# Patient Record
Sex: Male | Born: 1955 | Race: White | Hispanic: No | Marital: Married | Smoking: Never smoker
Health system: Southern US, Community
[De-identification: ages and names within clinical notes are randomized; demographics above are authoritative.]

## PROBLEM LIST (undated history)

## (undated) DIAGNOSIS — C801 Malignant (primary) neoplasm, unspecified: Secondary | ICD-10-CM

## (undated) DIAGNOSIS — I8390 Asymptomatic varicose veins of unspecified lower extremity: Secondary | ICD-10-CM

## (undated) DIAGNOSIS — E1139 Type 2 diabetes mellitus with other diabetic ophthalmic complication: Secondary | ICD-10-CM

## (undated) DIAGNOSIS — M6530 Trigger finger, unspecified finger: Secondary | ICD-10-CM

## (undated) DIAGNOSIS — E785 Hyperlipidemia, unspecified: Secondary | ICD-10-CM

## (undated) DIAGNOSIS — E109 Type 1 diabetes mellitus without complications: Secondary | ICD-10-CM

## (undated) DIAGNOSIS — B977 Papillomavirus as the cause of diseases classified elsewhere: Secondary | ICD-10-CM

## (undated) DIAGNOSIS — Z87891 Personal history of nicotine dependence: Secondary | ICD-10-CM

## (undated) DIAGNOSIS — K828 Other specified diseases of gallbladder: Secondary | ICD-10-CM

## (undated) HISTORY — DX: Type 1 diabetes mellitus without complications: E10.9

## (undated) HISTORY — DX: Other specified diseases of gallbladder: K82.8

## (undated) HISTORY — PX: SHOULDER SURGERY: SHX246

## (undated) HISTORY — DX: Personal history of nicotine dependence: Z87.891

## (undated) HISTORY — PX: CHOLECYSTECTOMY: SHX55

## (undated) HISTORY — PX: VITRECTOMY: SHX106

## (undated) HISTORY — DX: Hyperlipidemia, unspecified: E78.5

## (undated) HISTORY — DX: Asymptomatic varicose veins of unspecified lower extremity: I83.90

## (undated) HISTORY — DX: Papillomavirus as the cause of diseases classified elsewhere: B97.7

## (undated) HISTORY — DX: Malignant (primary) neoplasm, unspecified: C80.1

## (undated) HISTORY — PX: KNEE ARTHROSCOPY: SUR90

## (undated) HISTORY — DX: Trigger finger, unspecified finger: M65.30

## (undated) HISTORY — PX: VASECTOMY: SHX75

## (undated) HISTORY — DX: Type 2 diabetes mellitus with other diabetic ophthalmic complication: E11.39

---

## 1999-02-24 ENCOUNTER — Ambulatory Visit (HOSPITAL_COMMUNITY): Admission: RE | Admit: 1999-02-24 | Discharge: 1999-02-24 | Payer: Self-pay | Admitting: Gastroenterology

## 1999-02-24 ENCOUNTER — Encounter: Payer: Self-pay | Admitting: Gastroenterology

## 1999-07-24 ENCOUNTER — Ambulatory Visit (HOSPITAL_BASED_OUTPATIENT_CLINIC_OR_DEPARTMENT_OTHER): Admission: RE | Admit: 1999-07-24 | Discharge: 1999-07-24 | Payer: Self-pay | Admitting: Orthopaedic Surgery

## 2004-05-04 ENCOUNTER — Ambulatory Visit: Payer: Self-pay | Admitting: Endocrinology

## 2004-05-08 ENCOUNTER — Ambulatory Visit: Payer: Self-pay | Admitting: Endocrinology

## 2005-02-05 ENCOUNTER — Ambulatory Visit: Payer: Self-pay | Admitting: Endocrinology

## 2005-03-01 ENCOUNTER — Ambulatory Visit: Payer: Self-pay | Admitting: Internal Medicine

## 2005-03-14 ENCOUNTER — Emergency Department (HOSPITAL_COMMUNITY): Admission: EM | Admit: 2005-03-14 | Discharge: 2005-03-14 | Payer: Self-pay | Admitting: Family Medicine

## 2005-06-02 ENCOUNTER — Ambulatory Visit: Payer: Self-pay | Admitting: Internal Medicine

## 2005-06-11 ENCOUNTER — Ambulatory Visit: Payer: Self-pay | Admitting: Endocrinology

## 2005-07-19 ENCOUNTER — Ambulatory Visit: Payer: Self-pay | Admitting: Endocrinology

## 2006-02-25 ENCOUNTER — Ambulatory Visit: Payer: Self-pay | Admitting: Endocrinology

## 2006-06-03 ENCOUNTER — Ambulatory Visit: Payer: Self-pay | Admitting: Endocrinology

## 2006-06-03 LAB — CONVERTED CEMR LAB: Hgb A1c MFr Bld: 9.8 % — ABNORMAL HIGH (ref 4.6–6.0)

## 2006-07-12 ENCOUNTER — Ambulatory Visit: Payer: Self-pay | Admitting: Endocrinology

## 2006-08-30 ENCOUNTER — Ambulatory Visit: Payer: Self-pay | Admitting: Endocrinology

## 2006-08-30 LAB — CONVERTED CEMR LAB
ALT: 19 units/L (ref 0–53)
AST: 16 units/L (ref 0–37)
Albumin: 3.5 g/dL (ref 3.5–5.2)
Alkaline Phosphatase: 56 units/L (ref 39–117)
BUN: 12 mg/dL (ref 6–23)
Basophils Absolute: 0 10*3/uL (ref 0.0–0.1)
Basophils Relative: 0.5 % (ref 0.0–1.0)
Bilirubin Urine: NEGATIVE
Bilirubin, Direct: 0.2 mg/dL (ref 0.0–0.3)
CO2: 28 meq/L (ref 19–32)
Calcium: 8.7 mg/dL (ref 8.4–10.5)
Chloride: 108 meq/L (ref 96–112)
Cholesterol: 190 mg/dL (ref 0–200)
Creatinine, Ser: 0.8 mg/dL (ref 0.4–1.5)
Creatinine,U: 214.4 mg/dL
Eosinophils Absolute: 0.3 10*3/uL (ref 0.0–0.6)
Eosinophils Relative: 5.3 % — ABNORMAL HIGH (ref 0.0–5.0)
GFR calc Af Amer: 133 mL/min
GFR calc non Af Amer: 110 mL/min
Glucose, Bld: 198 mg/dL — ABNORMAL HIGH (ref 70–99)
HCT: 42.2 % (ref 39.0–52.0)
HDL: 55 mg/dL (ref >39.0)
Hemoglobin, Urine: NEGATIVE
Hemoglobin: 14.6 g/dL (ref 13.0–17.0)
Hgb A1c MFr Bld: 8.2 % — ABNORMAL HIGH (ref 4.6–6.0)
LDL Cholesterol: 127 mg/dL — ABNORMAL HIGH (ref 0–99)
Leukocytes, UA: NEGATIVE
Lymphocytes Relative: 32.4 % (ref 12.0–46.0)
MCHC: 34.6 g/dL (ref 30.0–36.0)
MCV: 90.9 fL (ref 78.0–100.0)
Microalb Creat Ratio: 2.8 mg/g (ref 0.0–30.0)
Microalb, Ur: 0.6 mg/dL (ref 0.0–1.9)
Monocytes Absolute: 0.4 10*3/uL (ref 0.2–0.7)
Monocytes Relative: 7 % (ref 3.0–11.0)
Neutro Abs: 3.3 10*3/uL (ref 1.4–7.7)
Neutrophils Relative %: 54.8 % (ref 43.0–77.0)
Nitrite: NEGATIVE
PSA: 1.48 ng/mL (ref 0.10–4.00)
Platelets: 270 10*3/uL (ref 150–400)
Potassium: 3.6 meq/L (ref 3.5–5.1)
RBC: 4.64 M/uL (ref 4.22–5.81)
RDW: 12.8 % (ref 11.5–14.6)
Sodium: 141 meq/L (ref 135–145)
Specific Gravity, Urine: 1.025 (ref 1.000–1.03)
TSH: 1.33 microintl units/mL (ref 0.35–5.50)
Total Bilirubin: 1.1 mg/dL (ref 0.3–1.2)
Total CHOL/HDL Ratio: 3.5
Total Protein, Urine: NEGATIVE mg/dL
Total Protein: 6.2 g/dL (ref 6.0–8.3)
Triglycerides: 40 mg/dL (ref 0–149)
Urine Glucose: >=1000 mg/dL — CR
Urobilinogen, UA: 0.2 (ref 0.0–1.0)
VLDL: 8 mg/dL (ref 0–40)
WBC: 5.9 10*3/uL (ref 4.5–10.5)
pH: 6 (ref 5.0–8.0)

## 2006-09-21 ENCOUNTER — Ambulatory Visit: Payer: Self-pay | Admitting: Endocrinology

## 2006-11-17 ENCOUNTER — Encounter: Payer: Self-pay | Admitting: Endocrinology

## 2006-11-17 DIAGNOSIS — B977 Papillomavirus as the cause of diseases classified elsewhere: Secondary | ICD-10-CM

## 2006-11-17 DIAGNOSIS — K828 Other specified diseases of gallbladder: Secondary | ICD-10-CM

## 2006-11-17 DIAGNOSIS — Z87891 Personal history of nicotine dependence: Secondary | ICD-10-CM

## 2006-11-17 DIAGNOSIS — I8390 Asymptomatic varicose veins of unspecified lower extremity: Secondary | ICD-10-CM

## 2006-11-17 DIAGNOSIS — Z9889 Other specified postprocedural states: Secondary | ICD-10-CM | POA: Insufficient documentation

## 2006-11-17 DIAGNOSIS — E1139 Type 2 diabetes mellitus with other diabetic ophthalmic complication: Secondary | ICD-10-CM

## 2006-11-17 DIAGNOSIS — E785 Hyperlipidemia, unspecified: Secondary | ICD-10-CM | POA: Insufficient documentation

## 2006-11-17 DIAGNOSIS — E109 Type 1 diabetes mellitus without complications: Secondary | ICD-10-CM

## 2006-11-17 HISTORY — DX: Asymptomatic varicose veins of unspecified lower extremity: I83.90

## 2006-11-17 HISTORY — DX: Other specified diseases of gallbladder: K82.8

## 2006-11-17 HISTORY — DX: Papillomavirus as the cause of diseases classified elsewhere: B97.7

## 2006-11-17 HISTORY — DX: Type 2 diabetes mellitus with other diabetic ophthalmic complication: E11.39

## 2006-11-17 HISTORY — DX: Hyperlipidemia, unspecified: E78.5

## 2006-11-17 HISTORY — DX: Type 1 diabetes mellitus without complications: E10.9

## 2006-11-17 HISTORY — DX: Personal history of nicotine dependence: Z87.891

## 2006-11-18 ENCOUNTER — Ambulatory Visit: Payer: Self-pay | Admitting: Endocrinology

## 2006-11-18 LAB — CONVERTED CEMR LAB
ALT: 16 units/L (ref 0–53)
AST: 17 units/L (ref 0–37)
Albumin: 3.7 g/dL (ref 3.5–5.2)
Alkaline Phosphatase: 52 units/L (ref 39–117)
Bilirubin, Direct: 0.2 mg/dL (ref 0.0–0.3)
Cholesterol: 140 mg/dL (ref 0–200)
HDL: 54.7 mg/dL (ref >39.0)
Hgb A1c MFr Bld: 7.9 % — ABNORMAL HIGH (ref 4.6–6.0)
LDL Cholesterol: 80 mg/dL (ref 0–99)
Total Bilirubin: 0.9 mg/dL (ref 0.3–1.2)
Total CHOL/HDL Ratio: 2.6
Total Protein: 6.6 g/dL (ref 6.0–8.3)
Triglycerides: 28 mg/dL (ref 0–149)
VLDL: 6 mg/dL (ref 0–40)

## 2006-11-22 LAB — CONVERTED CEMR LAB: Hep B S Ab: NEGATIVE

## 2006-11-23 ENCOUNTER — Telehealth (INDEPENDENT_AMBULATORY_CARE_PROVIDER_SITE_OTHER): Payer: Self-pay | Admitting: *Deleted

## 2006-11-23 ENCOUNTER — Encounter: Payer: Self-pay | Admitting: Endocrinology

## 2007-01-03 ENCOUNTER — Telehealth: Payer: Self-pay | Admitting: Internal Medicine

## 2007-01-19 HISTORY — PX: VITRECTOMY: SHX106

## 2007-03-03 ENCOUNTER — Encounter: Payer: Self-pay | Admitting: Endocrinology

## 2007-03-17 ENCOUNTER — Telehealth (INDEPENDENT_AMBULATORY_CARE_PROVIDER_SITE_OTHER): Payer: Self-pay | Admitting: *Deleted

## 2007-05-19 ENCOUNTER — Ambulatory Visit: Payer: Self-pay | Admitting: Endocrinology

## 2007-05-19 LAB — CONVERTED CEMR LAB: Hgb A1c MFr Bld: 8.5 % — ABNORMAL HIGH (ref 4.6–6.0)

## 2007-07-03 ENCOUNTER — Telehealth: Payer: Self-pay | Admitting: Endocrinology

## 2007-07-19 ENCOUNTER — Telehealth (INDEPENDENT_AMBULATORY_CARE_PROVIDER_SITE_OTHER): Payer: Self-pay | Admitting: *Deleted

## 2007-08-14 ENCOUNTER — Ambulatory Visit: Payer: Self-pay | Admitting: Endocrinology

## 2007-08-18 LAB — CONVERTED CEMR LAB: Hgb A1c MFr Bld: 8.1 % — ABNORMAL HIGH (ref 4.6–6.0)

## 2007-08-21 ENCOUNTER — Ambulatory Visit: Payer: Self-pay | Admitting: Endocrinology

## 2007-10-04 ENCOUNTER — Encounter: Payer: Self-pay | Admitting: Endocrinology

## 2007-10-06 ENCOUNTER — Telehealth: Payer: Self-pay | Admitting: Endocrinology

## 2007-11-10 ENCOUNTER — Ambulatory Visit: Payer: Self-pay | Admitting: Endocrinology

## 2007-11-20 ENCOUNTER — Encounter: Payer: Self-pay | Admitting: Endocrinology

## 2007-11-23 ENCOUNTER — Encounter: Payer: Self-pay | Admitting: Endocrinology

## 2007-12-20 ENCOUNTER — Encounter: Payer: Self-pay | Admitting: Endocrinology

## 2007-12-22 ENCOUNTER — Ambulatory Visit: Payer: Self-pay | Admitting: Endocrinology

## 2007-12-22 LAB — CONVERTED CEMR LAB: Hgb A1c MFr Bld: 8.4 % — ABNORMAL HIGH (ref 4.6–6.0)

## 2008-06-18 ENCOUNTER — Ambulatory Visit: Payer: Self-pay | Admitting: Endocrinology

## 2008-06-18 LAB — CONVERTED CEMR LAB
Creatinine,U: 203.9 mg/dL
Hgb A1c MFr Bld: 8.3 % — ABNORMAL HIGH (ref 4.6–6.5)
Microalb Creat Ratio: 2.5 mg/g (ref 0.0–30.0)
Microalb, Ur: 0.5 mg/dL (ref 0.0–1.9)

## 2008-06-24 ENCOUNTER — Ambulatory Visit: Payer: Self-pay | Admitting: Endocrinology

## 2008-07-29 ENCOUNTER — Telehealth: Payer: Self-pay | Admitting: Endocrinology

## 2008-11-07 ENCOUNTER — Encounter: Payer: Self-pay | Admitting: Endocrinology

## 2009-01-27 ENCOUNTER — Telehealth: Payer: Self-pay | Admitting: Endocrinology

## 2009-02-21 ENCOUNTER — Ambulatory Visit: Payer: Self-pay | Admitting: Endocrinology

## 2009-02-21 ENCOUNTER — Telehealth (INDEPENDENT_AMBULATORY_CARE_PROVIDER_SITE_OTHER): Payer: Self-pay | Admitting: *Deleted

## 2009-02-24 ENCOUNTER — Telehealth (INDEPENDENT_AMBULATORY_CARE_PROVIDER_SITE_OTHER): Payer: Self-pay | Admitting: *Deleted

## 2009-04-07 ENCOUNTER — Encounter (INDEPENDENT_AMBULATORY_CARE_PROVIDER_SITE_OTHER): Payer: Self-pay | Admitting: *Deleted

## 2009-05-27 ENCOUNTER — Encounter (INDEPENDENT_AMBULATORY_CARE_PROVIDER_SITE_OTHER): Payer: Self-pay | Admitting: *Deleted

## 2009-05-29 ENCOUNTER — Encounter (INDEPENDENT_AMBULATORY_CARE_PROVIDER_SITE_OTHER): Payer: Self-pay | Admitting: *Deleted

## 2009-05-29 ENCOUNTER — Ambulatory Visit: Payer: Self-pay | Admitting: Gastroenterology

## 2009-06-10 ENCOUNTER — Ambulatory Visit: Payer: Self-pay | Admitting: Gastroenterology

## 2009-06-10 LAB — HM COLONOSCOPY

## 2009-06-12 ENCOUNTER — Encounter: Payer: Self-pay | Admitting: Gastroenterology

## 2009-06-23 ENCOUNTER — Ambulatory Visit: Payer: Self-pay | Admitting: Endocrinology

## 2009-10-09 ENCOUNTER — Encounter: Payer: Self-pay | Admitting: Endocrinology

## 2009-10-10 ENCOUNTER — Ambulatory Visit: Payer: Self-pay | Admitting: Endocrinology

## 2009-10-10 LAB — CONVERTED CEMR LAB: Hgb A1c MFr Bld: 8.5 % — ABNORMAL HIGH (ref 4.6–6.5)

## 2009-10-15 ENCOUNTER — Telehealth (INDEPENDENT_AMBULATORY_CARE_PROVIDER_SITE_OTHER): Payer: Self-pay | Admitting: *Deleted

## 2009-10-16 ENCOUNTER — Encounter: Payer: Self-pay | Admitting: Endocrinology

## 2009-10-17 ENCOUNTER — Telehealth: Payer: Self-pay | Admitting: Endocrinology

## 2009-12-23 ENCOUNTER — Telehealth: Payer: Self-pay | Admitting: Endocrinology

## 2010-01-06 ENCOUNTER — Telehealth: Payer: Self-pay | Admitting: Endocrinology

## 2010-01-14 ENCOUNTER — Telehealth: Payer: Self-pay | Admitting: Endocrinology

## 2010-02-13 ENCOUNTER — Ambulatory Visit: Admit: 2010-02-13 | Payer: Self-pay | Admitting: Endocrinology

## 2010-02-15 LAB — CONVERTED CEMR LAB
ALT: 15 units/L (ref 0–53)
AST: 15 units/L (ref 0–37)
Albumin: 3.8 g/dL (ref 3.5–5.2)
Alkaline Phosphatase: 68 units/L (ref 39–117)
BUN: 16 mg/dL (ref 6–23)
Basophils Absolute: 0 10*3/uL (ref 0.0–0.1)
Basophils Relative: 0.9 % (ref 0.0–3.0)
Bilirubin Urine: NEGATIVE
Bilirubin, Direct: 0.2 mg/dL (ref 0.0–0.3)
CO2: 32 meq/L (ref 19–32)
Calcium: 9 mg/dL (ref 8.4–10.5)
Chloride: 107 meq/L (ref 96–112)
Cholesterol: 203 mg/dL — ABNORMAL HIGH (ref 0–200)
Creatinine, Ser: 0.8 mg/dL (ref 0.4–1.5)
Direct LDL: 126.3 mg/dL
Eosinophils Absolute: 0.2 10*3/uL (ref 0.0–0.7)
Eosinophils Relative: 4.4 % (ref 0.0–5.0)
GFR calc non Af Amer: 108.18 mL/min (ref >60)
Glucose, Bld: 150 mg/dL — ABNORMAL HIGH (ref 70–99)
HCT: 46.3 % (ref 39.0–52.0)
HDL: 67 mg/dL (ref >39.00)
Hemoglobin, Urine: NEGATIVE
Hemoglobin: 15.2 g/dL (ref 13.0–17.0)
Hgb A1c MFr Bld: 8.6 % — ABNORMAL HIGH (ref 4.6–6.5)
Ketones, ur: NEGATIVE mg/dL
Leukocytes, UA: NEGATIVE
Lymphocytes Relative: 34 % (ref 12.0–46.0)
Lymphs Abs: 1.8 10*3/uL (ref 0.7–4.0)
MCHC: 32.8 g/dL (ref 30.0–36.0)
MCV: 95.4 fL (ref 78.0–100.0)
Monocytes Absolute: 0.4 10*3/uL (ref 0.1–1.0)
Monocytes Relative: 7.2 % (ref 3.0–12.0)
Neutro Abs: 2.8 10*3/uL (ref 1.4–7.7)
Neutrophils Relative %: 53.5 % (ref 43.0–77.0)
Nitrite: NEGATIVE
PSA: 2.49 ng/mL (ref 0.10–4.00)
Platelets: 235 10*3/uL (ref 150.0–400.0)
Potassium: 4.3 meq/L (ref 3.5–5.1)
RBC: 4.86 M/uL (ref 4.22–5.81)
RDW: 12.8 % (ref 11.5–14.6)
Sodium: 144 meq/L (ref 135–145)
Specific Gravity, Urine: 1.015 (ref 1.000–1.030)
TSH: 1.34 microintl units/mL (ref 0.35–5.50)
Total Bilirubin: 1.1 mg/dL (ref 0.3–1.2)
Total CHOL/HDL Ratio: 3
Total Protein: 6.7 g/dL (ref 6.0–8.3)
Triglycerides: 49 mg/dL (ref 0.0–149.0)
Urine Glucose: 500 mg/dL
Urobilinogen, UA: 0.2 (ref 0.0–1.0)
VLDL: 9.8 mg/dL (ref 0.0–40.0)
WBC: 5.2 10*3/uL (ref 4.5–10.5)
pH: 8.5 (ref 5.0–8.0)

## 2010-02-19 NOTE — Letter (Signed)
Summary: Patient Notice- Polyp Results  [facility]  [address] [place], [place] [address]   Phone: [phone]  Fax: [phone]        Jun 12, 2009 MRN: [id number]    UNDREA SHIPES [address] [place], [place]  [address]    Dear Jonny Ruiz,  I am pleased to inform you that the colon polyp(s) removed during your recent colonoscopy was (were) found to be benign (no cancer detected) upon pathologic examination.  I recommend you have a repeat colonoscopy examination in 5 years to look for recurrent polyps, as having colon polyps increases your risk for having recurrent polyps or even colon cancer in the future.  Should you develop new or worsening symptoms of abdominal pain, bowel habit changes or bleeding from the rectum or bowels, please schedule an evaluation with either your primary care physician or with me.  Continue treatment plan as outlined the day of your exam.  Please call us if you are having persistent problems or have questions about your condition that have not been fully answered at this time.  Sincerely,  Meryl Dare MD [facility]  This letter has been electronically signed by your physician.  Appended Document: Patient Notice- Polyp Results letter mailed.

## 2010-02-19 NOTE — Progress Notes (Signed)
Summary: rx refil req  Phone Note Call from Patient Call back at Work Phone [phone]   Caller: Patient Reason for Call: Refill Medication Summary of Call: Pt is requesting rx for Humalog be sent to [facility]-3 month supply-pt is running out of samples Initial call taken by: Brenton Grills CMA Duncan Dull),  December 23, 2009 11:59 AM  Follow-up for Phone Call        pt informed  Follow-up by: Brenton Grills CMA Duncan Dull),  December 23, 2009 12:03 PM    Prescriptions: HUMALOG 100 UNIT/ML SOLN (INSULIN LISPRO (HUMAN)) 5 units three times a day (qac)  #3 month x 1   Entered by:   Brenton Grills CMA (AAMA)   Authorized by:   Minus Breeding MD   Signed by:   Brenton Grills CMA (AAMA) on 12/23/2009   Method used:   Electronically to        [facility]* (retail)       [address] [place], [place]  [address]       Ph: [phone]       Fax: [phone]   RxID:   [id number]

## 2010-02-19 NOTE — Medication Information (Signed)
Summary: Prior auth & denied for Pravastatin/Humana  Prior auth & denied for Pravastatin/Humana   Imported By: Sherian Rein 10/21/2009 08:09:54  _____________________________________________________________________  External Attachment:    Type:   Image     Comment:   External Document

## 2010-02-19 NOTE — Letter (Signed)
Summary: Diabetic Instructions  [facility]  [address] [place], [place] [address]   Phone: [phone]  Fax: [phone]    Donald Lopez [birth year] MRN: [id number]     ________________________________________________________________________  _  _   INSULIN (LONG ACTING) MEDICATION INSTRUCTIONS (Lantus, NPH, 70/30, Humulin, Novolin-N)   The day before your procedure:   Take  your regular evening dose    The day of your procedure:   Do not take your morning dose    _  _   INSULIN (SHORT ACTING) MEDICATION INSTRUCTIONS (Regular, Humulog, Novolog)   The day before your procedure:   Do not take your evening dose   The day of your procedure:   Do not take your morning dose

## 2010-02-19 NOTE — Assessment & Plan Note (Signed)
Summary: 3 MTH FU  STC   Vital Signs:  Patient profile:   55 year old male Height:      68 inches (172.72 cm) Weight:      181.8 pounds (82.64 kg) BMI:     27.74 O2 Sat:      97 % on Room air Temp:     97.5 degrees F (36.39 degrees C) oral Pulse rate:   94 / minute BP sitting:   118 / 68  (left arm) Cuff size:   regular  Vitals Entered By: Orlan Leavens (June 23, 2009 8:14 AM)  O2 Flow:  Room air CC: 3 month follow-up Is Patient Diabetic? Yes Did you bring your meter with you today? No Pain Assessment Patient in pain? no        CC:  3 month follow-up.  History of Present Illness: no cbg record, but states he has to eat hs-snack in order to prevent nocturnal hypoglycemia.  pt states he feels well in general.   he does not want to do a1c now, due to cost.  he says he will pursue continuous glucose monitor soon.  Current Medications (verified): 1)  Humalog 100 Unit/ml Soln (Insulin Lispro (Human)) .... 5 Units Three Times A Day (Qac) 2)  Accu-Chek Aviva   Strp (Glucose Blood) .... Use To Check Blood Sugars 5 Times A Day, and Lancets 250.03 3)  Aspirin 325 Mg Tabs (Aspirin) .... Take 1 By Mouth Qd 4)  Lantus 100 Unit/ml Soln (Insulin Glargine) .Marland Kitchen.. 18 Units Qhs  Allergies (verified): 1)  ! 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    Physical Exam  General:  normal appearance.   Pulses:  dorsalis pedis intact bilat.    Extremities:  no deformity.  no ulcer on the feet.  feet are of normal color and temp.  at the left ankle, there is rust-color, and varicosities.  trace left pedal edema (none on the right).   Neurologic:   sensation is intact to touch on the feet    Impression & Recommendations:  Problem # 1:  DIABETES MELLITUS, TYPE I (ICD-250.01) uncertain control.  Medications Added to Medication List This Visit: 1)  Accu-chek Aviva Strp (Glucose blood) .... 5 times a day, and lancets 250.03 2)  Lantus 100 Unit/ml Soln (Insulin glargine) .Marland KitchenMarland KitchenMarland Kitchen 17 units at bedtime  Other Orders: Est. Patient Level III ([address])  Patient Instructions: 1)  reduce the lantus to 17 units at bedtime.   2)  return 3 months. Prescriptions: ACCU-CHEK AVIVA   STRP (GLUCOSE BLOOD) 5 times a day, and lancets 250.03  #450 x 3   Entered and Authorized by:   Minus Breeding MD   Signed by:   Minus Breeding MD on 06/23/2009   Method used:   Print then Give to Patient   RxID:   [id number]

## 2010-02-19 NOTE — Assessment & Plan Note (Signed)
Summary: 3 MO FU-OYU  #   Vital Signs:  Patient profile:   55 year old male Height:      68 inches (172.72 cm) Weight:      191 pounds (86.82 kg) BMI:     29.15 O2 Sat:      97 % on Room air Temp:     97.6 degrees F (36.44 degrees C) oral Pulse rate:   70 / minute BP sitting:   110 / 70  (left arm) Cuff size:   large  Vitals Entered By: Brenton Grills MA (October 10, 2009 8:58 AM)  O2 Flow:  Room air CC: 3 month F/U/aj Is Patient Diabetic? Yes   CC:  3 month F/U/aj.  History of Present Illness: no cbg record, but states he has to eat hs-snack in order to prevent nocturnal hypoglycemia.  pt states he feels well in general.   he does not want to do a1c now, due to cost.  he says he will pursue continuous glucose monitor soon.    Current Medications (verified): 1)  Humalog 100 Unit/ml Soln (Insulin Lispro (Human)) .... 5 Units Three Times A Day (Qac) 2)  Accu-Chek Aviva   Strp (Glucose Blood) .... 5 Times A Day, and Lancets 250.03 3)  Aspirin 325 Mg Tabs (Aspirin) .... Take 1 By Mouth Qd 4)  Lantus 100 Unit/ml Soln (Insulin Glargine) .Marland KitchenMarland Kitchen. 17 Units At Bedtime  Allergies (verified): 1)  ! 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    Physical Exam  Pulses:  dorsalis pedis intact bilat.    Extremities:  no deformity.  no ulcer on the feet.  feet are of normal color and temp.  at the left ankle, there is rust-color, and varicosities.  trace left pedal edema (none on the right).   Neurologic:  sensation is intact to touch on the feet  Additional Exam:  Hemoglobin A1C       [H]  8.5 %        Impression &  Recommendations:  Problem # 1:  DIABETES MELLITUS, TYPE I (ICD-250.01) needs increased rx  Problem # 2:  HYPERLIPIDEMIA (ICD-272.4) needs increased rx  LDL: 80 (11/18/2006)     Medications Added to Medication List This Visit: 1)  Pravastatin Sodium 40 Mg Tabs (Pravastatin sodium) .... 2 tabs once daily 2)  Bd Insulin Syringe Ultrafine 30g X 1/2" 0.3 Ml Misc (Insulin syringe-needle u-100) .... 4x a day  Other Orders: Admin 1st Vaccine ([address]) Flu Vaccine 2yrs + ([address]) Diabetic Clinic Referral (Diabetic) TLB-A1C / Hgb A1C (Glycohemoglobin) (83036-A1C) Est. Patient Level III ([address])  Patient Instructions: 1)  take pravastatin 2x40 mg once daily. 2)  refer diabetes educator, to consider continuous monitor.  you will be called with a day and time for an appointment. 3)  Please schedule a follow-up appointment in 4 months, or when you have the continuous monitor. 4)  blood tests are being ordered for you today.  please call [phone] to hear your test results. 5)  (update: i left message on phone-tree:  rx as we discussed) Prescriptions: PRAVASTATIN SODIUM 40 MG TABS (PRAVASTATIN SODIUM) 2 tabs once daily  #180 x 3   Entered and Authorized by:   Minus Breeding MD  Signed by:   Minus Breeding MD on 10/10/2009   Method used:   Print then Give to Patient   RxID:   [id number] BD INSULIN SYRINGE ULTRAFINE 30G X 1/2" 0.3 ML MISC (INSULIN SYRINGE-NEEDLE U-100) 4x a day  #120 x 11   Entered and Authorized by:   Minus Breeding MD   Signed by:   Minus Breeding MD on 10/10/2009   Method used:   Electronically to        [facility]* (retail)       [address]       [place], [place]  [address]       Ph: [phone]       Fax: [phone]   RxID:   [id number] PRAVASTATIN SODIUM 40 MG TABS (PRAVASTATIN SODIUM) 2 tabs once daily  #180 x 3   Entered and Authorized by:   Minus Breeding MD   Signed by:   Minus Breeding MD on 10/10/2009   Method used:    Electronically to        [facility]* (retail)       [address] [place], [place]  [address]       Ph: [phone]       Fax: [phone]   RxID:   [id number]   Flu Vaccine Consent Questions     Do you have a history of severe allergic reactions to this vaccine? no    Any prior history of allergic reactions to egg and/or gelatin? no    Do you have a sensitivity to the preservative Thimersol? no    Do you have a past history of Guillan-Barre Syndrome? no    Do you currently have an acute febrile illness? no    Have you ever had a severe reaction to latex? no    Vaccine information given and explained to patient? yes    Are you currently pregnant? no    Lot Number:[id number]   Exp Date:07/18/2010   Site Given  Right Deltoid IMlu

## 2010-02-19 NOTE — Progress Notes (Signed)
Summary: Rx clarification  Phone Note Call from Patient Call back at Home Phone [phone]   Caller: Patient Summary of Call: Pt called stating that mail order pharmacy is holding his Rx until MD clarifies: Pravastain 40mg  two times a day, once daily or 80mg  once daily. Please advise Initial call taken by: Margaret Pyle, CMA,  October 17, 2009 1:12 PM  Follow-up for Phone Call        whichever is cheaper Follow-up by: Minus Breeding MD,  October 17, 2009 1:23 PM  Additional Follow-up for Phone Call Additional follow up Details #1::        Per pt all generics are the same price, please advise 40mg  once daily, two times a day or 80mg  once daily  Additional Follow-up by: Margaret Pyle, CMA,  October 17, 2009 1:33 PM    Additional Follow-up for Phone Call Additional follow up Details #2::    the 40 mg is a $4 at [employer], with or without insurance.  is the generic copay less than $8? Follow-up by: Minus Breeding MD,  October 17, 2009 1:40 PM  Additional Follow-up for Phone Call Additional follow up Details #3:: Details for Additional Follow-up Action Taken: Pt gets his Rx's from [facility] and per pt he was advised that all 3 options they listed were the same price. Pharmacy is requesting clarification on dosage and frequency. Margaret Pyle, CMA  October 17, 2009 1:56 PM   New/Updated Medications: PRAVASTATIN SODIUM 80 MG TABS (PRAVASTATIN SODIUM) 1 tab at bedtime Prescriptions: PRAVASTATIN SODIUM 80 MG TABS (PRAVASTATIN SODIUM) 1 tab at bedtime  #90 x 3   Entered and Authorized by:   Minus Breeding MD   Signed by:   Minus Breeding MD on 10/17/2009   Method used:   Electronically to        [facility]* (retail)       [address] [place], [place]  [address]       Ph: [phone]       Fax: [phone]   RxID:   [id number]  i sent rx sean ellison, md  Pt informed. Margaret Pyle, CMA  October 17, 2009 2:21 PM

## 2010-02-19 NOTE — Miscellaneous (Signed)
Summary: [facility]  Clinical Lists Changes  Medications: Added new medication of MOVIPREP 100 GM  SOLR (PEG-KCL-NACL-NASULF-NA ASC-C) As per prep instructions. - Signed Rx of MOVIPREP 100 GM  SOLR (PEG-KCL-NACL-NASULF-NA ASC-C) As per prep instructions.;  #1 x 0;  Signed;  Entered by: Ezra Sites RN;  Authorized by: Meryl Dare MD Clementeen Graham;  Method used: Electronically to [facility]*, [address], [place], [place]  [address], Ph: [phone], Fax: [phone] Allergies: Added new allergy or adverse reaction of PCN Observations: Added new observation of NKA: F (05/29/2009 7:56)    Prescriptions: MOVIPREP 100 GM  SOLR (PEG-KCL-NACL-NASULF-NA ASC-C) As per prep instructions.  #1 x 0   Entered by:   Ezra Sites RN   Authorized by:   Meryl Dare MD [facility]   Signed by:   Ezra Sites RN on 05/29/2009   Method used:   Electronically to        [facility]* (retail)       [address]       [place], [place]  [address]       Ph: [phone]       Fax: [phone]   RxID:   [id number]

## 2010-02-19 NOTE — Consult Note (Signed)
Summary: [facility]   Imported By: Sherian Rein 10/15/2009 14:28:17  _____________________________________________________________________  External Attachment:    Type:   Image     Comment:   External Document

## 2010-02-19 NOTE — Procedures (Signed)
Summary: Colonoscopy  Patient: Donald Lopez Note: All result statuses are Final unless otherwise noted.  Tests: (1) Colonoscopy (COL)   COL Colonoscopy           DONE     [facility]     [address]     [place], [place]  [address]           COLONOSCOPY PROCEDURE REPORT           PATIENT:  [name], [name]  MR#:  [id number]     BIRTHDATE:  1955/05/12, 53 yrs. old  GENDER:  male     ENDOSCOPIST:  Judie Petit T. Russella Dar, MD, [facility]           PROCEDURE DATE:  06/10/2009     PROCEDURE:  Colonoscopy with biopsy and snare polypectomy     ASA CLASS:  Class II     INDICATIONS:  1) Routine Risk Screening     MEDICATIONS:   Fentanyl 75 mcg IV, Versed 8 mg IV     DESCRIPTION OF PROCEDURE:   After the risks benefits and     alternatives of the procedure were thoroughly explained, informed     consent was obtained.  Digital rectal exam was performed and     revealed no abnormalities.   The LB [id number] endoscope     was introduced through the anus and advanced to the cecum, which     was identified by both the appendix and ileocecal valve, without     limitations.  The quality of the prep was good, using MoviPrep.     The instrument was then slowly withdrawn as the colon was fully     examined.     <<PROCEDUREIMAGES>>     FINDINGS:  A sessile polyp was found in the cecum. It was 4 mm in     size. The polyp was removed using cold biopsy forceps.  A sessile     polyp was found in the sigmoid colon. It was 4 mm in size. The     polyp was removed using cold biopsy forceps.  A sessile polyp was     found in the sigmoid colon. It was 5 mm in size. Polyp was snared     without cautery. Retrieval was successful. A normal appearing     ileocecal valve, and appendiceal orifice were identified. The     ascending, hepatic flexure, transverse, splenic flexure,     descending, and rectum appeared unremarkable. Retroflexed views in     the rectum revealed no abnormalities. The time to  cecum =  3.25     minutes. The scope was then withdrawn (time =  13.75  min) from the     patient and the procedure completed.     COMPLICATIONS:  None           ENDOSCOPIC IMPRESSION:     1) 4 mm sessile polyp in the cecum     2) 4 mm sessile polyp in the sigmoid colon     3) 5 mm sessile polyp in the sigmoid colon     RECOMMENDATIONS:     1) Await pathology results     2) If the polyps removed today are adenomatous (pre-cancerous),     you will need a repeat colonoscopy in 5 years. Otherwise you     should continue to follow colorectal cancer screening guidelines     for "routine risk" patients with colonoscopy in 10 years.  Venita Lick. Russella Dar, MD, Clementeen Graham           CC: Minus Breeding, MD           n.     Rosalie DoctorVenita Lick. Stark at 06/10/2009 09:30 AM           Michelene Heady, [id number]  Note: An exclamation mark (!) indicates a result that was not dispersed into the flowsheet. Document Creation Date: 06/10/2009 9:31 AM _______________________________________________________________________  (1) Order result status: Final Collection or observation date-time: 06/10/2009 09:25 Requested date-time:  Receipt date-time:  Reported date-time:  Referring Physician:   Ordering Physician: Claudette Head [id number]) Specimen Source:  Source: Launa Grill Order Number: [id number] Lab site:   Appended Document: Colonoscopy     Procedures Next Due Date:    Colonoscopy: 05/2014

## 2010-02-19 NOTE — Progress Notes (Signed)
  Phone Note Refill Request Message from:  Patient on January 06, 2010 4:55 PM  Refills Requested: Medication #1:  HUMALOG 100 UNIT/ML SOLN 5 units three times a day (qac)   Dosage confirmed as above?Dosage Confirmed   Supply Requested: 3 months  Method Requested: Fax to [facility] Initial call taken by: Margaret Pyle, CMA,  January 06, 2010 4:55 PM    Prescriptions: HUMALOG 100 UNIT/ML SOLN (INSULIN LISPRO (HUMAN)) 5 units three times a day (qac)  #50month x 1   Entered by:   Margaret Pyle, CMA   Authorized by:   Minus Breeding MD   Signed by:   Margaret Pyle, CMA on 01/06/2010   Method used:   Faxed to ...       [facility]* (retail)       [address] [place], [place]  [address]       Ph: [phone]       Fax: [phone]   RxID:   [id number]

## 2010-02-19 NOTE — Progress Notes (Signed)
Summary: Referral  ---- Converted from flag ---- ---- 02/22/2009 1:58 PM, Minus Breeding MD wrote: please call patient: i reviewed paper chart, and you are due for colonoscopy. ------------------------------  Phone Note Outgoing Call   Summary of Call: Informed pt. He asked that MD do a referral for him to get his colonoscopy. Please advise? Initial call taken by: Josph Macho CMA,  February 24, 2009 8:41 AM  Follow-up for Phone Call        done Follow-up by: Minus Breeding MD,  February 24, 2009 8:44 AM  Additional Follow-up for Phone Call Additional follow up Details #1::        Informed pt that someone from [facility] will be contacting him. Additional Follow-up by: Josph Macho CMA,  February 24, 2009 8:49 AM

## 2010-02-19 NOTE — Letter (Signed)
Summary: Previsit letter  [facility]  [address] [place], [place] [address]   Phone: [phone]  Fax: [phone]       04/07/2009 MRN: [id number]  CARLIN ATTRIDGE [address] [place], [place]  [address]  Dear Mr. Cleckley,  Welcome to the Gastroenterology Division at [facility].    You are scheduled to see a nurse for your pre-procedure visit on 05/07/2009 at 8:00AM on the 3rd floor at [facility], [address]  We ask that you try to arrive at our office 15 minutes prior to your appointment time to allow for check-in.  Your nurse visit will consist of discussing your medical and surgical history, your immediate family medical history, and your medications.    Please bring a complete list of all your medications or, if you prefer, bring the medication bottles and we will list them.  We will need to be aware of both prescribed and over the counter drugs.  We will need to know exact dosage information as well.  If you are on blood thinners (Coumadin, Plavix, Aggrenox, Ticlid, etc.) please call our office today/prior to your appointment, as we need to consult with your physician about holding your medication.   Please be prepared to read and sign documents such as consent forms, a financial agreement, and acknowledgement forms.  If necessary, and with your consent, a friend or relative is welcome to sit-in on the nurse visit with you.  Please bring your insurance card so that we may make a copy of it.  If your insurance requires a referral to see a specialist, please bring your referral form from your primary care physician.  No co-pay is required for this nurse visit.     If you cannot keep your appointment, please call [phone] to cancel or reschedule prior to your appointment date.  This allows [place] the opportunity to schedule an appointment for another patient in need of care.    Thank you for choosing [facility] for your  medical needs.  We appreciate the opportunity to care for you.  Please visit [place] at our website  to learn more about our practice.                     Sincerely.                                                                                                                   The Gastroenterology Division

## 2010-02-19 NOTE — Progress Notes (Signed)
Summary: Rx refill req  Phone Note Refill Request Message from:  Patient on January 14, 2010 9:03 AM  Refills Requested: Medication #1:  LANTUS 100 UNIT/ML SOLN 17 units at bedtime   Dosage confirmed as above?Dosage Confirmed   Supply Requested: 6 months  Method Requested: Fax to [facility] Initial call taken by: Margaret Pyle, CMA,  January 14, 2010 9:04 AM    Prescriptions: LANTUS 100 UNIT/ML SOLN (INSULIN GLARGINE) 17 units at bedtime  #29mth x 2   Entered by:   Margaret Pyle, CMA   Authorized by:   Minus Breeding MD   Signed by:   Margaret Pyle, CMA on 01/14/2010   Method used:   Electronically to        [facility]* (retail)       [address] [place], [place]  [address]       Ph: [phone]       Fax: [phone]   RxID:   [id number] LANTUS 100 UNIT/ML SOLN (INSULIN GLARGINE) 17 units at bedtime  #35mth x 2   Entered by:   Margaret Pyle, CMA   Authorized by:   Minus Breeding MD   Signed by:   Margaret Pyle, CMA on 01/14/2010   Method used:   Faxed to ...       [facility]* (retail)       [address] [place], [place]  [address]       Ph: [phone]       Fax: [phone]   RxID:   [id number]

## 2010-02-19 NOTE — Assessment & Plan Note (Signed)
Summary: PHYSICAL--STC   Vital Signs:  Patient profile:   55 year old male Height:      68 inches (172.72 cm) Weight:      182.13 pounds (82.79 kg) O2 Sat:      97 % on Room air Temp:     97.4 degrees F (36.33 degrees C) oral Pulse rate:   80 / minute BP sitting:   110 / 64  (left arm) Cuff size:   large  Vitals Entered By: Josph Macho CMA (February 21, 2009 8:28 AM)  O2 Flow:  Room air CC: Physical/ CF Is Patient Diabetic? Yes   CC:  Physical/ CF.  History of Present Illness: here for regular wellness examination.  He's feeling pretty well in general.  no cbg record, but states cbg's are "the same."   Current Medications (verified): 1)  Humalog 100 Unit/ml Soln (Insulin Lispro (Human)) .... 5 Units Three Times A Day (Qac) 2)  Accu-Chek Aviva   Strp (Glucose Blood) .... Use To Check Blood Sugars 5 Times A Day, and Lancets 250.03 3)  Aspirin 325 Mg Tabs (Aspirin) .... Take 1 By Mouth Qd 4)  Novolin N 100 Unit/ml Susp (Insulin Isophane Human) .Marland Kitchen.. 14 Units Qhs 5)  Lantus 100 Unit/ml Soln (Insulin Glargine) .... 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 father: (deceased) lymphoma  Social History: Reviewed history and no changes required. married sells [employer] no tobacco alcohol is 5-6/week  Review of Systems  The patient denies fever, decreased hearing, chest pain, syncope, dyspnea on exertion, prolonged cough, headaches, abdominal pain, melena, hematochezia, severe indigestion/heartburn, hematuria, suspicious skin lesions, and  depression.         pt reports few lbs weight gain.  no recent visual changes.  denies decreased urinary stream  Physical Exam  General:  normal appearance.   Head:  head: no deformity eyes: no periorbital swelling, no proptosis external nose and ears are normal mouth: no lesion seen Neck:  Supple without thyroid enlargement or tenderness. No cervical lymphadenopathy, neck masses or tracheal deviation.  Lungs:  Clear to auscultation bilaterally. Normal respiratory effort.  Heart:  Regular rate and rhythm without murmurs or gallops noted. Normal S1,S2.   Abdomen:  abdomen is soft, nontender.  no hepatosplenomegaly.   not distended.  no hernia  Rectal:  normal external and internal exam.  heme neg  Prostate:  Normal size prostate without masses or tenderness.  Msk:  muscle bulk and strength are grossly normal.  no obvious joint swelling.  gait is normal and steady  Pulses:  dorsalis pedis intact bilat.  no carotid bruit  Extremities:  no deformity.  no ulcer on the feet.  feet are of normal color and temp.  at the left ankle, there is rust-color, and varicosities.   trace left pedal edema (none on the right).   Neurologic:  cn 2-12 grossly intact.   readily moves all 4's.   sensation is intact to touch on the feet  Skin:  normal texture and temp.  no rash.  not diaphoretic  Cervical Nodes:  No significant adenopathy.  Psych:  Alert and cooperative; normal mood and affect; normal attention span and  concentration.     Impression & Recommendations:  Problem # 1:  ROUTINE GENERAL MEDICAL EXAM@HEALTH  CARE FACL (ICD-V70.0)  Medications Added to Medication List This Visit: 1)  Lantus 100 Unit/ml Soln (Insulin glargine) .Marland Kitchen.. 18 units qhs  Other Orders: EKG w/ Interpretation (93000) Diabetic Clinic Referral (Diabetic) Est. Patient 40-64 years ([address])   Patient Instructions: 1)  pending the test results, please continue the same medications for now 2)  return 3 months, with a1c  prior 250.01

## 2010-02-19 NOTE — Progress Notes (Signed)
Summary: Paper chart  ---- Converted from flag ---- ---- 02/21/2009 10:57 AM, Minus Breeding MD wrote: paper chart please ?colonoscopy ------------------------------  Phone Note Other Incoming   Summary of Call: Ordered paper chart. Chart put on MD's desk Initial call taken by: Josph Macho CMA,  February 21, 2009 11:21 AM

## 2010-02-19 NOTE — Letter (Signed)
Summary: [facility] Instructions  [facility]  [address] [place], [place] [address]   Phone: [phone]  Fax: [phone]       Donald Lopez    07/12/1955    MRN: [id number]        Procedure Day Dorna Bloom:  Jake Shark  06/10/09     Arrival Time:  8:00am     Procedure Time:  9:00am     Location of Procedure:                    Juliann Pares  [facility] (4th Floor)                        PREPARATION FOR COLONOSCOPY WITH MOVIPREP   Starting 5 days prior to your procedure  THURSDAY 05/19 do not eat nuts, seeds, popcorn, corn, beans, peas,  salads, or any raw vegetables.  Do not take any fiber supplements (e.g. Metamucil, Citrucel, and Benefiber).  THE DAY BEFORE YOUR PROCEDURE         DATE: MONDAY  05/23   1.  Drink clear liquids the entire day-NO SOLID FOOD  2.  Do not drink anything colored red or purple.  Avoid juices with pulp.  No orange juice.  3.  Drink at least 64 oz. (8 glasses) of fluid/clear liquids during the day to prevent dehydration and help the prep work efficiently.  CLEAR LIQUIDS INCLUDE: Water Jello Ice Popsicles Tea (sugar ok, no milk/cream) Powdered fruit flavored drinks Coffee (sugar ok, no milk/cream) Gatorade Juice: apple, white grape, white cranberry  Lemonade Clear bullion, consomm, broth Carbonated beverages (any kind) Strained chicken noodle soup Hard Candy                             4.  In the morning, mix first dose of MoviPrep solution:    Empty 1 Pouch A and 1 Pouch B into the disposable container    Add lukewarm drinking water to the top line of the container. Mix to dissolve    Refrigerate (mixed solution should be used within 24 hrs)  5.  Begin drinking the prep at 5:00 p.m. The MoviPrep container is divided by 4 marks.   Every 15 minutes drink the solution down to the next mark (approximately 8 oz) until the full liter is complete.   6.  Follow completed prep with 16 oz of clear liquid of your choice  (Nothing red or purple).  Continue to drink clear liquids until bedtime.  7.  Before going to bed, mix second dose of MoviPrep solution:    Empty 1 Pouch A and 1 Pouch B into the disposable container    Add lukewarm drinking water to the top line of the container. Mix to dissolve    Refrigerate  THE DAY OF YOUR PROCEDURE      DATE: TUESDAY  05/24  Beginning at 4:00 a.m. (5 hours before procedure):         1. Every 15 minutes, drink the solution down to the next mark (approx 8 oz) until the full liter is complete.  2. Follow completed prep with 16 oz. of clear liquid of your choice.    3. You may drink clear liquids until 7:00am (2 HOURS BEFORE PROCEDURE).   MEDICATION INSTRUCTIONS  Unless otherwise instructed, you should take regular prescription medications with a small sip of water   as early as possible  the morning of your procedure.  Diabetic patients - see separate instructions.         OTHER INSTRUCTIONS  You will need a responsible adult at least 53 years of age to accompany you and drive you home.   This person must remain in the waiting room during your procedure.  Wear loose fitting clothing that is easily removed.  Leave jewelry and other valuables at home.  However, you may wish to bring a book to read or  an iPod/MP3 player to listen to music as you wait for your procedure to start.  Remove all body piercing jewelry and leave at home.  Total time from sign-in until discharge is approximately 2-3 hours.  You should go home directly after your procedure and rest.  You can resume normal activities the  day after your procedure.  The day of your procedure you should not:   Drive   Make legal decisions   Operate machinery   Drink alcohol   Return to work  You will receive specific instructions about eating, activities and medications before you leave.    The above instructions have been reviewed and explained to me by   Ezra Sites RN  May 29, 2009 8:18 AM    I fully understand and can verbalize these instructions _____________________________ Date _________

## 2010-02-19 NOTE — Progress Notes (Signed)
  Phone Note Refill Request Message from:  Fax from Pharmacy on January 27, 2009 4:12 PM  Refills Requested: Medication #1:  LANTUS 100 UNIT/ML SOLN take 18 units at bedtime.   Dosage confirmed as above?Dosage Confirmed Initial call taken by: Josph Macho CMA,  January 27, 2009 4:12 PM    Prescriptions: LANTUS 100 UNIT/ML SOLN (INSULIN GLARGINE) take 18 units at bedtime  #33ml x 3   Entered by:   Josph Macho CMA   Authorized by:   Minus Breeding MD   Signed by:   Josph Macho CMA on 01/27/2009   Method used:   Electronically to        [facility]* (retail)       [address]       [place], [place]  [id number]       Ph: [phone]       Fax: [phone]   RxID:   [id number]

## 2010-02-19 NOTE — Progress Notes (Signed)
Summary: PA Pravastatin  Phone Note Call from Patient   Caller: Patient Summary of Call: PA required for Pravastatin [phone] [facility] ID [id number]. Pt informed. Initial call taken by: Margaret Pyle, CMA,  October 15, 2009 3:34 PM  Follow-up for Phone Call        PA Faxed to [facility] @ [phone], awaiting approval. Dagoberto Reef  October 15, 2009 3:38 PM  Insurance denied Pravastatin does not meet [facility] medical necessity guidelines for coverage. Follow-up by: Dagoberto Reef,  October 16, 2009 3:48 PM  Additional Follow-up for Phone Call Additional follow up Details #1::        please call patient: this medication is very cheap.  you should buy it--same as a generic copay Additional Follow-up by: Minus Breeding MD,  October 16, 2009 5:40 PM    Additional Follow-up for Phone Call Additional follow up Details #2::    Pt informed. Follow-up by: Dagoberto Reef,  October 17, 2009 11:05 AM

## 2010-03-13 ENCOUNTER — Ambulatory Visit: Payer: Self-pay | Admitting: Endocrinology

## 2010-04-06 LAB — GLUCOSE, CAPILLARY

## 2010-04-19 NOTE — Progress Notes (Signed)
  Subjective:    Patient ID: Donald Lopez, male    DOB: May 08, 1955, 55 y.o.   MRN: [id number]  HPI Pt returns for f/u of type 1 dm, complicated by retinopathy.  He takes humalog tid (just before each meal), 5-5-7 units, and lantus 17 units qhs.  he is having hypoglycemia in the middle of the night, then it is 200's in am.  He says his exercise is improved recently also.   He takes pravachol as rx'ed.   Past Medical History  Diagnosis Date  . ARTHROSCOPY, LEFT KNEE, HX OF 11/17/2006  . DIABETIC  RETINOPATHY 11/17/2006  . 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 POLYP, GALLBLADDER 11/17/2006  . TOBACCO USE, QUIT 11/17/2006  . VARICOSE VEINS, LOWER EXTREMITIES 11/17/2006  . HYPERLIPIDEMIA 11/17/2006  . DIABETES MELLITUS, TYPE I 11/17/2006  . Type I (juvenile type) diabetes mellitus without mention of complication, uncontrolled    Past Surgical History  Procedure Date  . Vasectomy   . Vitrectomy 2009    Right    reports that he has never smoked. He does not have any smokeless tobacco history on file. He reports that he drinks alcohol. His drug history not on file. family history is not on file. Allergies  Allergen Reactions  . Penicillins     REACTION: unspecified reaction    Review of Systems Denies loc and chest pain.  No weight change     Objective:   Physical Exam GENERAL: no distress Pulses: dorsalis pedis intact bilat.   Feet: no deformity.  no ulcer on the feet.  feet are of normal color and temp.  no edema.  There are bilat varicosities, and slight rust-color. Neuro: sensation is intact to touch on the feet  Assessment & Plan:  Dm, he needs some adjustment in his therapy Dyslipidemia, well-controlled

## 2010-04-20 ENCOUNTER — Other Ambulatory Visit (INDEPENDENT_AMBULATORY_CARE_PROVIDER_SITE_OTHER): Payer: 59

## 2010-04-20 ENCOUNTER — Encounter: Payer: Self-pay | Admitting: Endocrinology

## 2010-04-20 ENCOUNTER — Ambulatory Visit (INDEPENDENT_AMBULATORY_CARE_PROVIDER_SITE_OTHER): Payer: 59 | Admitting: Endocrinology

## 2010-04-20 DIAGNOSIS — Z0000 Encounter for general adult medical examination without abnormal findings: Secondary | ICD-10-CM

## 2010-04-20 DIAGNOSIS — E109 Type 1 diabetes mellitus without complications: Secondary | ICD-10-CM

## 2010-04-20 DIAGNOSIS — Z1211 Encounter for screening for malignant neoplasm of colon: Secondary | ICD-10-CM | POA: Insufficient documentation

## 2010-04-20 DIAGNOSIS — E785 Hyperlipidemia, unspecified: Secondary | ICD-10-CM

## 2010-04-20 DIAGNOSIS — Z0001 Encounter for general adult medical examination with abnormal findings: Secondary | ICD-10-CM | POA: Insufficient documentation

## 2010-04-20 DIAGNOSIS — Z125 Encounter for screening for malignant neoplasm of prostate: Secondary | ICD-10-CM

## 2010-04-20 LAB — URINALYSIS, ROUTINE W REFLEX MICROSCOPIC
Ketones, ur: NEGATIVE
Leukocytes, UA: NEGATIVE
Nitrite: NEGATIVE
Specific Gravity, Urine: 1.01 (ref 1.000–1.030)
Urobilinogen, UA: 0.2 (ref 0.0–1.0)
pH: 7 (ref 5.0–8.0)

## 2010-04-20 LAB — BASIC METABOLIC PANEL
CO2: 28 mEq/L (ref 19–32)
Chloride: 101 mEq/L (ref 96–112)
Creatinine, Ser: 0.6 mg/dL (ref 0.4–1.5)
Potassium: 4.5 mEq/L (ref 3.5–5.1)

## 2010-04-20 LAB — HEPATIC FUNCTION PANEL
ALT: 16 U/L (ref 0–53)
AST: 16 U/L (ref 0–37)
Bilirubin, Direct: 0.2 mg/dL (ref 0.0–0.3)
Total Bilirubin: 0.9 mg/dL (ref 0.3–1.2)
Total Protein: 6 g/dL (ref 6.0–8.3)

## 2010-04-20 LAB — CBC WITH DIFFERENTIAL/PLATELET
Basophils Absolute: 0 10*3/uL (ref 0.0–0.1)
Eosinophils Relative: 2.1 % (ref 0.0–5.0)
Lymphocytes Relative: 25.6 % (ref 12.0–46.0)
Lymphs Abs: 1.6 10*3/uL (ref 0.7–4.0)
Monocytes Relative: 8 % (ref 3.0–12.0)
Neutrophils Relative %: 63.8 % (ref 43.0–77.0)
Platelets: 232 10*3/uL (ref 150.0–400.0)
RDW: 14.1 % (ref 11.5–14.6)
WBC: 6.2 10*3/uL (ref 4.5–10.5)

## 2010-04-20 LAB — LIPID PANEL
HDL: 66.4 mg/dL (ref >39.00)
LDL Cholesterol: 84 mg/dL (ref 0–99)
Total CHOL/HDL Ratio: 2
Triglycerides: 55 mg/dL (ref 0.0–149.0)
VLDL: 11 mg/dL (ref 0.0–40.0)

## 2010-04-20 LAB — PSA: PSA: 2.52 ng/mL (ref 0.10–4.00)

## 2010-04-20 LAB — TSH: TSH: 0.8 u[IU]/mL (ref 0.35–5.50)

## 2010-04-20 LAB — HEMOGLOBIN A1C: Hgb A1c MFr Bld: 8.9 % — ABNORMAL HIGH (ref 4.6–6.5)

## 2010-04-20 NOTE — Patient Instructions (Addendum)
blood tests are being ordered for you today.  please call [phone] to hear your test results. pending the test results, please continue the same medications for now, except reduce lantus to 16 units at bedtime.  Please regular physical appointment in 3 months (we are doing the blood tests for it today, though)

## 2010-06-05 NOTE — Op Note (Signed)
[place]  Patient:    Donald Lopez, Donald Lopez                    MRN: [id number] Proc. Date: 07/23/99 Adm. Date:  [id number] Attending:  Alinda Deem                           Operative Report  PREOPERATIVE DIAGNOSIS:  Left knee lateral meniscal tear.  POSTOPERATIVE DIAGNOSIS:  Left knee lateral meniscal tear.  OPERATION PERFORMED:  Left knee partial arthroscopic lateral meniscectomy.  SURGEON:  Alinda Deem, M.D.  ASSISTANT:  None.  ANESTHESIA:  Local with IV sedation.  ESTIMATED BLOOD LOSS:  Minimal.  TOURNIQUET TIME:  None.  INDICATIONS FOR PROCEDURE:   The patient is a 55 year old male with catching and near locking episodes of his left knee.  Positive McMurrays test on the lateral side, tender along the lateral jointline for many months consistent with lateral meniscal tear.  He desires arthroscopic treatment of same.  He has failed conservative treatment with anti-inflammatory medicines, activity modification and rest.  DESCRIPTION OF PROCEDURE:  The patient was identified by arm band and taken to the operating room at [facility] where the appropriate anesthetic monitors were attached and local anesthesia with intravenous sedation was induced to the left lower extremity.  We gave him a gram of Ancef perioperatively because of his history of insulin-dependent diabetes.  A lateral post was applied to the table.  The left lower extremity was prepped and draped in the usual sterile fashion from the ankle to the midthigh and using a #11 blade a standard inferomedial and inferolateral peripatellar portals were made in a longitudinal fashion about 3/16 inch in length.  The outflow was introduced through the inferomedial portal.  The arthroscope through the inferolateral portal and diagnostic arthroscopy undertaken.  The suprapatellar pouch, patella, trochlea and plica were all in good condition and required no  debridement.  The medial compartment, articular and meniscal cartilage was likewise in good shape as were the ACL and the PCL.  Moving to the lateral compartment, a radial tear in the posterolateral corner of the lateral meniscus just anterior to the popliteal recess was identified.  This was smoothed with basket biters and a 4.2 mm Barracuda sucker shaver creating a crescent shaped defect which was stable.  The patient had also some small grade 2 cracks in the lateral tibial condyle articular cartilage and these were all likewise smooth.  At this point the knee was washed out with normal saline solution.  The arthroscopic instruments were removed.  A dressing of Xeroform, 4 x 8 dressing sponges, Webril and an Ace wrap applied.  The patient was awakened and taken to the recovery room without difficulty. DD:  07/24/99 TD:  07/24/99 Job: [id number]

## 2010-07-20 ENCOUNTER — Encounter: Payer: 59 | Admitting: Endocrinology

## 2010-08-08 ENCOUNTER — Other Ambulatory Visit: Payer: Self-pay | Admitting: Endocrinology

## 2010-08-10 ENCOUNTER — Encounter: Payer: 59 | Admitting: Endocrinology

## 2010-08-14 ENCOUNTER — Ambulatory Visit (INDEPENDENT_AMBULATORY_CARE_PROVIDER_SITE_OTHER): Payer: 59 | Admitting: Endocrinology

## 2010-08-14 ENCOUNTER — Encounter: Payer: Self-pay | Admitting: Endocrinology

## 2010-08-14 VITALS — BP 122/80 | HR 88 | Temp 97.9°F | Ht 67.0 in | Wt 186.0 lb

## 2010-08-14 DIAGNOSIS — E119 Type 2 diabetes mellitus without complications: Secondary | ICD-10-CM

## 2010-08-14 DIAGNOSIS — E109 Type 1 diabetes mellitus without complications: Secondary | ICD-10-CM

## 2010-08-14 DIAGNOSIS — L570 Actinic keratosis: Secondary | ICD-10-CM | POA: Insufficient documentation

## 2010-08-14 NOTE — Patient Instructions (Addendum)
please consider these measures for your health:  minimize alcohol.  do not use tobacco products.  have a colonoscopy at least every 10 years from age 53.  Women should have an annual mammogram from age 19.  keep firearms safely stored.  always use seat belts.  have working smoke alarms in your home.  see an eye doctor and dentist regularly.  never drive under the influence of alcohol or drugs (including prescription drugs).  those with fair skin should take precautions against the sun.   please let me know what your wishes would be, if artificial life support measures should become necessary.  it is critically important to prevent falling down (keep floor areas well-lit, dry, and free of loose objects).   Please continue to pursue the continuous monitor.  Refer to dermatology, and to podiatry.  you will be called with a day and time for an appointment.   good diet and exercise habits significanly improve the control of your diabetes.  please let me know if you wish to be referred to a dietician.  high blood sugar is very risky to your health.  you should see an eye doctor every year. controlling your blood pressure and cholesterol drastically reduces the damage diabetes does to your body.  this also applies to quitting smoking.  please discuss these with your doctor.  you should take an aspirin every day, unless you have been advised by a doctor not to. Please make a follow-up appointment in 3 months.

## 2010-08-14 NOTE — Progress Notes (Signed)
Subjective:    Patient ID: Donald Lopez, male    DOB: 08/23/1955, 55 y.o.   MRN: [id number]  HPI here for regular wellness examination.  He's feeling pretty well in general, and says chronic med probs are stable, except as noted below.  He requests referrals to podiatry and dermatology, for chronic probs.  no cbg record, but states cbg's are variable. Past Medical History  Diagnosis Date  . ARTHROSCOPY, LEFT KNEE, HX OF 11/17/2006  . DIABETIC  RETINOPATHY 11/17/2006  . 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 POLYP, GALLBLADDER 11/17/2006  . TOBACCO USE, QUIT 11/17/2006  . VARICOSE VEINS, LOWER EXTREMITIES 11/17/2006  . HYPERLIPIDEMIA 11/17/2006  . DIABETES MELLITUS, TYPE I 11/17/2006  . Type I (juvenile type) diabetes mellitus without mention of complication, uncontrolled     Past Surgical History  Procedure Date  . Vasectomy   . Vitrectomy 2009    Right    History   Social History  . Marital Status: Married    Spouse Name: N/A    Number of Children: N/A  . Years of Education: N/A   Occupational History  . Not on file.   Social History Main Topics  . Smoking status: Never Smoker   . Smokeless tobacco: Not on file  . Alcohol Use: Yes     5-6/week  . Drug Use: No  . Sexually Active:    Other Topics Concern  . Not on file   Social History Narrative  . No narrative on file    Current Outpatient Prescriptions on File Prior to Visit  Medication Sig Dispense Refill  . aspirin 325 MG tablet Take 325 mg by mouth daily.        Marland Kitchen glucose blood (ACCU-CHEK AVIVA) test strip Use as instructed 5 times a day dx 250.03       . insulin glargine (LANTUS) 100 UNIT/ML injection Inject 16 Units into the skin at bedtime.  10 mL  3  . insulin lispro (HUMALOG) 100 UNIT/ML injection Inject 5 Units into the skin 3 (three) times daily before meals.        . Insulin Syringe-Needle U-100 (B-D INS SYR ULTRAFINE .3CC/30G) 30G X 1/2" 0.3 ML MISC Four times a day dx 250.03       . pravastatin (PRAVACHOL) 80  MG tablet Take 1 tablet at bedtime         Allergies  Allergen Reactions  . Penicillins     REACTION: unspecified reaction    No family history on file.  BP 122/80  Pulse 88  Temp(Src) 97.9 F (36.6 C) (Oral)  Ht 5\' 7"  (1.702 m)  Wt 186 lb (84.369 kg)  BMI 29.13 kg/m2  SpO2 97%     Review of Systems  Constitutional: Negative for fever and unexpected weight change.  HENT: Negative for hearing loss.   Eyes: Negative for visual disturbance.  Respiratory: Negative for shortness of breath.   Cardiovascular: Negative for chest pain.  Gastrointestinal: Negative for anal bleeding.  Genitourinary: Negative for hematuria.  Musculoskeletal: Negative for arthralgias.  Skin: Negative for rash.  Neurological: Negative for syncope.  Hematological: Bruises/bleeds easily.  Psychiatric/Behavioral: Negative for dysphoric mood. The patient is not nervous/anxious.       Objective:   Physical Exam VS: see vs page GEN: no distress HEAD: head: no deformity eyes: no periorbital swelling, no proptosis external nose and ears are normal mouth: no lesion seen NECK: supple, thyroid is not enlarged CHEST WALL: no deformity CV: reg rate and rhythm, no murmur  ABD: abdomen is soft, nontender.  no hepatosplenomegaly.  not distended.  no hernia MUSCULOSKELETAL: muscle bulk and strength are grossly normal.  no obvious joint swelling.  gait is normal and steady EXTEMITIES: no deformity.  no ulcer on the feet.  feet are of normal color and temp.  Trace bilat leg edema.  There are varicosities at the left ankle.  PULSES: dorsalis pedis intact bilat.  no carotid bruit. NEURO:  cn 2-12 grossly intact.   readily moves all 4's.  sensation is intact to touch on the feet SKIN:  Normal texture and temperature.  No rash or suspicious lesion is visible.   NODES:  None palpable at the neck. PSYCH: alert, oriented x3.  Does not appear anxious nor depressed.    Assessment & Plan:  Wellness visit today, with  problems stable, except as noted.

## 2010-08-20 ENCOUNTER — Encounter: Payer: Self-pay | Admitting: Endocrinology

## 2010-09-08 ENCOUNTER — Other Ambulatory Visit: Payer: Self-pay

## 2010-10-05 ENCOUNTER — Other Ambulatory Visit: Payer: Self-pay | Admitting: Endocrinology

## 2010-10-06 ENCOUNTER — Other Ambulatory Visit: Payer: Self-pay

## 2010-10-06 MED ORDER — GLUCOSE BLOOD VI STRP: ORAL_STRIP | Status: DC

## 2010-11-13 ENCOUNTER — Ambulatory Visit: Payer: 59 | Admitting: Endocrinology

## 2010-12-09 ENCOUNTER — Other Ambulatory Visit: Payer: Self-pay | Admitting: Endocrinology

## 2010-12-14 ENCOUNTER — Other Ambulatory Visit: Payer: Self-pay | Admitting: *Deleted

## 2010-12-14 MED ORDER — "INSULIN SYRINGE-NEEDLE U-100 30G X 1/2"" 0.3 ML MISC": Status: DC

## 2010-12-14 NOTE — Telephone Encounter (Signed)
R'cd fax from [facility] for refill of Insulin Syringes  Last OV-07/2010  Last filled-09/22/2010

## 2011-01-21 DIAGNOSIS — H2513 Age-related nuclear cataract, bilateral: Secondary | ICD-10-CM | POA: Insufficient documentation

## 2011-01-21 DIAGNOSIS — H47011 Ischemic optic neuropathy, right eye: Secondary | ICD-10-CM

## 2011-01-21 HISTORY — DX: Ischemic optic neuropathy, right eye: H47.011

## 2011-05-18 ENCOUNTER — Telehealth: Payer: Self-pay | Admitting: *Deleted

## 2011-05-18 ENCOUNTER — Ambulatory Visit (INDEPENDENT_AMBULATORY_CARE_PROVIDER_SITE_OTHER): Payer: 59 | Admitting: Endocrinology

## 2011-05-18 ENCOUNTER — Ambulatory Visit (INDEPENDENT_AMBULATORY_CARE_PROVIDER_SITE_OTHER)
Admission: RE | Admit: 2011-05-18 | Discharge: 2011-05-18 | Disposition: A | Discharge status: Home / Self Care | Payer: 59 | Source: Ambulatory Visit | Attending: Endocrinology | Admitting: Endocrinology

## 2011-05-18 ENCOUNTER — Encounter: Payer: Self-pay | Admitting: Endocrinology

## 2011-05-18 VITALS — BP 128/70 | HR 96 | Temp 98.0°F | Ht 68.0 in | Wt 179.0 lb

## 2011-05-18 DIAGNOSIS — R059 Cough, unspecified: Secondary | ICD-10-CM

## 2011-05-18 DIAGNOSIS — E109 Type 1 diabetes mellitus without complications: Secondary | ICD-10-CM

## 2011-05-18 DIAGNOSIS — I2510 Atherosclerotic heart disease of native coronary artery without angina pectoris: Secondary | ICD-10-CM | POA: Insufficient documentation

## 2011-05-18 DIAGNOSIS — R05 Cough: Secondary | ICD-10-CM

## 2011-05-18 DIAGNOSIS — Z125 Encounter for screening for malignant neoplasm of prostate: Secondary | ICD-10-CM

## 2011-05-18 DIAGNOSIS — R911 Solitary pulmonary nodule: Secondary | ICD-10-CM

## 2011-05-18 DIAGNOSIS — Z79899 Other long term (current) drug therapy: Secondary | ICD-10-CM

## 2011-05-18 MED ORDER — DOXYCYCLINE HYCLATE 100 MG PO TABS: 100.0000 mg | ORAL_TABLET | Freq: Two times a day (BID) | ORAL | Status: AC

## 2011-05-18 MED ORDER — PROMETHAZINE-CODEINE 6.25-10 MG/5ML PO SYRP: 5.0000 mL | ORAL_SOLUTION | ORAL | Status: AC | PRN

## 2011-05-18 NOTE — Patient Instructions (Addendum)
Here are 2 prescriptions:  Cough syrup and antibiotic.  I hope you feel better soon.  If you don't feel better by next week, please call back. Here is a sample of "ventolin."  Take 1-2 puffs every 3 hrs as needed for wheezing. Please schedule a regular physical.  i have requested blood and urine tests for you, to do in advance. A chest-x-ray is requested for you today.  You will receive a letter with results.   (update:  i called pt.  Check ct) (update: i called pt with ct result.  ref cardiol.  Recheck ct in 3-6 mos)

## 2011-05-18 NOTE — Telephone Encounter (Signed)
Non-contrast is ok

## 2011-05-18 NOTE — Telephone Encounter (Signed)
Rose at CT informed.

## 2011-05-18 NOTE — Telephone Encounter (Signed)
CT called regarding CT order for Mr. Viens. Order was placed for CT without contrast. They wanted to make sure you wanted the order without contrast because they usually do CT with contrast.

## 2011-05-18 NOTE — Progress Notes (Signed)
  Subjective:    Patient ID: Donald Lopez, male    DOB: 04/27/1955, 56 y.o.   MRN: [id number]  HPI Pt states few days of moderate prod-quality cough in the chest, and assoc clammy skin. Past Medical History  Diagnosis Date  . ARTHROSCOPY, LEFT KNEE, HX OF 11/17/2006  . DIABETIC  RETINOPATHY 11/17/2006  . 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 POLYP, GALLBLADDER 11/17/2006  . TOBACCO USE, QUIT 11/17/2006  . VARICOSE VEINS, LOWER EXTREMITIES 11/17/2006  . HYPERLIPIDEMIA 11/17/2006  . DIABETES MELLITUS, TYPE I 11/17/2006  . Type I (juvenile type) diabetes mellitus without mention of complication, uncontrolled     Past Surgical History  Procedure Date  . Vasectomy   . Vitrectomy 2009    Right    History   Social History  . Marital Status: Married    Spouse Name: N/A    Number of Children: N/A  . Years of Education: N/A   Occupational History  . Not on file.   Social History Main Topics  . Smoking status: Never Smoker   . Smokeless tobacco: Not on file  . Alcohol Use: Yes     5-6/week  . Drug Use: No  . Sexually Active:    Other Topics Concern  . Not on file   Social History Narrative  . No narrative on file    Current Outpatient Prescriptions on File Prior to Visit  Medication Sig Dispense Refill  . aspirin 325 MG tablet Take 325 mg by mouth daily.        Marland Kitchen glucose blood (ACCU-CHEK AVIVA PLUS) test strip Use as instructed  450 each  3  . HUMALOG 100 UNIT/ML injection INJECT 5 UNITS THREE TIMES DAILY BEFORE MEALS  30 mL  2  . insulin glargine (LANTUS) 100 UNIT/ML injection Inject 16 Units into the skin at bedtime.  10 mL  3  . Insulin Syringe-Needle U-100 (B-D INS SYR ULTRAFINE .3CC/30G) 30G X 1/2" 0.3 ML MISC Four times a day dx 250.03  120 each  5  . pravastatin (PRAVACHOL) 80 MG tablet Take 1 tablet at bedtime         Allergies  Allergen Reactions  . Penicillins     REACTION: unspecified reaction    No family history on file.  BP 128/70  Pulse 96  Temp(Src) 98 F  (36.7 C) (Oral)  Ht 5\' 8"  (1.727 m)  Wt 179 lb (81.194 kg)  BMI 27.22 kg/m2  SpO2 96%  Review of Systems Unaware of any fever.  He has slight wheezing.      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

## 2011-05-19 ENCOUNTER — Telehealth: Payer: Self-pay | Admitting: *Deleted

## 2011-05-19 NOTE — Telephone Encounter (Signed)
Pt calls to speak with Marcelino Duster in scheduling. He would like an appt with Dr. Bishop Limbo and to cancel appt with Dr. Rennis Chris RN

## 2011-05-24 ENCOUNTER — Telehealth: Payer: Self-pay

## 2011-05-24 MED ORDER — FLUTICASONE-SALMETEROL 100-50 MCG/DOSE IN AEPB: 1.0000 | INHALATION_SPRAY | Freq: Two times a day (BID) | RESPIRATORY_TRACT | Status: DC

## 2011-05-24 MED ORDER — LEVOFLOXACIN 500 MG PO TABS: 500.0000 mg | ORAL_TABLET | Freq: Every day | ORAL | Status: AC

## 2011-05-24 NOTE — Telephone Encounter (Signed)
Left message informing pt on VM that prescriptions have been sent to pharmacy and to callback office with any questions/concerns.

## 2011-05-24 NOTE — Telephone Encounter (Signed)
Pt called stating that he is on the last day of his ABX but he is still having productive cough, chills, congestion and wheezing. Pt is requesting a refill of ABX, please advise.

## 2011-05-24 NOTE — Telephone Encounter (Signed)
i have sent 2 prescription2 to your pharmacy: antibiotic and inhaler.  Call for sob or fever

## 2011-06-08 ENCOUNTER — Ambulatory Visit (INDEPENDENT_AMBULATORY_CARE_PROVIDER_SITE_OTHER): Payer: 59 | Admitting: Cardiology

## 2011-06-08 ENCOUNTER — Encounter: Payer: Self-pay | Admitting: Cardiology

## 2011-06-08 VITALS — BP 119/74 | HR 65 | Resp 18 | Ht 68.0 in | Wt 183.1 lb

## 2011-06-08 DIAGNOSIS — I2510 Atherosclerotic heart disease of native coronary artery without angina pectoris: Secondary | ICD-10-CM

## 2011-06-08 DIAGNOSIS — E785 Hyperlipidemia, unspecified: Secondary | ICD-10-CM

## 2011-06-08 DIAGNOSIS — E109 Type 1 diabetes mellitus without complications: Secondary | ICD-10-CM

## 2011-06-08 NOTE — Progress Notes (Signed)
HPI The patient presents for evaluation of coronary calcium. He had an upper respiratory infection and a chest x-ray. This demonstrated a nodule which led to a CT. The nodule was felt to be a calcified granuloma. However, he was found to have calcification in his coronary arteries. He has not had prior cardiac history. He has had maybe a distant past stress test. He denies any symptoms. He exercises routinely walking his dog and going to the [facility]. The patient denies any new symptoms such as chest discomfort, neck or arm discomfort. There has been no new shortness of breath, PND or orthopnea. There have been no reported palpitations, presyncope or syncope.   Allergies  Allergen Reactions  . Penicillins     REACTION: unspecified reaction    Current Outpatient Prescriptions  Medication Sig Dispense Refill  . aspirin 325 MG tablet Take 325 mg by mouth daily.        . Fluticasone-Salmeterol (ADVAIR DISKUS) 100-50 MCG/DOSE AEPB Inhale 1 puff into the lungs 2 (two) times daily.  1 each  1  . glucose blood (ACCU-CHEK AVIVA PLUS) test strip Use as instructed  450 each  3  . HUMALOG 100 UNIT/ML injection INJECT 5 UNITS THREE TIMES DAILY BEFORE MEALS  30 mL  2  . insulin glargine (LANTUS) 100 UNIT/ML injection Inject 16 Units into the skin at bedtime.  10 mL  3  . Insulin Syringe-Needle U-100 (B-D INS SYR ULTRAFINE .3CC/30G) 30G X 1/2" 0.3 ML MISC Four times a day dx 250.03  120 each  5  . pravastatin (PRAVACHOL) 80 MG tablet Take 1 tablet at bedtime       . Dextromethorphan-Guaifenesin (TUSSIN DM PO) Take by mouth. Twice daily      . guaiFENesin (MUCINEX) 600 MG 12 hr tablet Take 1,200 mg by mouth 2 (two) times daily.        Past Medical History  Diagnosis Date  . DIABETIC  RETINOPATHY 11/17/2006  . 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 POLYP, GALLBLADDER 11/17/2006  . TOBACCO USE, QUIT 11/17/2006  . VARICOSE VEINS, LOWER EXTREMITIES 11/17/2006  . HYPERLIPIDEMIA 11/17/2006  . DIABETES MELLITUS, TYPE I  11/17/2006    Past Surgical History  Procedure Date  . Vasectomy   . Vitrectomy 2009    Right  . Knee arthroscopy     Left  . Shoulder surgery     Left    Family History  Problem Relation Age of Onset  . Cancer Father   . Hypertension Mother     History   Social History  . Marital Status: Married    Spouse Name: Leonides Sake Prendergrast    Number of Children: 3  . Years of Education: N/A   Occupational History  . Research officer, political party    Social History Main Topics  . Smoking status: Passive Smoker    Types: Cigars  . Smokeless tobacco: Not on file   Comment: only occas. smoker.  . Alcohol Use: Yes     5-6/week  . Drug Use: No  . Sexually Active: Not on file   Other Topics Concern  . Not on file   Social History Narrative  . No narrative on file    ROS:  As stated in the HPI and negative for all other systems.  PHYSICAL EXAM BP 119/74  Pulse 65  Resp 18  Ht 5\' 8"  (1.727 m)  Wt 183 lb 1.9 oz (83.063 kg)  BMI 27.84 kg/m2 GENERAL:  Well appearing HEENT:  Pupils equal round and reactive,  fundi not visualized, oral mucosa unremarkable NECK:  No jugular venous distention, waveform within normal limits, carotid upstroke brisk and symmetric, no bruits, no thyromegaly LYMPHATICS:  No cervical, inguinal adenopathy LUNGS:  Clear to auscultation bilaterally BACK:  No CVA tenderness CHEST:  Unremarkable HEART:  PMI not displaced or sustained,S1 and S2 within normal limits, no S3, no S4, no clicks, no rubs, no murmurs ABD:  Flat, positive bowel sounds normal in frequency in pitch, no bruits, no rebound, no guarding, no midline pulsatile mass, no hepatomegaly, no splenomegaly EXT:  2 plus pulses throughout, no edema, no cyanosis no clubbing SKIN:  No rashes no nodules NEURO:  Cranial nerves II through XII grossly intact, motor grossly intact throughout PSYCH:  Cognitively intact, oriented to person place and time  EKG:  Sinus rhythm, rate 65, axis within normal limits,  intervals within normal limits, no acute ST-T wave changes.  ASSESSMENT AND PLAN

## 2011-06-08 NOTE — Patient Instructions (Signed)
The current medical regimen is effective;  continue present plan and medications.  Your physician has requested that you have an exercise tolerance test. For further information please visit [email or web address]. Please also follow instruction sheet, as given.   

## 2011-06-08 NOTE — Assessment & Plan Note (Signed)
We discussed the importance of tight diabetes control.

## 2011-06-08 NOTE — Assessment & Plan Note (Signed)
His last LDL was 84 with HDL of 66. This is a reasonable ratio. He's not been taking pravastatin daily. I told him I would like him to do this for about 3 months and then I will likely get a Lipomed

## 2011-06-08 NOTE — Assessment & Plan Note (Signed)
We discussed this at length.  I will bring the patient back for a POET (Plain Old Exercise Test). This will allow me to screen for obstructive coronary disease, risk stratify and very importantly provide a prescription for exercise.  He needs aggressive risk reduction.

## 2011-07-05 ENCOUNTER — Other Ambulatory Visit: Payer: Self-pay | Admitting: Endocrinology

## 2011-07-05 MED ORDER — GLUCOSE BLOOD VI STRP: ORAL_STRIP | Status: DC

## 2011-07-05 MED ORDER — PRAVASTATIN SODIUM 80 MG PO TABS: ORAL_TABLET | ORAL | Status: DC

## 2011-07-05 NOTE — Telephone Encounter (Signed)
Pt needs refill of Lantus and Pravastatin sent to [facility] and refill of Accu Chek Aviva test strips sent to [facility]. Rx's sent, pt informed.

## 2011-07-15 ENCOUNTER — Encounter: Payer: Self-pay | Admitting: Nurse Practitioner

## 2011-07-15 ENCOUNTER — Ambulatory Visit (INDEPENDENT_AMBULATORY_CARE_PROVIDER_SITE_OTHER): Payer: 59 | Admitting: Nurse Practitioner

## 2011-07-15 DIAGNOSIS — I2510 Atherosclerotic heart disease of native coronary artery without angina pectoris: Secondary | ICD-10-CM

## 2011-07-15 NOTE — Procedures (Signed)
Exercise Treadmill Test  Pre-Exercise Testing Evaluation Rhythm: normal sinus  Rate: 75   PR:  .16 QRS:  .08  QT:  .38 QTc: .42     Test  Exercise Tolerance Test Ordering MD: Angelina Sheriff, MD  Interpreting MD: Ward Givens , NP  Unique Test No: 1  Treadmill:  1  Indication for ETT: known ASHD  Contraindication to ETT: No   Stress Modality: exercise - treadmill  Cardiac Imaging Performed: non   Protocol: standard Bruce - maximal  Max BP:    Max MPHR (bpm):  167 85% MPR (bpm):  141  MPHR obtained (bpm):  % MPHR obtained:  92%  Reached 85% MPHR (min:sec):  6:44 Total Exercise Time (min-sec):  9:00  Workload in METS:  10.1 Borg Scale: 15  Reason ETT Terminated:  leg cramps    ST Segment Analysis At Rest: normal ST segments - no evidence of significant ST depression With Exercise: non-specific ST changes  Other Information Arrhythmia:  1 ventricular couplet Angina during ETT:  absent (0) Quality of ETT:  diagnostic  ETT Interpretation:  normal - no evidence of ischemia by ST analysis  Comments: Good exercise tolerance.  Max HR 153 - dropped to 139 after 1 min of recovery.  No acute st/t changes.  Recommendations: Follow-up PRN.

## 2011-08-16 ENCOUNTER — Encounter: Payer: 59 | Admitting: Endocrinology

## 2011-08-26 DIAGNOSIS — E11359 Type 2 diabetes mellitus with proliferative diabetic retinopathy without macular edema: Secondary | ICD-10-CM

## 2011-08-26 DIAGNOSIS — E113599 Type 2 diabetes mellitus with proliferative diabetic retinopathy without macular edema, unspecified eye: Secondary | ICD-10-CM | POA: Insufficient documentation

## 2011-08-26 HISTORY — DX: Type 2 diabetes mellitus with proliferative diabetic retinopathy without macular edema: E11.359

## 2011-09-06 ENCOUNTER — Other Ambulatory Visit (INDEPENDENT_AMBULATORY_CARE_PROVIDER_SITE_OTHER): Payer: 59

## 2011-09-06 DIAGNOSIS — Z125 Encounter for screening for malignant neoplasm of prostate: Secondary | ICD-10-CM

## 2011-09-06 DIAGNOSIS — Z79899 Other long term (current) drug therapy: Secondary | ICD-10-CM

## 2011-09-06 DIAGNOSIS — E109 Type 1 diabetes mellitus without complications: Secondary | ICD-10-CM

## 2011-09-06 LAB — URINALYSIS, ROUTINE W REFLEX MICROSCOPIC
Bilirubin Urine: NEGATIVE
Hgb urine dipstick: NEGATIVE
Nitrite: NEGATIVE
Urobilinogen, UA: 0.2 (ref 0.0–1.0)

## 2011-09-06 LAB — CBC WITH DIFFERENTIAL/PLATELET
Eosinophils Absolute: 0.4 10*3/uL (ref 0.0–0.7)
MCHC: 33.6 g/dL (ref 30.0–36.0)
MCV: 93.4 fl (ref 78.0–100.0)
Monocytes Absolute: 0.5 10*3/uL (ref 0.1–1.0)
Neutrophils Relative %: 49.8 % (ref 43.0–77.0)
Platelets: 255 10*3/uL (ref 150.0–400.0)
RDW: 14.1 % (ref 11.5–14.6)

## 2011-09-06 LAB — BASIC METABOLIC PANEL
Calcium: 9.1 mg/dL (ref 8.4–10.5)
Creatinine, Ser: 0.7 mg/dL (ref 0.4–1.5)
GFR: 124.97 mL/min (ref >60.00)
Sodium: 142 mEq/L (ref 135–145)

## 2011-09-06 LAB — TSH: TSH: 1.27 u[IU]/mL (ref 0.35–5.50)

## 2011-09-06 LAB — HEPATIC FUNCTION PANEL
ALT: 17 U/L (ref 0–53)
AST: 17 U/L (ref 0–37)
Alkaline Phosphatase: 76 U/L (ref 39–117)
Bilirubin, Direct: 0.1 mg/dL (ref 0.0–0.3)
Total Bilirubin: 0.7 mg/dL (ref 0.3–1.2)

## 2011-09-06 LAB — MICROALBUMIN / CREATININE URINE RATIO
Creatinine,U: 158 mg/dL
Microalb Creat Ratio: 0.4 mg/g (ref 0.0–30.0)
Microalb, Ur: 0.6 mg/dL (ref 0.0–1.9)

## 2011-09-06 LAB — LIPID PANEL
LDL Cholesterol: 108 mg/dL — ABNORMAL HIGH (ref 0–99)
Total CHOL/HDL Ratio: 3
Triglycerides: 69 mg/dL (ref 0.0–149.0)

## 2011-09-06 LAB — HEMOGLOBIN A1C: Hgb A1c MFr Bld: 9 % — ABNORMAL HIGH (ref 4.6–6.5)

## 2011-09-06 LAB — PSA: PSA: 3.3 ng/mL (ref 0.10–4.00)

## 2011-09-10 ENCOUNTER — Encounter: Payer: Self-pay | Admitting: Endocrinology

## 2011-09-10 ENCOUNTER — Ambulatory Visit (INDEPENDENT_AMBULATORY_CARE_PROVIDER_SITE_OTHER): Payer: 59 | Admitting: Endocrinology

## 2011-09-10 VITALS — BP 124/70 | HR 76 | Temp 97.2°F | Ht 68.0 in | Wt 190.0 lb

## 2011-09-10 DIAGNOSIS — E109 Type 1 diabetes mellitus without complications: Secondary | ICD-10-CM

## 2011-09-10 DIAGNOSIS — Z23 Encounter for immunization: Secondary | ICD-10-CM

## 2011-09-10 NOTE — Progress Notes (Signed)
Subjective:    Patient ID: Donald Lopez, male    DOB: 02-Aug-1955, 56 y.o.   MRN: [id number]  HPI here for regular wellness examination.  He's feeling pretty well in general, and says chronic med probs are stable, except as noted below. Past Medical History  Diagnosis Date  . DIABETIC  RETINOPATHY 11/17/2006  . 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 POLYP, GALLBLADDER 11/17/2006  . TOBACCO USE, QUIT 11/17/2006  . VARICOSE VEINS, LOWER EXTREMITIES 11/17/2006  . HYPERLIPIDEMIA 11/17/2006  . DIABETES MELLITUS, TYPE I 11/17/2006    Past Surgical History  Procedure Date  . Vasectomy   . Vitrectomy 2009    Right  . Knee arthroscopy     Left  . Shoulder surgery     Left    History   Social History  . Marital Status: Married    Spouse Name: Leonides Sake Prendergrast    Number of Children: 3  . Years of Education: N/A   Occupational History  . Research officer, political party    Social History Main Topics  . Smoking status: Passive Smoker    Types: Cigars  . Smokeless tobacco: Not on file   Comment: only occas. smoker.  . Alcohol Use: Yes     5-6/week  . Drug Use: No  . Sexually Active: Not on file   Other Topics Concern  . Not on file   Social History Narrative  . No narrative on file    Current Outpatient Prescriptions on File Prior to Visit  Medication Sig Dispense Refill  . aspirin 325 MG tablet Take 325 mg by mouth daily.        Marland Kitchen Dextromethorphan-Guaifenesin (TUSSIN DM PO) Take by mouth. Twice daily      . Fluticasone-Salmeterol (ADVAIR DISKUS) 100-50 MCG/DOSE AEPB Inhale 1 puff into the lungs 2 (two) times daily.  1 each  1  . glucose blood (ACCU-CHEK AVIVA PLUS) test strip Use as instructed five times daily  450 each  1  . guaiFENesin (MUCINEX) 600 MG 12 hr tablet Take 1,200 mg by mouth 2 (two) times daily.      Marland Kitchen HUMALOG 100 UNIT/ML injection INJECT 5 UNITS THREE TIMES DAILY BEFORE MEALS  30 mL  2  . insulin glargine (LANTUS) 100 UNIT/ML injection Inject 16 Units into the skin at bedtime.   30 mL  1  . Insulin Syringe-Needle U-100 (B-D INS SYR ULTRAFINE .3CC/30G) 30G X 1/2" 0.3 ML MISC Four times a day dx 250.03  120 each  5  . pravastatin (PRAVACHOL) 80 MG tablet Take 1 tablet at bedtime  90 tablet  1    Allergies  Allergen Reactions  . Penicillins     REACTION: unspecified reaction    Family History  Problem Relation Age of Onset  . Cancer Father   . Hypertension Mother     BP 124/70  Pulse 76  Temp 97.2 F (36.2 C) (Oral)  Ht 5\' 8"  (1.727 m)  Wt 190 lb (86.183 kg)  BMI 28.89 kg/m2  SpO2 97%    Review of Systems  Constitutional: Negative for fever and unexpected weight change.  HENT: Negative for hearing loss.   Eyes: Negative for visual disturbance.  Respiratory: Negative for shortness of breath.   Cardiovascular: Negative for chest pain.  Gastrointestinal: Negative for anal bleeding.  Genitourinary: Negative for hematuria and difficulty urinating.  Musculoskeletal: Negative for back pain.  Skin: Negative for rash.  Neurological: Negative for syncope, numbness and headaches.  Hematological: Bruises/bleeds easily.  Psychiatric/Behavioral: Negative for dysphoric  mood.       Objective:   Physical Exam VS: see vs page GEN: no distress HEAD: head: no deformity eyes: no periorbital swelling, no proptosis external nose and ears are normal mouth: no lesion seen NECK: supple, thyroid is not enlarged CHEST WALL: no deformity LUNGS: clear to auscultation. CV: reg rate and rhythm, no murmur ABD: abdomen is soft, nontender.  no hepatosplenomegaly.  not distended.  no hernia. RECTAL: normal external and internal exam.  heme neg. PROSTATE:  Normal size.  No nodule.   MUSCULOSKELETAL: muscle bulk and strength are grossly normal.  no obvious joint swelling.  gait is normal and steady PULSES: dorsalis pedis intact bilat.  no carotid bruit NEURO:  cn 2-12 grossly intact.   readily moves all 4's.  sensation is intact to touch on the feet SKIN:  Normal  texture and temperature.  No rash or suspicious lesion is visible.   NODES:  None palpable at the neck. PSYCH: alert, oriented x3.  Does not appear anxious nor depressed.         Assessment & Plan:  Wellness visit today, with problems stable, except as noted.     SEPARATE EVALUATION FOLLOWS--EACH PROBLEM HERE IS NEW, NOT RESPONDING TO TREATMENT, OR POSES SIGNIFICANT RISK TO THE PATIENT'S HEALTH: HISTORY OF THE PRESENT ILLNESS: Pt returns for f/u of type 1 DM (dx'ed 1985; complicated by CAD and retinopathy; he has declined pump and continuous glucose monitor).  He says rx is limited by frequent business travel and unpredictability of cbg's. Pt says he sometimes misses his pravachol PAST MEDICAL HISTORY reviewed and up to date today REVIEW OF SYSTEMS: He has occasional hypoglycemia, usually before lunch.   PHYSICAL EXAMINATION: VITAL SIGNS:  See vs page GENERAL: no distress EXTEMITIES: no deformity.  no ulcer on the feet.  feet are of normal color and temp.  Trace left leg edema.  There are bilat varicosities (L>R). LAB/XRAY RESULTS: Lab Results  Component Value Date   CHOL 194 09/06/2011   HDL 72.10 09/06/2011   LDLCALC 108* 09/06/2011   LDLDIRECT 126.3 02/21/2009   TRIG 69.0 09/06/2011   CHOLHDL 3 09/06/2011   Lab Results  Component Value Date   HGBA1C 9.0* 09/06/2011  IMPRESSION: Type 1 DM.  needs increased rx PLAN: See instruction page

## 2011-09-10 NOTE — Patient Instructions (Addendum)
You should consider a continuous glucose monitor.   please consider these measures for your health:  minimize alcohol.  do not use tobacco products.  have a colonoscopy at least every 10 years from age 54.  keep firearms safely stored.  always use seat belts.  have working smoke alarms in your home.  see an eye doctor and dentist regularly.  never drive under the influence of alcohol or drugs (including prescription drugs).  those with fair skin should take precautions against the sun. Please come back for a follow-up appointment in 3 months.

## 2011-10-12 ENCOUNTER — Other Ambulatory Visit: Payer: Self-pay | Admitting: *Deleted

## 2011-10-12 MED ORDER — ONETOUCH ULTRA MINI W/DEVICE KIT: 1.0000 | PACK | Freq: Once | Status: DC

## 2011-10-12 MED ORDER — GLUCOSE BLOOD VI STRP: ORAL_STRIP | Status: DC

## 2011-10-12 MED ORDER — "INSULIN SYRINGE-NEEDLE U-100 30G X 1/2"" 0.3 ML MISC": Status: DC

## 2011-10-12 NOTE — Telephone Encounter (Signed)
R'cd fax from [facility] for refill of Onetouch Meter and test strips and insulin syringes.

## 2011-12-06 ENCOUNTER — Ambulatory Visit: Payer: 59 | Admitting: Endocrinology

## 2012-01-06 ENCOUNTER — Other Ambulatory Visit: Payer: Self-pay

## 2012-01-06 MED ORDER — PRAVASTATIN SODIUM 80 MG PO TABS: ORAL_TABLET | ORAL | Status: DC

## 2012-01-24 ENCOUNTER — Ambulatory Visit: Payer: 59 | Admitting: Endocrinology

## 2012-03-10 ENCOUNTER — Ambulatory Visit: Payer: 59 | Admitting: Endocrinology

## 2012-03-31 ENCOUNTER — Ambulatory Visit: Payer: 59 | Admitting: Endocrinology

## 2012-04-19 ENCOUNTER — Telehealth: Payer: Self-pay | Admitting: Endocrinology

## 2012-04-19 NOTE — Telephone Encounter (Signed)
No-show fee removed, patient informed
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
## 2012-04-21 ENCOUNTER — Ambulatory Visit: Payer: 59 | Admitting: Endocrinology

## 2012-06-07 ENCOUNTER — Telehealth: Payer: Self-pay | Admitting: Endocrinology

## 2012-06-07 NOTE — Telephone Encounter (Signed)
Call regarding: Appt change; cancelled appt for 06/09/12; says he had a scheduling conflict; advised to call Dr.Ellison's new office at [phone] to reschedule

## 2012-06-09 ENCOUNTER — Ambulatory Visit: Payer: 59 | Admitting: Endocrinology

## 2012-06-23 ENCOUNTER — Other Ambulatory Visit (INDEPENDENT_AMBULATORY_CARE_PROVIDER_SITE_OTHER): Payer: 59

## 2012-06-23 ENCOUNTER — Encounter: Payer: Self-pay | Admitting: Endocrinology

## 2012-06-23 ENCOUNTER — Ambulatory Visit (INDEPENDENT_AMBULATORY_CARE_PROVIDER_SITE_OTHER): Payer: 59 | Admitting: Endocrinology

## 2012-06-23 VITALS — BP 130/76 | HR 78 | Ht 67.0 in | Wt 190.0 lb

## 2012-06-23 DIAGNOSIS — E109 Type 1 diabetes mellitus without complications: Secondary | ICD-10-CM

## 2012-06-23 NOTE — Patient Instructions (Addendum)
Please come back for a regular physical appointment in 3 months.   check your blood sugar 4 times a day--before the 3 meals, and at bedtime.  also check if you have symptoms of your blood sugar being too high or too low.  please keep a record of the readings and bring it to your next appointment here.  please call us sooner if your blood sugar goes below 70, or if you have a lot of readings over 200.

## 2012-06-23 NOTE — Progress Notes (Signed)
Subjective:    Patient ID: Donald Lopez, male    DOB: 22-Dec-1955, 57 y.o.   MRN: [id number]  HPI Pt returns for f/u of type 1 DM (dx'ed 1985; he has mild if any neuropathy of the lower extremities; he has associated CAD and proliferative retinopathy; he has declined pump and continuous glucose monitor; he has never had severe hypoglycemia or DKA).  He says rx is limited by frequent business travel and unpredictability of cbg's.  no cbg record, but states cbg's are mildly low a few times a week.  This happens with activity, or with a delayed meal.  It can also happen with overcompensation for elev cbg's.   Past Medical History  Diagnosis Date  . DIABETIC  RETINOPATHY 11/17/2006  . 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 POLYP, GALLBLADDER 11/17/2006  . TOBACCO USE, QUIT 11/17/2006  . VARICOSE VEINS, LOWER EXTREMITIES 11/17/2006  . HYPERLIPIDEMIA 11/17/2006  . DIABETES MELLITUS, TYPE I 11/17/2006    Past Surgical History  Procedure Laterality Date  . Vasectomy    . Vitrectomy  2009    Right  . Knee arthroscopy      Left  . Shoulder surgery      Left    History   Social History  . Marital Status: Married    Spouse Name: Donald Lopez    Number of Children: 3  . Years of Education: N/A   Occupational History  . Research officer, political party    Social History Main Topics  . Smoking status: Passive Smoke Exposure - Never Smoker    Types: Cigars  . Smokeless tobacco: Not on file     Comment: only occas. smoker.  . Alcohol Use: Yes     Comment: 5-6/week  . Drug Use: No  . Sexually Active: Not on file   Other Topics Concern  . Not on file   Social History Narrative  . No narrative on file    Current Outpatient Prescriptions on File Prior to Visit  Medication Sig Dispense Refill  . aspirin 325 MG tablet Take 325 mg by mouth daily.        . Blood Glucose Monitoring Suppl (ONE TOUCH ULTRA MINI) W/DEVICE KIT 1 Device by Does not apply route once.  1 each  0  . Dextromethorphan-Guaifenesin  (TUSSIN DM PO) Take by mouth. Twice daily      . glucose blood (ONE TOUCH ULTRA TEST) test strip Use as instructed to check blood sugar five times daily dx 250.03  500 each  3  . guaiFENesin (MUCINEX) 600 MG 12 hr tablet Take 1,200 mg by mouth 2 (two) times daily.      Marland Kitchen HUMALOG 100 UNIT/ML injection INJECT 5 UNITS THREE TIMES DAILY BEFORE MEALS  30 mL  2  . insulin glargine (LANTUS) 100 UNIT/ML injection Inject 16 Units into the skin at bedtime.  30 mL  1  . Insulin Syringe-Needle U-100 (B-D INS SYR ULTRAFINE .3CC/30G) 30G X 1/2" 0.3 ML MISC Use as directed Four times a day dx 250.03  400 each  3  . pravastatin (PRAVACHOL) 80 MG tablet Take 1 tablet at bedtime  90 tablet  1   No current facility-administered medications on file prior to visit.    Allergies  Allergen Reactions  . Penicillins     REACTION: unspecified reaction   Family History  Problem Relation Age of Onset  . Cancer Father   . Hypertension Mother    BP 130/76  Pulse 78  Ht 5\' 7"  (1.702 m)  Wt 190 lb (86.183 kg)  BMI 29.75 kg/m2  SpO2 97%  Review of Systems Denies LOC and weight change    Objective:   Physical Exam VITAL SIGNS:  See vs page GENERAL: no distress Lab Results  Component Value Date   HGBA1C 9.3* 06/23/2012      Assessment & Plan:  DM: This insulin regimen was chosen from multiple options, as it best matches his insulin to his changing requirements throughout the day.  The benefits of glycemic control must be weighed against the risks of hypoglycemia.  therapy limited by noncompliance with cbg recording and f/u appts.  i'll do the best i can.

## 2012-08-04 ENCOUNTER — Other Ambulatory Visit: Payer: Self-pay | Admitting: Endocrinology

## 2012-08-04 MED ORDER — ONETOUCH ULTRA MINI W/DEVICE KIT: 1.0000 | PACK | Freq: Once | Status: DC

## 2012-08-21 ENCOUNTER — Other Ambulatory Visit: Payer: Self-pay | Admitting: *Deleted

## 2012-08-21 MED ORDER — INSULIN GLARGINE 100 UNIT/ML ~~LOC~~ SOLN: 16.0000 [IU] | Freq: Every day | SUBCUTANEOUS | Status: DC

## 2012-08-28 ENCOUNTER — Other Ambulatory Visit: Payer: Self-pay | Admitting: *Deleted

## 2012-08-28 MED ORDER — "INSULIN SYRINGE-NEEDLE U-100 30G X 1/2"" 0.3 ML MISC": Status: DC

## 2012-10-06 ENCOUNTER — Ambulatory Visit: Payer: 59 | Admitting: Endocrinology

## 2012-10-09 ENCOUNTER — Encounter: Payer: Self-pay | Admitting: Endocrinology

## 2012-10-09 ENCOUNTER — Ambulatory Visit (INDEPENDENT_AMBULATORY_CARE_PROVIDER_SITE_OTHER): Payer: 59 | Admitting: Endocrinology

## 2012-10-09 VITALS — BP 134/76 | HR 82 | Ht 68.0 in | Wt 194.0 lb

## 2012-10-09 DIAGNOSIS — R972 Elevated prostate specific antigen [PSA]: Secondary | ICD-10-CM

## 2012-10-09 DIAGNOSIS — Z0000 Encounter for general adult medical examination without abnormal findings: Secondary | ICD-10-CM

## 2012-10-09 DIAGNOSIS — Z79899 Other long term (current) drug therapy: Secondary | ICD-10-CM

## 2012-10-09 DIAGNOSIS — E109 Type 1 diabetes mellitus without complications: Secondary | ICD-10-CM

## 2012-10-09 DIAGNOSIS — Z125 Encounter for screening for malignant neoplasm of prostate: Secondary | ICD-10-CM

## 2012-10-09 DIAGNOSIS — E785 Hyperlipidemia, unspecified: Secondary | ICD-10-CM

## 2012-10-09 DIAGNOSIS — Z23 Encounter for immunization: Secondary | ICD-10-CM

## 2012-10-09 LAB — BASIC METABOLIC PANEL
BUN: 16 mg/dL (ref 6–23)
CO2: 31 mEq/L (ref 19–32)
Creatinine, Ser: 0.9 mg/dL (ref 0.4–1.5)
GFR: 99.48 mL/min (ref >60.00)
Glucose, Bld: 118 mg/dL — ABNORMAL HIGH (ref 70–99)
Potassium: 4.9 mEq/L (ref 3.5–5.1)
Sodium: 138 mEq/L (ref 135–145)

## 2012-10-09 LAB — URINALYSIS, ROUTINE W REFLEX MICROSCOPIC
Bilirubin Urine: NEGATIVE
Hgb urine dipstick: NEGATIVE
Ketones, ur: NEGATIVE
Leukocytes, UA: NEGATIVE
Total Protein, Urine: NEGATIVE
Urobilinogen, UA: 0.2 (ref 0.0–1.0)

## 2012-10-09 LAB — LIPID PANEL
HDL: 79.4 mg/dL (ref >39.00)
LDL Cholesterol: 109 mg/dL — ABNORMAL HIGH (ref 0–99)
Total CHOL/HDL Ratio: 3
VLDL: 10.4 mg/dL (ref 0.0–40.0)

## 2012-10-09 LAB — MICROALBUMIN / CREATININE URINE RATIO
Creatinine,U: 151.6 mg/dL
Microalb, Ur: 0.3 mg/dL (ref 0.0–1.9)

## 2012-10-09 LAB — HEPATIC FUNCTION PANEL
AST: 16 U/L (ref 0–37)
Alkaline Phosphatase: 69 U/L (ref 39–117)
Bilirubin, Direct: 0.1 mg/dL (ref 0.0–0.3)
Total Protein: 6.6 g/dL (ref 6.0–8.3)

## 2012-10-09 LAB — CBC WITH DIFFERENTIAL/PLATELET
Basophils Absolute: 0.1 10*3/uL (ref 0.0–0.1)
Eosinophils Absolute: 0.4 10*3/uL (ref 0.0–0.7)
HCT: 45.4 % (ref 39.0–52.0)
Hemoglobin: 15.6 g/dL (ref 13.0–17.0)
Lymphocytes Relative: 32.2 % (ref 12.0–46.0)
Lymphs Abs: 2.4 10*3/uL (ref 0.7–4.0)
MCHC: 34.3 g/dL (ref 30.0–36.0)
MCV: 92.6 fl (ref 78.0–100.0)
Monocytes Absolute: 0.6 10*3/uL (ref 0.1–1.0)
Neutro Abs: 4 10*3/uL (ref 1.4–7.7)
Neutrophils Relative %: 54 % (ref 43.0–77.0)
Platelets: 268 10*3/uL (ref 150.0–400.0)
RBC: 4.91 Mil/uL (ref 4.22–5.81)
RDW: 14.1 % (ref 11.5–14.6)

## 2012-10-09 LAB — PSA: PSA: 4.24 ng/mL — ABNORMAL HIGH (ref 0.10–4.00)

## 2012-10-09 NOTE — Progress Notes (Signed)
Subjective:    Patient ID: Donald Lopez, male    DOB: 02/21/1955, 57 y.o.   MRN: [id number]  HPI Pt returns for f/u of type 1 DM (dx'ed 1985; he has mild if any neuropathy of the lower extremities; he has associated CAD and proliferative retinopathy; he has declined pump and continuous glucose monitor; he has never had severe hypoglycemia or DKA).  He says cbg's are high in the middle of the night, even if he does not eat at hs.  He has mild hypoglycemia after supper approx 1-2 times per week. He takes humalog 3 times a day (just before each meal) 5-7-7 units Past Medical History  Diagnosis Date  . DIABETIC  RETINOPATHY 11/17/2006  . 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 POLYP, GALLBLADDER 11/17/2006  . TOBACCO USE, QUIT 11/17/2006  . VARICOSE VEINS, LOWER EXTREMITIES 11/17/2006  . HYPERLIPIDEMIA 11/17/2006  . DIABETES MELLITUS, TYPE I 11/17/2006    Past Surgical History  Procedure Laterality Date  . Vasectomy    . Vitrectomy  2009    Right  . Knee arthroscopy      Left  . Shoulder surgery      Left    History   Social History  . Marital Status: Married    Spouse Name: Leonides Sake Prendergrast    Number of Children: 3  . Years of Education: N/A   Occupational History  . Research officer, political party    Social History Main Topics  . Smoking status: Passive Smoke Exposure - Never Smoker    Types: Cigars  . Smokeless tobacco: Not on file     Comment: only occas. smoker.  . Alcohol Use: Yes     Comment: 5-6/week  . Drug Use: No  . Sexual Activity: Not on file   Other Topics Concern  . Not on file   Social History Narrative  . No narrative on file    Current Outpatient Prescriptions on File Prior to Visit  Medication Sig Dispense Refill  . aspirin 325 MG tablet Take 325 mg by mouth daily.        . Blood Glucose Monitoring Suppl (ONE TOUCH ULTRA MINI) W/DEVICE KIT 1 Device by Does not apply route once.  1 each  0  . glucose blood (ONE TOUCH ULTRA TEST) test strip Use as instructed to check blood  sugar five times daily dx 250.03  500 each  3  . guaiFENesin (MUCINEX) 600 MG 12 hr tablet Take 1,200 mg by mouth 2 (two) times daily.      . insulin glargine (LANTUS) 100 UNIT/ML injection Inject 0.16 mLs (16 Units total) into the skin at bedtime.  10 mL  3  . Insulin Syringe-Needle U-100 (B-D INS SYR ULTRAFINE .3CC/30G) 30G X 1/2" 0.3 ML MISC Use as directed Four times a day Dx code: 250.03  200 each  3  . pravastatin (PRAVACHOL) 80 MG tablet Take 1 tablet at bedtime  90 tablet  1   No current facility-administered medications on file prior to visit.    Allergies  Allergen Reactions  . Penicillins     REACTION: unspecified reaction    Family History  Problem Relation Age of Onset  . Cancer Father   . Hypertension Mother     BP 134/76  Pulse 82  Ht 5\' 8"  (1.727 m)  Wt 194 lb (87.998 kg)  BMI 29.5 kg/m2  SpO2 97%  Review of Systems No weight change or LOC    Objective:   Physical Exam VITAL SIGNS:  See  vs page GENERAL: no distress    Lab Results  Component Value Date   WBC 7.4 10/09/2012   HGB 15.6 10/09/2012   HCT 45.4 10/09/2012   PLT 268.0 10/09/2012   GLUCOSE 118* 10/09/2012   CHOL 199 10/09/2012   TRIG 52.0 10/09/2012   HDL 79.40 10/09/2012   LDLDIRECT 126.3 02/21/2009   LDLCALC 109* 10/09/2012   ALT 17 10/09/2012   AST 16 10/09/2012   NA 138 10/09/2012   K 4.9 10/09/2012   CL 103 10/09/2012   CREATININE 0.9 10/09/2012   BUN 16 10/09/2012   CO2 31 10/09/2012   TSH 1.11 10/09/2012   PSA 4.24* 10/09/2012   HGBA1C 8.9* 10/09/2012   MICROALBUR 0.3 10/09/2012      Assessment & Plan:  Elevated PSA: new DM: he needs increased rx Dyslipidemia: he needs increased rx

## 2012-10-09 NOTE — Patient Instructions (Addendum)
Please come back for a regular physical appointment in january.   check your blood sugar 4 times a day--before the 3 meals, and at bedtime.  also check if you have symptoms of your blood sugar being too high or too low.  please keep a record of the readings and bring it to your next appointment here.  please call us sooner if your blood sugar goes below 70, or if you have a lot of readings over 200.   blood tests are being requested for you today.  We'll contact you with results. pending the test results, please reduce humalog to humalog 3 times a day (just before each meal) 5-7-6 units.

## 2012-10-12 ENCOUNTER — Encounter: Payer: Self-pay | Admitting: Endocrinology

## 2012-11-13 ENCOUNTER — Other Ambulatory Visit: Payer: Self-pay | Admitting: *Deleted

## 2012-11-13 MED ORDER — "INSULIN SYRINGE-NEEDLE U-100 30G X 1/2"" 0.3 ML MISC": Status: DC

## 2012-11-13 MED ORDER — GLUCOSE BLOOD VI STRP: ORAL_STRIP | Status: DC

## 2012-11-14 ENCOUNTER — Telehealth: Payer: Self-pay | Admitting: Endocrinology

## 2012-11-14 DIAGNOSIS — E109 Type 1 diabetes mellitus without complications: Secondary | ICD-10-CM

## 2012-11-14 MED ORDER — "INSULIN SYRINGE-NEEDLE U-100 30G X 1/2"" 0.3 ML MISC": Status: DC

## 2012-11-14 MED ORDER — GLUCOSE BLOOD VI STRP: ORAL_STRIP | Status: DC

## 2012-11-14 NOTE — Telephone Encounter (Signed)
Rx sent to pharmacy   

## 2012-12-25 ENCOUNTER — Other Ambulatory Visit: Payer: Self-pay

## 2012-12-25 MED ORDER — INSULIN GLARGINE 100 UNIT/ML ~~LOC~~ SOLN: 16.0000 [IU] | Freq: Every day | SUBCUTANEOUS | Status: DC

## 2012-12-25 NOTE — Telephone Encounter (Signed)
Refill for Lantus sent to [facility].

## 2013-01-02 ENCOUNTER — Other Ambulatory Visit: Payer: 59

## 2013-01-05 ENCOUNTER — Encounter: Payer: 59 | Admitting: Endocrinology

## 2013-01-15 ENCOUNTER — Other Ambulatory Visit: Payer: 59

## 2013-01-22 ENCOUNTER — Other Ambulatory Visit (INDEPENDENT_AMBULATORY_CARE_PROVIDER_SITE_OTHER): Payer: 59

## 2013-01-22 ENCOUNTER — Other Ambulatory Visit: Payer: Self-pay | Admitting: Endocrinology

## 2013-01-22 DIAGNOSIS — E109 Type 1 diabetes mellitus without complications: Secondary | ICD-10-CM

## 2013-01-22 DIAGNOSIS — Z0000 Encounter for general adult medical examination without abnormal findings: Secondary | ICD-10-CM

## 2013-01-22 DIAGNOSIS — Z125 Encounter for screening for malignant neoplasm of prostate: Secondary | ICD-10-CM

## 2013-01-22 LAB — CBC WITH DIFFERENTIAL/PLATELET
Basophils Absolute: 0 10*3/uL (ref 0.0–0.1)
Basophils Relative: 0.5 % (ref 0.0–3.0)
EOS PCT: 1.3 % (ref 0.0–5.0)
Eosinophils Absolute: 0.1 10*3/uL (ref 0.0–0.7)
HEMATOCRIT: 44.6 % (ref 39.0–52.0)
HEMOGLOBIN: 15.4 g/dL (ref 13.0–17.0)
LYMPHS ABS: 1.8 10*3/uL (ref 0.7–4.0)
LYMPHS PCT: 29.7 % (ref 12.0–46.0)
MCHC: 34.6 g/dL (ref 30.0–36.0)
MCV: 92 fl (ref 78.0–100.0)
MONOS PCT: 8.2 % (ref 3.0–12.0)
Monocytes Absolute: 0.5 10*3/uL (ref 0.1–1.0)
Neutro Abs: 3.8 10*3/uL (ref 1.4–7.7)
Neutrophils Relative %: 60.3 % (ref 43.0–77.0)
PLATELETS: 262 10*3/uL (ref 150.0–400.0)
RBC: 4.85 Mil/uL (ref 4.22–5.81)
RDW: 14 % (ref 11.5–14.6)
WBC: 6.2 10*3/uL (ref 4.5–10.5)

## 2013-01-22 LAB — URINALYSIS, ROUTINE W REFLEX MICROSCOPIC
BILIRUBIN URINE: NEGATIVE
Hgb urine dipstick: NEGATIVE
Ketones, ur: 15 — AB
Leukocytes, UA: NEGATIVE
Nitrite: NEGATIVE
PH: 7.5 (ref 5.0–8.0)
RBC / HPF: NONE SEEN (ref 0–?)
Specific Gravity, Urine: 1.015 (ref 1.000–1.030)
TOTAL PROTEIN, URINE-UPE24: NEGATIVE
Urine Glucose: NEGATIVE
Urobilinogen, UA: 0.2 (ref 0.0–1.0)

## 2013-01-22 LAB — LIPID PANEL
Cholesterol: 205 mg/dL — ABNORMAL HIGH (ref 0–200)
HDL: 75.1 mg/dL (ref >39.00)
Total CHOL/HDL Ratio: 3
Triglycerides: 62 mg/dL (ref 0.0–149.0)
VLDL: 12.4 mg/dL (ref 0.0–40.0)

## 2013-01-22 LAB — LDL CHOLESTEROL, DIRECT: Direct LDL: 123.2 mg/dL

## 2013-01-22 LAB — BASIC METABOLIC PANEL
BUN: 14 mg/dL (ref 6–23)
CHLORIDE: 105 meq/L (ref 96–112)
CO2: 25 mEq/L (ref 19–32)
CREATININE: 0.8 mg/dL (ref 0.4–1.5)
Calcium: 9.2 mg/dL (ref 8.4–10.5)
GFR: 113.07 mL/min (ref >60.00)
Glucose, Bld: 130 mg/dL — ABNORMAL HIGH (ref 70–99)
Potassium: 4.2 mEq/L (ref 3.5–5.1)
Sodium: 140 mEq/L (ref 135–145)

## 2013-01-22 LAB — HEPATIC FUNCTION PANEL
ALBUMIN: 4.2 g/dL (ref 3.5–5.2)
ALK PHOS: 71 U/L (ref 39–117)
ALT: 19 U/L (ref 0–53)
AST: 15 U/L (ref 0–37)
Bilirubin, Direct: 0.2 mg/dL (ref 0.0–0.3)
TOTAL PROTEIN: 6.9 g/dL (ref 6.0–8.3)
Total Bilirubin: 0.8 mg/dL (ref 0.3–1.2)

## 2013-01-22 LAB — PSA: PSA: 4.45 ng/mL — AB (ref 0.10–4.00)

## 2013-01-22 LAB — HEMOGLOBIN A1C: HEMOGLOBIN A1C: 9.2 % — AB (ref 4.6–6.5)

## 2013-01-22 LAB — TSH: TSH: 0.92 u[IU]/mL (ref 0.35–5.50)

## 2013-01-23 LAB — MICROALBUMIN / CREATININE URINE RATIO
CREATININE, U: 110.8 mg/dL
Microalb Creat Ratio: 0.4 mg/g (ref 0.0–30.0)
Microalb, Ur: 0.4 mg/dL (ref 0.0–1.9)

## 2013-01-26 ENCOUNTER — Ambulatory Visit (INDEPENDENT_AMBULATORY_CARE_PROVIDER_SITE_OTHER): Payer: 59 | Admitting: Endocrinology

## 2013-01-26 ENCOUNTER — Encounter: Payer: Self-pay | Admitting: Endocrinology

## 2013-01-26 VITALS — BP 122/70 | HR 76 | Temp 98.6°F | Ht 68.0 in | Wt 195.0 lb

## 2013-01-26 DIAGNOSIS — E108 Type 1 diabetes mellitus with unspecified complications: Secondary | ICD-10-CM

## 2013-01-26 DIAGNOSIS — Z0000 Encounter for general adult medical examination without abnormal findings: Secondary | ICD-10-CM

## 2013-01-26 DIAGNOSIS — R972 Elevated prostate specific antigen [PSA]: Secondary | ICD-10-CM

## 2013-01-26 DIAGNOSIS — E109 Type 1 diabetes mellitus without complications: Secondary | ICD-10-CM

## 2013-01-26 DIAGNOSIS — I2510 Atherosclerotic heart disease of native coronary artery without angina pectoris: Secondary | ICD-10-CM

## 2013-01-26 NOTE — Progress Notes (Signed)
Subjective:    Patient ID: Donald Lopez, male    DOB: March 12, 1955, 58 y.o.   MRN: [id number]  HPI Pt is here for regular wellness examination, and is feeling pretty well in general, and says chronic med probs are stable, except as noted below Past Medical History  Diagnosis Date  . DIABETIC  RETINOPATHY 11/17/2006  . 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 POLYP, GALLBLADDER 11/17/2006  . TOBACCO USE, QUIT 11/17/2006  . VARICOSE VEINS, LOWER EXTREMITIES 11/17/2006  . HYPERLIPIDEMIA 11/17/2006  . DIABETES MELLITUS, TYPE I 11/17/2006    Past Surgical History  Procedure Laterality Date  . Vasectomy    . Vitrectomy  2009    Right  . Knee arthroscopy      Left  . Shoulder surgery      Left    History   Social History  . Marital Status: Married    Spouse Name: Jamie Brookes Prendergrast    Number of Children: 3  . Years of Education: N/A   Occupational History  . Airline pilot    Social History Main Topics  . Smoking status: Passive Smoke Exposure - Never Smoker    Types: Cigars  . Smokeless tobacco: Not on file     Comment: only occas. smoker.  . Alcohol Use: Yes     Comment: 5-6/week  . Drug Use: No  . Sexual Activity: Not on file   Other Topics Concern  . Not on file   Social History Narrative  . No narrative on file    Current Outpatient Prescriptions on File Prior to Visit  Medication Sig Dispense Refill  . aspirin 325 MG tablet Take 325 mg by mouth daily.        . Blood Glucose Monitoring Suppl (ONE TOUCH ULTRA MINI) W/DEVICE KIT 1 Device by Does not apply route once.  1 each  0  . glucose blood (ONE TOUCH ULTRA TEST) test strip Use as instructed to check blood sugar five times daily dx 250.03  500 each  2  . guaiFENesin (MUCINEX) 600 MG 12 hr tablet Take 1,200 mg by mouth 2 (two) times daily.      . insulin glargine (LANTUS) 100 UNIT/ML injection Inject 0.16 mLs (16 Units total) into the skin at bedtime.  10 mL  3  . insulin lispro (HUMALOG) 100 UNIT/ML injection Inject  into the skin 3 (three) times daily before meals. humalog 3 times a day (just before each meal) 5-7-6 units      . Insulin Syringe-Needle U-100 (B-D INS SYR ULTRAFINE .3CC/30G) 30G X 1/2" 0.3 ML MISC Use as directed Four times a day Dx code: 250.03  400 each  2  . pravastatin (PRAVACHOL) 80 MG tablet Take 1 tablet at bedtime  90 tablet  1   No current facility-administered medications on file prior to visit.    Allergies  Allergen Reactions  . Penicillins     REACTION: unspecified reaction    Family History  Problem Relation Age of Onset  . Cancer Father   . Hypertension Mother     BP 122/70  Pulse 76  Temp(Src) 98.6 F (37 C) (Oral)  Ht _0  (1.727 m)  Wt 195 lb (88.451 kg)  BMI 29.66 kg/m2  SpO2 97%  Review of Systems  Constitutional: Negative for fever.  HENT: Negative for hearing loss.   Eyes: Negative for visual disturbance.  Respiratory: Negative for shortness of breath.   Cardiovascular: Negative for chest pain.  Gastrointestinal: Negative for anal bleeding.  Endocrine: Negative for cold intolerance.  Genitourinary: Negative for hematuria.  Musculoskeletal: Negative for back pain.  Skin: Negative for rash.  Allergic/Immunologic: Negative for environmental allergies.  Neurological: Negative for numbness.  Hematological: Bruises/bleeds easily.  Psychiatric/Behavioral: Negative for dysphoric mood.      Objective:   Physical Exam VS: see vs page GEN: no distress HEAD: head: no deformity eyes: no periorbital swelling, no proptosis external nose and ears are normal mouth: no lesion seen NECK: supple, thyroid is not enlarged CHEST WALL: no deformity LUNGS: clear to auscultation BREASTS:  No gynecomastia CV: reg rate and rhythm, no murmur ABD: abdomen is soft, nontender.  no hepatosplenomegaly.  not distended.  no hernia GENITALIA/RECTAL/PROSTATE:  sees urology.   MUSCULOSKELETAL: muscle bulk and strength are grossly normal.  no obvious joint swelling.   gait is normal and steady PULSES:  no carotid bruit NEURO:  cn 2-12 grossly intact.   readily moves all 4's.  SKIN:  Normal texture and temperature.  No rash or suspicious lesion is visible.   NODES:  None palpable at the neck PSYCH: alert, well-oriented.  Does not appear anxious nor depressed.   i reviewed electrocardiogram    Assessment & Plan:  Wellness visit today, with problems stable, except as noted. we discussed code status.  pt requests full code, but would not want to be started or maintained on artificial life-support measures if there was not a reasonable chance of recovery.    SEPARATE EVALUATION FOLLOWS--EACH PROBLEM HERE IS NEW, NOT RESPONDING TO TREATMENT, OR POSES SIGNIFICANT RISK TO THE PATIENT'S HEALTH: HISTORY OF THE PRESENT ILLNESS: Pt returns for f/u of type 1 DM (dx'ed 1985; he has mild if any neuropathy of the lower extremities; he has associated CAD and proliferative retinopathy; he has declined pump and continuous glucose monitor; he has never had severe hypoglycemia or DKA).  He says he seldom misses insulin doses.  no cbg record, but states cbg's are extremely variable.    Pt says he inconsistently takes pravachol.   Pt was noted in 2014 to have elev PSA.  He saw urol at [facility], who recommended, to follow PSA. PAST MEDICAL HISTORY reviewed and up to date today REVIEW OF SYSTEMS: He seldom has hypoglycemia.  He has gained a few lbs.   PHYSICAL EXAMINATION: VITAL SIGNS:  See vs page GENERAL: no distress LAB/XRAY RESULTS: Lab Results  Component Value Date   HGBA1C 9.2* 01/22/2013   Lab Results  Component Value Date   CHOL 205* 01/22/2013   HDL 75.10 01/22/2013   LDLCALC 109* 10/09/2012   LDLDIRECT 123.2 01/22/2013   TRIG 62.0 01/22/2013   CHOLHDL 3 01/22/2013  IMPRESSION: DM: therapy limited by noncompliance with cbg recording.  i'll do the best i can. Dyslipidemia: therapy limited by noncompliance.  i'll do the best i can. elev PSA, persistent PLAN: See  instruction page We'll follow the PSA

## 2013-01-26 NOTE — Patient Instructions (Signed)
please consider these measures for your health:  minimize alcohol.  do not use tobacco products.  have a colonoscopy at least every 10 years from age 58.  keep firearms safely stored.  always use seat belts.  have working smoke alarms in your home.  see an eye doctor and dentist regularly.  never drive under the influence of alcohol or drugs (including prescription drugs).  those with fair skin should take precautions against the sun. Please come back for a follow-up appointment in 3 months Please take the pravastatin daily. check your blood sugar 4 times a day.  vary the time of day when you check, between before the 3 meals, and at bedtime.  also check if you have symptoms of your blood sugar being too high or too low.  please keep a record of the readings and bring it to your next appointment here.  You can write it on any piece of paper.  please call us sooner if your blood sugar goes below 70, or if you have a lot of readings over 200.

## 2013-01-27 DIAGNOSIS — E108 Type 1 diabetes mellitus with unspecified complications: Secondary | ICD-10-CM | POA: Insufficient documentation

## 2013-04-30 ENCOUNTER — Encounter: Payer: Self-pay | Admitting: Endocrinology

## 2013-04-30 ENCOUNTER — Ambulatory Visit (INDEPENDENT_AMBULATORY_CARE_PROVIDER_SITE_OTHER): Payer: 59 | Admitting: Endocrinology

## 2013-04-30 VITALS — BP 124/72 | HR 84 | Temp 97.5°F | Ht 68.0 in | Wt 193.0 lb

## 2013-04-30 DIAGNOSIS — E108 Type 1 diabetes mellitus with unspecified complications: Secondary | ICD-10-CM

## 2013-04-30 DIAGNOSIS — R972 Elevated prostate specific antigen [PSA]: Secondary | ICD-10-CM

## 2013-04-30 NOTE — Progress Notes (Signed)
 Subjective:    Patient ID: Donald Lopez, male    DOB: 10/28/1955, 57 y.o.   MRN: [id number]  HPI Pt returns for f/u of type 1 DM (dx'ed 1985; he has mild if any neuropathy of the lower extremities; he has associated CAD and proliferative retinopathy; he has declined pump and continuous glucose monitor; he has never had severe hypoglycemia or DKA).  He says he seldom misses insulin doses.  no cbg record, but states cbg's are extremely variable.  He takes a varying dosage of insulin.   Pt says he was advised to do prostate bx, but he did not receive a phone-call for the appt.   Pt says he now takes the pravachol as rx'ed.   Past Medical History  Diagnosis Date  . DIABETIC  RETINOPATHY 11/17/2006  . 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 POLYP, GALLBLADDER 11/17/2006  . TOBACCO USE, QUIT 11/17/2006  . VARICOSE VEINS, LOWER EXTREMITIES 11/17/2006  . HYPERLIPIDEMIA 11/17/2006  . DIABETES MELLITUS, TYPE I 11/17/2006    Past Surgical History  Procedure Laterality Date  . Vasectomy    . Vitrectomy  2009    Right  . Knee arthroscopy      Left  . Shoulder surgery      Left    History   Social History  . Marital Status: Married    Spouse Name: Tresea Prendergrast    Number of Children: 3  . 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 Smoking status: Passive Smoke Exposure - Never Smoker    Types: Cigars  . Smokeless tobacco: Not on file     Comment: only occas. smoker.  . Alcohol Use: Yes     Comment: 5-6/week  . Drug Use: No  . Sexual Activity: Not on file   Other Topics Concern  . Not on file   Social History Narrative  . No narrative on file    Current Outpatient Prescriptions on File Prior to Visit  Medication Sig Dispense Refill  . aspirin 325 MG tablet Take 325 mg by mouth daily.        . Blood Glucose Monitoring Suppl (ONE TOUCH ULTRA MINI) W/DEVICE KIT 1 Device by Does not apply route once.  1 each  0  . glucose  blood (ONE TOUCH ULTRA TEST) test strip Use as instructed to check blood sugar five times daily dx 250.03  500 each  2  . guaiFENesin (MUCINEX) 600 MG 12 hr tablet Take 1,200 mg by mouth 2 (two) times daily.      . insulin glargine (LANTUS) 100 UNIT/ML injection Inject 0.16 mLs (16 Units total) into the skin at bedtime.  10 mL  3  . insulin lispro (HUMALOG) 100 UNIT/ML injection Inject into the skin 3 (three) times daily before meals. humalog 3 times a day (just before each meal) 4-5-5 units      . Insulin Syringe-Needle U-100 (B-D INS SYR ULTRAFINE .3CC/30G) 30G X 1/2" 0.3 ML MISC Use as directed Four times a day Dx code: 250.03  400 each  2  . pravastatin (PRAVACHOL) 80 MG tablet Take 1 tablet at bedtime  90 tablet  1   No current facility-administered medications on file prior to visit.    Allergies  Allergen Reactions  . Penicillins     REACTION: unspecified reaction    Family History  Problem Relation Age of Onset  . Cancer Father   . Hypertension Mother       BP 124/72  Pulse 84  Temp(Src) 97.5 F (36.4 C) (Oral)  Ht 5' 8" (1.727 m)  Wt 193 lb (87.544 kg)  BMI 29.35 kg/m2  SpO2 97%  Review of Systems He denies hypoglycemia and weight change    Objective:   Physical Exam VITAL SIGNS:  See vs page GENERAL: no distress SKIN:  Insulin injection sites at the anterior abdomen are normal, except for a few ecchymoses.     Assessment & Plan:  DM: therapy limited by noncompliance with cbg recording, and by variable cbg's.  i'll do the best i can. Dyslipidemia: compliance is improved. Elevated PSA, persistent  

## 2013-04-30 NOTE — Patient Instructions (Addendum)
blood tests are being requested for you today.  We'll contact you with results.   Please reduce the humalog to 3 times a day (just before each meal) 4-5-5 units.  Also, add 1 units for a blood sugar in the 200's, and 2 units if over 300.   Refer back to Dr Amalia Hailey.  you will receive a phone call, about a day and time for an appointment.   check your blood sugar 5 times a day: before the 3 meals, and at bedtime.  also check if you have symptoms of your blood sugar being too high or too low.  please keep a record of the readings and bring it to your next appointment here.  You can write it on any piece of paper.  please call us sooner if your blood sugar goes below 70, or if you have a lot of readings over 200.

## 2013-05-04 ENCOUNTER — Other Ambulatory Visit: Payer: 59

## 2013-05-14 ENCOUNTER — Other Ambulatory Visit: Payer: 59

## 2013-05-14 ENCOUNTER — Telehealth: Payer: Self-pay | Admitting: Endocrinology

## 2013-05-14 NOTE — Telephone Encounter (Signed)
Pt declined appt to see Dr Amalia Hailey on 05/16/13

## 2013-06-07 DIAGNOSIS — H4311 Vitreous hemorrhage, right eye: Secondary | ICD-10-CM | POA: Insufficient documentation

## 2013-07-13 ENCOUNTER — Other Ambulatory Visit: Payer: 59

## 2013-07-19 ENCOUNTER — Other Ambulatory Visit (INDEPENDENT_AMBULATORY_CARE_PROVIDER_SITE_OTHER): Payer: 59

## 2013-07-19 DIAGNOSIS — E108 Type 1 diabetes mellitus with unspecified complications: Secondary | ICD-10-CM

## 2013-07-19 LAB — LIPID PANEL
Cholesterol: 205 mg/dL — ABNORMAL HIGH (ref 0–200)
HDL: 73.4 mg/dL (ref >39.00)
LDL Cholesterol: 125 mg/dL — ABNORMAL HIGH (ref 0–99)
NONHDL: 131.6
Total CHOL/HDL Ratio: 3
Triglycerides: 34 mg/dL (ref 0.0–149.0)
VLDL: 6.8 mg/dL (ref 0.0–40.0)

## 2013-07-19 LAB — HEMOGLOBIN A1C: HEMOGLOBIN A1C: 9.2 % — AB (ref 4.6–6.5)

## 2013-07-30 ENCOUNTER — Ambulatory Visit (INDEPENDENT_AMBULATORY_CARE_PROVIDER_SITE_OTHER): Payer: 59 | Admitting: Endocrinology

## 2013-07-30 ENCOUNTER — Encounter: Payer: Self-pay | Admitting: Endocrinology

## 2013-07-30 VITALS — BP 142/80 | HR 88 | Temp 97.8°F | Ht 68.0 in | Wt 192.0 lb

## 2013-07-30 DIAGNOSIS — E108 Type 1 diabetes mellitus with unspecified complications: Secondary | ICD-10-CM

## 2013-07-30 MED ORDER — INSULIN LISPRO 100 UNIT/ML ~~LOC~~ SOLN: 2.0000 [IU] | Freq: Three times a day (TID) | SUBCUTANEOUS | Status: DC

## 2013-07-30 NOTE — Progress Notes (Signed)
 Subjective:    Patient ID: Donald Lopez, male    DOB: 01/12/1956, 58 y.o.   MRN: [id number]  HPI Pt returns for f/u of type 1 DM (dx'ed 1985, on a routine blood test; he has been on insulin since soon after dx; he has mild if any neuropathy of the lower extremities; he has associated CAD and proliferative retinopathy; he has declined pump and continuous glucose monitor; he has never had pancreatitis, severe hypoglycemia, or DKA).  He says he seldom misses insulin doses.  no cbg record, but states cbg's are extremely variable.  He takes a varying dosage of insulin.   He says it is seldom low, and these episodes are mild.   Past Medical History  Diagnosis Date  . DIABETIC  RETINOPATHY 11/17/2006  . 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 POLYP, GALLBLADDER 11/17/2006  . TOBACCO USE, QUIT 11/17/2006  . VARICOSE VEINS, LOWER EXTREMITIES 11/17/2006  . HYPERLIPIDEMIA 11/17/2006  . DIABETES MELLITUS, TYPE I 11/17/2006    Past Surgical History  Procedure Laterality Date  . Vasectomy    . Vitrectomy  2009    Right  . Knee arthroscopy      Left  . Shoulder surgery      Left    History   Social History  . Marital Status: Married    Spouse Name: Tresea Prendergrast    Number of Children: 3  . 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 Smoking status: Passive Smoke Exposure - Never Smoker    Types: Cigars  . Smokeless tobacco: Not on file     Comment: only occas. smoker.  . Alcohol Use: Yes     Comment: 5-6/week  . Drug Use: No  . Sexual Activity: Not on file   Other Topics Concern  . Not on file   Social History Narrative  . No narrative on file    Current Outpatient Prescriptions on File Prior to Visit  Medication Sig Dispense Refill  . aspirin 325 MG tablet Take 325 mg by mouth daily.        . Blood Glucose Monitoring Suppl (ONE TOUCH ULTRA MINI) W/DEVICE KIT 1 Device by Does not apply route once.  1 each  0  . glucose blood  (ONE TOUCH ULTRA TEST) test strip Use as instructed to check blood sugar five times daily dx 250.03  500 each  2  . guaiFENesin (MUCINEX) 600 MG 12 hr tablet Take 1,200 mg by mouth 2 (two) times daily.      . Insulin Syringe-Needle U-100 (B-D INS SYR ULTRAFINE .3CC/30G) 30G X 1/2" 0.3 ML MISC Use as directed Four times a day Dx code: 250.03  400 each  2  . pravastatin (PRAVACHOL) 80 MG tablet Take 1 tablet at bedtime  90 tablet  1   No current facility-administered medications on file prior to visit.    Allergies  Allergen Reactions  . Penicillins     REACTION: unspecified reaction    Family History  Problem Relation Age of Onset  . Cancer Father   . Hypertension Mother     BP 142/80  Pulse 88  Temp(Src) 97.8 F (36.6 C) (Oral)  Ht 5' 8" (1.727 m)  Wt 192 lb (87.091 kg)  BMI 29.20 kg/m2  SpO2 97%    Review of Systems Denies LOC and chest pain.      Objective:   Physical Exam VITAL SIGNS:  See vs   page GENERAL: no distress.   Pulses: dorsalis pedis intact bilat.   Feet: no deformity. normal color and temp.  no edema.  There are bilat varicosities.   Skin:  no ulcer on the feet.   Neuro: sensation is intact to touch on the feet.    Lab Results  Component Value Date   HGBA1C 9.2* 07/19/2013      Assessment & Plan:  DM: severe exacerbation.  Noncompliance with cbg recording, persistent: I'll work around this as best I can.  We discussed the possibility of taking a simpler qd insulin regimen, but we decided to take an intermediate step today.    Patient is advised the following: Patient Instructions  blood tests are being requested for you today.  We'll contact you with results.   Please reduce the humalog to 2 units, 3 times a day (just before each meal).  Also, add 1 units for a blood sugar in the 200's, and 2 units if over 300.   check your blood sugar 5 times a day: before the 3 meals, and at bedtime.  also check if you have symptoms of your blood sugar being too  high or too low.  please keep a record of the readings and bring it to your next appointment here.  You can write it on any piece of paper.  please call us sooner if your blood sugar goes below 70, or if you have a lot of readings over 200.   Please increase the lantus to 25 units daily, and take it in the morning.  Please come back for a follow-up appointment in 3 months.   On this type of insulin schedule, you should eat meals on a regular schedule.  If a meal is missed or significantly delayed, your blood sugar could go low.       

## 2013-07-30 NOTE — Patient Instructions (Addendum)
blood tests are being requested for you today.  We'll contact you with results.   Please reduce the humalog to 2 units, 3 times a day (just before each meal).  Also, add 1 units for a blood sugar in the 200's, and 2 units if over 300.   check your blood sugar 5 times a day: before the 3 meals, and at bedtime.  also check if you have symptoms of your blood sugar being too high or too low.  please keep a record of the readings and bring it to your next appointment here.  You can write it on any piece of paper.  please call us sooner if your blood sugar goes below 70, or if you have a lot of readings over 200.   Please increase the lantus to 25 units daily, and take it in the morning.  Please come back for a follow-up appointment in 3 months.   On this type of insulin schedule, you should eat meals on a regular schedule.  If a meal is missed or significantly delayed, your blood sugar could go low.

## 2013-09-29 ENCOUNTER — Other Ambulatory Visit: Payer: Self-pay | Admitting: Endocrinology

## 2013-10-29 ENCOUNTER — Ambulatory Visit: Payer: 59 | Admitting: Endocrinology

## 2013-11-19 ENCOUNTER — Encounter: Payer: Self-pay | Admitting: Endocrinology

## 2013-11-19 ENCOUNTER — Ambulatory Visit (INDEPENDENT_AMBULATORY_CARE_PROVIDER_SITE_OTHER): Payer: 59 | Admitting: Endocrinology

## 2013-11-19 VITALS — BP 134/84 | HR 84 | Temp 97.9°F | Ht 68.0 in | Wt 197.0 lb

## 2013-11-19 DIAGNOSIS — Z23 Encounter for immunization: Secondary | ICD-10-CM

## 2013-11-19 DIAGNOSIS — E108 Type 1 diabetes mellitus with unspecified complications: Secondary | ICD-10-CM

## 2013-11-19 DIAGNOSIS — E10359 Type 1 diabetes mellitus with proliferative diabetic retinopathy without macular edema: Secondary | ICD-10-CM

## 2013-11-19 DIAGNOSIS — E103599 Type 1 diabetes mellitus with proliferative diabetic retinopathy without macular edema, unspecified eye: Secondary | ICD-10-CM

## 2013-11-19 LAB — HEMOGLOBIN A1C: HEMOGLOBIN A1C: 9.3 % — AB (ref 4.6–6.5)

## 2013-11-19 NOTE — Patient Instructions (Addendum)
Please come back for a regular physical appointment in 3 months. blood tests are being requested for you today.  We'll contact you with results.    check your blood sugar 5 times a day: before the 3 meals, and at bedtime.  also check if you have symptoms of your blood sugar being too high or too low.  please keep a record of the readings and bring it to your next appointment here.  You can write it on any piece of paper.  please call us sooner if your blood sugar goes below 70, or if you have a lot of readings over 200.   On this type of insulin schedule, you should eat meals on a regular schedule.  If a meal is missed or significantly delayed, your blood sugar could go low.

## 2013-11-19 NOTE — Progress Notes (Signed)
Subjective:    Patient ID: Donald Lopez, male    DOB: 06/04/1955, 58 y.o.   MRN: [id number]  HPI  Pt returns for f/u of diabetes mellitus: DM type: 1 Dx'ed: 7782 Complications: CAD and proliferative retinopathy Therapy: insulin since soon after dx.  DKA: never Severe hypoglycemia: never Pancreatitis: never Other: he has declined pump and continuous glucose monitor Interval history: no cbg record, but states cbg's are mildly low a few times per week.  This usually happens with with exercise, but not necessarily.  pt states he feels well in general. Past Medical History  Diagnosis Date  . DIABETIC  RETINOPATHY 11/17/2006  . 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 POLYP, GALLBLADDER 11/17/2006  . TOBACCO USE, QUIT 11/17/2006  . VARICOSE VEINS, LOWER EXTREMITIES 11/17/2006  . HYPERLIPIDEMIA 11/17/2006  . DIABETES MELLITUS, TYPE I 11/17/2006    Past Surgical History  Procedure Laterality Date  . Vasectomy    . Vitrectomy  2009    Right  . Knee arthroscopy      Left  . Shoulder surgery      Left    History   Social History  . Marital Status: Married    Spouse Name: Jamie Brookes Prendergrast    Number of Children: 3  . Years of Education: N/A   Occupational History  . Airline pilot    Social History Main Topics  . Smoking status: Passive Smoke Exposure - Never Smoker    Types: Cigars  . Smokeless tobacco: Not on file     Comment: only occas. smoker.  . Alcohol Use: Yes     Comment: 5-6/week  . Drug Use: No  . Sexual Activity: Not on file   Other Topics Concern  . Not on file   Social History Narrative    Current Outpatient Prescriptions on File Prior to Visit  Medication Sig Dispense Refill  . aspirin 325 MG tablet Take 325 mg by mouth daily.      . Blood Glucose Monitoring Suppl (ONE TOUCH ULTRA MINI) W/DEVICE KIT 1 Device by Does not apply route once. 1 each 0  . glucose blood (ONE TOUCH ULTRA TEST) test strip Use as instructed to check blood sugar five times daily dx  250.03 500 each 2  . guaiFENesin (MUCINEX) 600 MG 12 hr tablet Take 1,200 mg by mouth 2 (two) times daily.    . insulin lispro (HUMALOG) 100 UNIT/ML injection Inject 0.02 mLs (2 Units total) into the skin 3 (three) times daily before meals. 10 mL 11  . Insulin Syringe-Needle U-100 (B-D INS SYR ULTRAFINE .3CC/30G) 30G X 1/2" 0.3 ML MISC Use as directed Four times a day Dx code: 250.03 400 each 2  . pravastatin (PRAVACHOL) 80 MG tablet Take 1 tablet at bedtime 90 tablet 1   No current facility-administered medications on file prior to visit.    Allergies  Allergen Reactions  . Penicillins     REACTION: unspecified reaction    Family History  Problem Relation Age of Onset  . Cancer Father   . Hypertension Mother     BP 134/84 mmHg  Pulse 84  Temp(Src) 97.9 F (36.6 C) (Oral)  Ht _0  (1.727 m)  Wt 197 lb (89.359 kg)  BMI 29.96 kg/m2  SpO2 97%   Review of Systems Denies LOC and weight change.      Objective:   Physical Exam VITAL SIGNS:  See vs page GENERAL: no distress Pulses: dorsalis pedis intact bilat.   Feet: no deformity.  no  edema Skin:  no ulcer on the feet.  normal color and temp. There is spotty hyperpigmentation of the legs, and varicosities.   Neuro: sensation is intact to touch on the feet.     Lab Results  Component Value Date   HGBA1C 9.3* 11/19/2013      Assessment & Plan:  DM: persistently poor control, despite changing his regimen.  i advised pt to consider "V-Go" pump, but he declines.   Side-effect of rx: hypoglycemia, frequent.  Noncompliance with cbg recording: I'll work around this as best I can   Patient is advised the following: Patient Instructions  Please come back for a regular physical appointment in 3 months. blood tests are being requested for you today.  We'll contact you with results.    check your blood sugar 5 times a day: before the 3 meals, and at bedtime.  also check if you have symptoms of your blood sugar being too high  or too low.  please keep a record of the readings and bring it to your next appointment here.  You can write it on any piece of paper.  please call us sooner if your blood sugar goes below 70, or if you have a lot of readings over 200.   On this type of insulin schedule, you should eat meals on a regular schedule.  If a meal is missed or significantly delayed, your blood sugar could go low.     addendum: i told pt he can go back to his old regimen.

## 2013-12-08 ENCOUNTER — Other Ambulatory Visit: Payer: Self-pay | Admitting: Endocrinology

## 2013-12-21 ENCOUNTER — Telehealth: Payer: Self-pay | Admitting: Endocrinology

## 2013-12-21 DIAGNOSIS — E108 Type 1 diabetes mellitus with unspecified complications: Secondary | ICD-10-CM

## 2013-12-21 MED ORDER — "INSULIN SYRINGE-NEEDLE U-100 30G X 1/2"" 0.3 ML MISC": Status: DC

## 2013-12-21 NOTE — Telephone Encounter (Signed)
Ok, please refill prn 

## 2013-12-21 NOTE — Telephone Encounter (Signed)
Please send syringes to mail order pharmacy    Thank you

## 2013-12-21 NOTE — Telephone Encounter (Signed)
Rx sent to pharmacy   

## 2014-01-04 ENCOUNTER — Telehealth: Payer: Self-pay | Admitting: Endocrinology

## 2014-01-04 MED ORDER — INSULIN LISPRO 100 UNIT/ML ~~LOC~~ SOLN: 2.0000 [IU] | Freq: Three times a day (TID) | SUBCUTANEOUS | Status: DC

## 2014-01-04 MED ORDER — PRAVASTATIN SODIUM 80 MG PO TABS: ORAL_TABLET | ORAL | Status: DC

## 2014-01-04 MED ORDER — INSULIN GLARGINE 100 UNIT/ML ~~LOC~~ SOLN: SUBCUTANEOUS | Status: DC

## 2014-01-04 NOTE — Telephone Encounter (Signed)
Patient need refill of Lantus, Humalog, Pravastatin,sent to [facility]

## 2014-01-04 NOTE — Telephone Encounter (Signed)
Rx sent to pharmacy   

## 2014-04-22 ENCOUNTER — Encounter: Payer: Self-pay | Admitting: Gastroenterology

## 2014-05-05 ENCOUNTER — Other Ambulatory Visit: Payer: Self-pay | Admitting: Endocrinology

## 2014-05-06 ENCOUNTER — Other Ambulatory Visit: Payer: Self-pay

## 2014-05-06 DIAGNOSIS — E108 Type 1 diabetes mellitus with unspecified complications: Secondary | ICD-10-CM

## 2014-05-06 DIAGNOSIS — E109 Type 1 diabetes mellitus without complications: Secondary | ICD-10-CM

## 2014-05-06 MED ORDER — "INSULIN SYRINGE-NEEDLE U-100 30G X 1/2"" 0.3 ML MISC": Status: DC

## 2014-05-06 MED ORDER — GLUCOSE BLOOD VI STRP: ORAL_STRIP | Status: DC

## 2014-05-09 ENCOUNTER — Other Ambulatory Visit: Payer: Self-pay

## 2014-05-09 MED ORDER — ACCU-CHEK NANO SMARTVIEW W/DEVICE KIT: PACK | Status: DC

## 2014-05-09 MED ORDER — GLUCOSE BLOOD VI STRP: ORAL_STRIP | Status: DC

## 2014-05-13 ENCOUNTER — Other Ambulatory Visit: Payer: Self-pay

## 2014-05-13 MED ORDER — GLUCOSE BLOOD VI STRP: ORAL_STRIP | Status: DC

## 2014-06-10 ENCOUNTER — Other Ambulatory Visit: Payer: Self-pay

## 2014-06-10 ENCOUNTER — Other Ambulatory Visit: Payer: Self-pay | Admitting: Endocrinology

## 2014-06-28 ENCOUNTER — Ambulatory Visit: Payer: 59 | Admitting: Endocrinology

## 2014-07-15 ENCOUNTER — Other Ambulatory Visit: Payer: Self-pay

## 2014-07-26 ENCOUNTER — Encounter: Payer: Self-pay | Admitting: Gastroenterology

## 2014-07-29 ENCOUNTER — Encounter: Payer: Self-pay | Admitting: Endocrinology

## 2014-07-29 ENCOUNTER — Ambulatory Visit (INDEPENDENT_AMBULATORY_CARE_PROVIDER_SITE_OTHER): Payer: 59 | Admitting: Endocrinology

## 2014-07-29 VITALS — BP 142/70 | HR 84 | Temp 97.9°F | Ht 68.0 in | Wt 193.0 lb

## 2014-07-29 DIAGNOSIS — E108 Type 1 diabetes mellitus with unspecified complications: Secondary | ICD-10-CM | POA: Diagnosis not present

## 2014-07-29 LAB — POCT GLYCOSYLATED HEMOGLOBIN (HGB A1C): Hemoglobin A1C: 9.4

## 2014-07-29 NOTE — Patient Instructions (Addendum)
Please come back for a regular physical appointment in 3 months.  blood tests are being requested for you today.  We'll contact you with results.  Please increase the lantus to 25 units each morning, and:  Take humalog 2 units 3 times a day (just before each meal).  You can take 1-2 extra units for a large meal or cbg over 200.   check your blood sugar 5 times a day: before the 3 meals, and at bedtime.  also check if you have symptoms of your blood sugar being too high or too low.  please keep a record of the readings and bring it to your next appointment here.  You can write it on any piece of paper.  please call us sooner if your blood sugar goes below 70, or if you have a lot of readings over 200.   On this type of insulin schedule, you should eat meals on a regular schedule.  If a meal is missed or significantly delayed, your blood sugar could go low.

## 2014-07-29 NOTE — Progress Notes (Signed)
Subjective:    Patient ID: ODYSSEUS CADA, male    DOB: 06-17-1955, 59 y.o.   MRN: [id number]  HPI Pt returns for f/u of diabetes mellitus: DM type: 1 Dx'ed: 8144 Complications: CAD and proliferative retinopathy Therapy: insulin since soon after dx.  DKA: never Severe hypoglycemia: never Pancreatitis: never Other: he has declined pump and continuous glucose monitor. Interval history: He was recently dx'ed with prostate cancer.  He says his activity os very good recently.  no cbg record, but states cbg's are mildly low approx twice a week.  This happens with activity, or with taking extra humalog for high cbg.  He takes lantus, 22 units qam.  He takes a varying dosage of humalog averaging approx 15 units total per day.   Past Medical History  Diagnosis Date  . DIABETIC  RETINOPATHY 11/17/2006  . 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 POLYP, GALLBLADDER 11/17/2006  . TOBACCO USE, QUIT 11/17/2006  . VARICOSE VEINS, LOWER EXTREMITIES 11/17/2006  . HYPERLIPIDEMIA 11/17/2006  . DIABETES MELLITUS, TYPE I 11/17/2006    Past Surgical History  Procedure Laterality Date  . Vasectomy    . Vitrectomy  2009    Right  . Knee arthroscopy      Left  . Shoulder surgery      Left    History   Social History  . Marital Status: Married    Spouse Name: Presenter, broadcasting  . Number of Children: 3  . Years of Education: N/A   Occupational History  . Airline pilot    Social History Main Topics  . Smoking status: Passive Smoke Exposure - Never Smoker    Types: Cigars  . Smokeless tobacco: Not on file     Comment: only occas. smoker.  . Alcohol Use: Yes     Comment: 5-6/week  . Drug Use: No  . Sexual Activity: Not on file   Other Topics Concern  . Not on file   Social History Narrative    Current Outpatient Prescriptions on File Prior to Visit  Medication Sig Dispense Refill  . aspirin 325 MG tablet Take 325 mg by mouth daily.      Marland Kitchen glucose blood (TRUETEST TEST) test strip Use to check  blood sugar 5 times per day. Dx code: E10.8 500 each 2  . guaiFENesin (MUCINEX) 600 MG 12 hr tablet Take 1,200 mg by mouth 2 (two) times daily.    . insulin lispro (HUMALOG) 100 UNIT/ML injection Inject 0.02 mLs (2 Units total) into the skin 3 (three) times daily before meals. 10 mL 11  . Insulin Syringe-Needle U-100 (B-D INS SYR ULTRAFINE .3CC/30G) 30G X 1/2" 0.3 ML MISC Use as directed Four times a day Dx code: E10.8 400 each 2  . pravastatin (PRAVACHOL) 80 MG tablet Take 1 tablet at bedtime 90 tablet 1   No current facility-administered medications on file prior to visit.    Allergies  Allergen Reactions  . Penicillins     REACTION: unspecified reaction    Family History  Problem Relation Age of Onset  . Cancer Father   . Hypertension Mother     BP 142/70 mmHg  Pulse 84  Temp(Src) 97.9 F (36.6 C) (Oral)  Ht 5\' 8"  (1.727 m)  Wt 193 lb (87.544 kg)  BMI 29.35 kg/m2  SpO2 95%  Review of Systems Denies LOC.      Objective:   Physical Exam VITAL SIGNS:  See vs page GENERAL: no distress Pulses: dorsalis pedis intact bilat.   MSK:  no deformity of the feet CV: trace bilat leg edema, and bilat varicosities.   Skin:  no ulcer on the feet.  normal color and temp on the feet. Neuro: sensation is intact to touch on the feet   A1c=9.4%    Assessment & Plan:  DM: with ongoing poor control  Patient is advised the following: Patient Instructions  Please come back for a regular physical appointment in 3 months.  blood tests are being requested for you today.  We'll contact you with results.  Please increase the lantus to 25 units each morning, and:  Take humalog 2 units 3 times a day (just before each meal).  You can take 1-2 extra units for a large meal or cbg over 200.   check your blood sugar 5 times a day: before the 3 meals, and at bedtime.  also check if you have symptoms of your blood sugar being too high or too low.  please keep a record of the readings and bring it to  your next appointment here.  You can write it on any piece of paper.  please call us sooner if your blood sugar goes below 70, or if you have a lot of readings over 200.   On this type of insulin schedule, you should eat meals on a regular schedule.  If a meal is missed or significantly delayed, your blood sugar could go low.

## 2014-07-30 DIAGNOSIS — C61 Malignant neoplasm of prostate: Secondary | ICD-10-CM

## 2014-07-30 HISTORY — DX: Malignant neoplasm of prostate: C61

## 2014-08-14 ENCOUNTER — Other Ambulatory Visit: Payer: Self-pay

## 2014-08-14 MED ORDER — GLUCOSE BLOOD VI STRP: ORAL_STRIP | Status: DC

## 2014-08-14 MED ORDER — ACCU-CHEK SOFTCLIX LANCETS MISC: Status: DC

## 2014-08-14 MED ORDER — ACCU-CHEK AVIVA PLUS W/DEVICE KIT: PACK | Status: DC

## 2014-09-04 ENCOUNTER — Other Ambulatory Visit: Payer: Self-pay

## 2014-09-04 MED ORDER — INSULIN GLARGINE 100 UNIT/ML SOLOSTAR PEN: 25.0000 [IU] | PEN_INJECTOR | SUBCUTANEOUS | Status: DC

## 2014-09-13 ENCOUNTER — Other Ambulatory Visit: Payer: Self-pay

## 2014-09-13 ENCOUNTER — Telehealth: Payer: Self-pay | Admitting: Endocrinology

## 2014-09-13 NOTE — Telephone Encounter (Signed)
Pt wants the lantus vial please call into [facility]

## 2014-09-13 NOTE — Telephone Encounter (Signed)
Rx has been called in verbally to [facility]. Marland Kitchen

## 2014-10-19 HISTORY — PX: PROSTATE SURGERY: SHX751

## 2014-10-24 DIAGNOSIS — E109 Type 1 diabetes mellitus without complications: Secondary | ICD-10-CM | POA: Insufficient documentation

## 2014-11-01 ENCOUNTER — Ambulatory Visit: Payer: 59 | Admitting: Endocrinology

## 2014-12-23 ENCOUNTER — Ambulatory Visit: Payer: 59 | Admitting: Endocrinology

## 2015-01-06 ENCOUNTER — Ambulatory Visit: Payer: 59 | Admitting: Endocrinology

## 2015-01-09 ENCOUNTER — Ambulatory Visit (INDEPENDENT_AMBULATORY_CARE_PROVIDER_SITE_OTHER): Payer: 59 | Admitting: Endocrinology

## 2015-01-09 ENCOUNTER — Encounter: Payer: Self-pay | Admitting: Endocrinology

## 2015-01-09 VITALS — BP 142/84 | HR 78 | Temp 97.6°F | Ht 68.0 in | Wt 200.0 lb

## 2015-01-09 DIAGNOSIS — I83009 Varicose veins of unspecified lower extremity with ulcer of unspecified site: Secondary | ICD-10-CM | POA: Insufficient documentation

## 2015-01-09 DIAGNOSIS — E103599 Type 1 diabetes mellitus with proliferative diabetic retinopathy without macular edema, unspecified eye: Secondary | ICD-10-CM | POA: Diagnosis not present

## 2015-01-09 DIAGNOSIS — I83029 Varicose veins of left lower extremity with ulcer of unspecified site: Secondary | ICD-10-CM | POA: Diagnosis not present

## 2015-01-09 DIAGNOSIS — L97929 Non-pressure chronic ulcer of unspecified part of left lower leg with unspecified severity: Secondary | ICD-10-CM

## 2015-01-09 DIAGNOSIS — E108 Type 1 diabetes mellitus with unspecified complications: Secondary | ICD-10-CM | POA: Diagnosis not present

## 2015-01-09 DIAGNOSIS — L97909 Non-pressure chronic ulcer of unspecified part of unspecified lower leg with unspecified severity: Secondary | ICD-10-CM

## 2015-01-09 DIAGNOSIS — Z0000 Encounter for general adult medical examination without abnormal findings: Secondary | ICD-10-CM

## 2015-01-09 LAB — POCT GLYCOSYLATED HEMOGLOBIN (HGB A1C): HEMOGLOBIN A1C: 8.6

## 2015-01-09 MED ORDER — SILDENAFIL CITRATE 20 MG PO TABS: 20.0000 mg | ORAL_TABLET | Freq: Three times a day (TID) | ORAL | Status: DC

## 2015-01-09 NOTE — Patient Instructions (Addendum)
Please come back for a regular physical appointment in 3 months.  blood tests are being requested for you today.  We'll contact you with results.  Please increase the lantus to 27 units each morning (but just 22 if you are going to be active).   Take humalog 2 units 3 times a day (just before each meal).  You can take 1-2 extra units for a large meal or cbg over 200.   check your blood sugar 5 times a day: before the 3 meals, and at bedtime.  also check if you have symptoms of your blood sugar being too high or too low.  please keep a record of the readings and bring it to your next appointment here.  You can write it on any piece of paper.  please call us sooner if your blood sugar goes below 70, or if you have a lot of readings over 200.   On this type of insulin schedule, you should eat meals on a regular schedule.  If a meal is missed or significantly delayed, your blood sugar could go low.   Please see a vein specialist.  you will receive a phone call, about a day and time for an appointment.

## 2015-01-09 NOTE — Progress Notes (Signed)
   Subjective:    Patient ID: Donald Lopez, male    DOB: Jun 03, 1955, 59 y.o.   MRN: [id number]  HPI Pt returns for f/u of diabetes mellitus: DM type: 1 Dx'ed: [id number] Complications: CAD and proliferative retinopathy Therapy: insulin since soon after dx.  DKA: never Severe hypoglycemia: never Pancreatitis: never.   Other: he has declined pump and continuous glucose monitor.  Interval history: He was recently dx'ed with prostate cancer.  He says his activity is very good recently.  no cbg record, but states cbg's are mildly low approx twice a week.  This happens with activity, or with taking extra humalog for high cbg.  He takes lantus, 22 units qam.  He takes a varying dosage of humalog averaging approx 15 units total per day.     He seldom has hypoglycemia, and these episodes are mild.  This happens with exercise.    Review of Systems Denies LOC    Objective:   Physical Exam VITAL SIGNS:  See vs page GENERAL: no distress Pulses: dorsalis pedis intact bilat.   MSK: no deformity of the feet CV: no leg edema on the right, and 1+ on the left. There are varicosities at the left ankle and foot.   Skin:  no ulcer on the feet.  normal color and temp on the feet. Neuro: sensation is intact to touch on the feet.       A1c=8.6%    Assessment & Plan:  DM: he needs increased rx, if it can be done with a regimen that avoids or minimizes hypoglycemia.  He is improving with a simpler insulin schedule.  The next step is for him to vary the lantus according to his anticipated exercise each day, as best he can.   CAD: in this context, he needs to avoid hypoglycemia.  Varicose veins, persistent   Patient is advised the following: Patient Instructions  Please come back for a regular physical appointment in 3 months.  blood tests are being requested for you today.  We'll contact you with results.  Please increase the lantus to 27 units each morning (but just 22 if you are going to be  active).   Take humalog 2 units 3 times a day (just before each meal).  You can take 1-2 extra units for a large meal or cbg over 200.   check your blood sugar 5 times a day: before the 3 meals, and at bedtime.  also check if you have symptoms of your blood sugar being too high or too low.  please keep a record of the readings and bring it to your next appointment here.  You can write it on any piece of paper.  please call us sooner if your blood sugar goes below 70, or if you have a lot of readings over 200.   On this type of insulin schedule, you should eat meals on a regular schedule.  If a meal is missed or significantly delayed, your blood sugar could go low.   Please see a vein specialist.  you will receive a phone call, about a day and time for an appointment.

## 2015-01-29 ENCOUNTER — Telehealth: Payer: Self-pay | Admitting: Endocrinology

## 2015-01-29 DIAGNOSIS — R21 Rash and other nonspecific skin eruption: Secondary | ICD-10-CM

## 2015-01-29 NOTE — Telephone Encounter (Signed)
Patient called stating that he is having skin irritation on his chest and would like to be referred to a dermatologist    Please advise    Thank you

## 2015-01-29 NOTE — Telephone Encounter (Signed)
See note below and please advise, Thanks! 

## 2015-01-29 NOTE — Telephone Encounter (Signed)
done

## 2015-01-29 NOTE — Telephone Encounter (Signed)
Pt advised referral has been placed.  

## 2015-02-04 ENCOUNTER — Telehealth: Payer: Self-pay | Admitting: Endocrinology

## 2015-02-04 NOTE — Telephone Encounter (Signed)
Patient stated that no one has called him to schedule his Dermatogly appointment. and [facility] do not have a Dermatologist in that office.

## 2015-02-04 NOTE — Telephone Encounter (Signed)
I contacted the pt and advise [facility] is still working on the dermatology referral. Pt was advised to call back on 02/07/2015 if he had not heard from [facility].

## 2015-02-10 ENCOUNTER — Other Ambulatory Visit: Payer: Self-pay | Admitting: *Deleted

## 2015-02-10 DIAGNOSIS — I83029 Varicose veins of left lower extremity with ulcer of unspecified site: Secondary | ICD-10-CM

## 2015-02-10 DIAGNOSIS — L97929 Non-pressure chronic ulcer of unspecified part of left lower leg with unspecified severity: Principal | ICD-10-CM

## 2015-02-21 ENCOUNTER — Encounter (HOSPITAL_COMMUNITY): Payer: 59

## 2015-02-21 ENCOUNTER — Encounter: Payer: 59 | Admitting: Vascular Surgery

## 2015-03-24 ENCOUNTER — Encounter: Payer: Self-pay | Admitting: Vascular Surgery

## 2015-03-28 ENCOUNTER — Encounter: Payer: Self-pay | Admitting: Vascular Surgery

## 2015-03-28 ENCOUNTER — Ambulatory Visit (INDEPENDENT_AMBULATORY_CARE_PROVIDER_SITE_OTHER): Payer: 59 | Admitting: Vascular Surgery

## 2015-03-28 ENCOUNTER — Ambulatory Visit (HOSPITAL_COMMUNITY)
Admission: RE | Admit: 2015-03-28 | Discharge: 2015-03-28 | Disposition: A | Discharge status: Home / Self Care | Payer: 59 | Source: Ambulatory Visit | Attending: Vascular Surgery | Admitting: Vascular Surgery

## 2015-03-28 VITALS — BP 161/84 | HR 97 | Temp 97.0°F | Resp 18 | Ht 67.25 in | Wt 198.9 lb

## 2015-03-28 DIAGNOSIS — I8392 Asymptomatic varicose veins of left lower extremity: Secondary | ICD-10-CM

## 2015-03-28 DIAGNOSIS — Z87891 Personal history of nicotine dependence: Secondary | ICD-10-CM | POA: Diagnosis not present

## 2015-03-28 DIAGNOSIS — I872 Venous insufficiency (chronic) (peripheral): Secondary | ICD-10-CM

## 2015-03-28 DIAGNOSIS — E785 Hyperlipidemia, unspecified: Secondary | ICD-10-CM | POA: Diagnosis not present

## 2015-03-28 DIAGNOSIS — L97929 Non-pressure chronic ulcer of unspecified part of left lower leg with unspecified severity: Secondary | ICD-10-CM

## 2015-03-28 DIAGNOSIS — E10319 Type 1 diabetes mellitus with unspecified diabetic retinopathy without macular edema: Secondary | ICD-10-CM | POA: Insufficient documentation

## 2015-03-28 DIAGNOSIS — R609 Edema, unspecified: Secondary | ICD-10-CM | POA: Diagnosis present

## 2015-03-28 DIAGNOSIS — I83029 Varicose veins of left lower extremity with ulcer of unspecified site: Secondary | ICD-10-CM | POA: Diagnosis not present

## 2015-03-28 NOTE — Progress Notes (Signed)
Referred by:  Renato Shin, MD [address] [place], [place] [address]  Reason for referral: left leg varicose veins   History of Present Illness  Donald Lopez is a 60 y.o. (September 16, 1955) male who presents with chief complaint: varicose veins.  Patient notes, onset of swelling years, associated with no trigger.  The patient's symptoms include: mild fullness in left leg as the day goes on.  The patient has had no history of DVT, known history of varicose vein, no history of venous stasis ulcers, no history of  Lymphedema and no history of skin changes in lower legs.  There is known family history of venous disorders.  The patient has never used compression stockings in the past.   Past Medical History  Diagnosis Date  . DIABETIC  RETINOPATHY 11/17/2006  . 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 POLYP, GALLBLADDER 11/17/2006  . TOBACCO USE, QUIT 11/17/2006  . VARICOSE VEINS, LOWER EXTREMITIES 11/17/2006  . HYPERLIPIDEMIA 11/17/2006  . DIABETES MELLITUS, TYPE I 11/17/2006  . Cancer [facility])     prostate cancer    Past Surgical History  Procedure Laterality Date  . Vasectomy    . Vitrectomy  2009    Right  . Knee arthroscopy      Left  . Shoulder surgery      Left  . Prostate surgery  October 2016    [facility] [place]/ Dr. Georgetta Haber     Social History   Social History  . Marital Status: Married    Spouse Name: Presenter, broadcasting  . Number of Children: 3  . Years of Education: N/A   Occupational History  . Airline pilot    Social History Main Topics  . Smoking status: Never Smoker   . Smokeless tobacco: Not on file  . Alcohol Use: Yes     Comment: 5-6/week  . Drug Use: No  . Sexual Activity: Not on file   Other Topics Concern  . Not on file   Social History Narrative    Family History  Problem Relation Age of Onset  . Cancer Father   . Hypertension Mother   . Varicose Veins Maternal Aunt   . Varicose Veins Maternal Grandmother     Current  Outpatient Prescriptions  Medication Sig Dispense Refill  . ACCU-CHEK SOFTCLIX LANCETS lancets Use to check blood sugar 5 times per day. Dx Code: E10.8 100 each 12  . aspirin 325 MG tablet Take 325 mg by mouth daily.      . Blood Glucose Monitoring Suppl (ACCU-CHEK AVIVA PLUS) W/DEVICE KIT Use to check blood sugar 5 times per day. Dx code: E10.8 1 kit 2  . glucose blood (ACCU-CHEK AVIVA PLUS) test strip Use to check blood sugar 5 times per day. Dx Code E10.8 450 each 4  . insulin glargine (LANTUS) 100 UNIT/ML injection Inject 27 Units into the skin every morning.     . insulin lispro (HUMALOG) 100 UNIT/ML injection Inject 0.02 mLs (2 Units total) into the skin 3 (three) times daily before meals. 10 mL 11  . Insulin Syringe-Needle U-100 (B-D INS SYR ULTRAFINE .3CC/30G) 30G X 1/2" 0.3 ML MISC Use as directed Four times a day Dx code: E10.8 400 each 2  . sildenafil (REVATIO) 20 MG tablet Take 1 tablet (20 mg total) by mouth 3 (three) times daily. 50 tablet 11  . pravastatin (PRAVACHOL) 80 MG tablet Take 1 tablet at bedtime (Patient not taking: Reported on 03/28/2015) 90 tablet 1   No  current facility-administered medications for this visit.    Allergies  Allergen Reactions  . Penicillins     REACTION: unspecified reaction     REVIEW OF SYSTEMS:  (Positives checked otherwise negative)  CARDIOVASCULAR:   '[ ]'$  chest pain,  '[ ]'$  chest pressure,  '[ ]'$  palpitations,  '[ ]'$  shortness of breath when laying flat,  '[ ]'$  shortness of breath with exertion,   '[ ]'$  pain in feet when walking,  '[ ]'$  pain in feet when laying flat, '[ ]'$  history of blood clot in veins (DVT),  '[ ]'$  history of phlebitis,  '[x]'$  swelling in legs,  '[ ]'$  varicose veins  PULMONARY:   '[ ]'$  productive cough,  '[ ]'$  asthma,  '[ ]'$  wheezing  NEUROLOGIC:   '[ ]'$  weakness in arms or legs,  '[ ]'$  numbness in arms or legs,  '[ ]'$  difficulty speaking or slurred speech,  '[ ]'$  temporary loss of vision in one eye,  '[ ]'$  dizziness  HEMATOLOGIC:   '[ ]'$   bleeding problems,  '[ ]'$  problems with blood clotting too easily  MUSCULOSKEL:   '[ ]'$  joint pain, '[ ]'$  joint swelling  GASTROINTEST:   '[ ]'$  vomiting blood,  '[ ]'$  blood in stool     GENITOURINARY:   '[ ]'$  burning with urination,  '[ ]'$  blood in urine  PSYCHIATRIC:   '[ ]'$  history of major depression  INTEGUMENTARY:   '[ ]'$  rashes,  '[ ]'$  ulcers  CONSTITUTIONAL:   '[ ]'$  fever,  '[ ]'$  chills   Physical Examination  Filed Vitals:   03/28/15 1054 03/28/15 1057  BP: 156/81 161/84  Pulse: 97   Temp: 97 F (36.1 C)   TempSrc: Oral   Resp: 18   Height: 5' 7.25" (1.708 m)   Weight: 198 lb 14.4 oz (90.22 kg)   SpO2: 97%    Body mass index is 30.93 kg/(m^2).  General: A&O x 3, WD, WN  Head: [place]/AT  Ear/Nose/Throat: Hearing grossly intact, nares without erythema or drainage, oropharynx without Erythema/Exudate, Mallampati score: 3  Eyes: PERRLA, EOMI  Neck: Supple, no nuchal rigidity, no palpable LAD  Pulmonary: Sym exp, good air movt, CTAB, no rales, rhonchi, & wheezing  Cardiac: RRR, Nl S1, S2, no Murmurs, rubs or gallops  Vascular: Vessel Right Left  Radial Palpable Palpable  Brachial Palpable Palpable  Carotid Palpable, without bruit Palpable, without bruit  Aorta Not palpable N/A  Femoral Palpable Palpable  Popliteal Not palpable Not palpable  PT Palpable Palpable  DP Palpable Palpable   Gastrointestinal: soft, NTND, no G/R, no HSM, no masses, no CVAT B  Musculoskeletal: M/S 5/5 throughout , Extremities without ischemic changes , +LLE mild LDS, no edema, obvious varicose veins in left leg with spider veins in foot  Neurologic: CN 2-12 intact , Pain and light touch intact in extremities , Motor exam as listed above  Psychiatric: Judgment intact, Mood & affect appropriate for pt's clinical situation  Dermatologic: See M/S exam for extremity exam, no rashes otherwise noted  Lymph : No Cervical, Axillary, or Inguinal lymphadenopathy    Non-Invasive Vascular  Imaging  LLE Venous Insufficiency Duplex (Date: 03/28/2015):   LLE:  no DVT and SVT,   + GSV reflux,: 4.9-10.6 mm  no SSV reflux,  + deep venous reflux: CFV, SFJ   Outside Studies/Documentation 3 pages of outside documents were reviewed including: outside PCP chart.   Medical Decision Making  Donald Lopez is a 60 y.o. male who presents with: LLE  chronic venous insufficiency (C4), varicose veins with complications   Based on the patient's history and examination, I recommend: compressive therapy.  I discussed with the patient the use of her 20-30 mm thigh high compression stockings.  The patient would be a candidate for EVLA L GSV, but given how limited his sx, I doubt he would get much benefit from it at this point.  He can follow up with the [facility] as needed.  Thank you for allowing [place] to participate in this patient's care.   Adele Barthel, MD [facility] Office: [phone] Pager: [phone]  03/28/2015, 12:22 PM

## 2015-04-11 ENCOUNTER — Ambulatory Visit: Payer: 59 | Admitting: Endocrinology

## 2015-04-17 ENCOUNTER — Telehealth: Payer: Self-pay

## 2015-04-17 NOTE — Telephone Encounter (Signed)
Spoke with patient and he states that he has not had one, normally does he has a appt in April and will ask about it at time of visit

## 2015-05-05 ENCOUNTER — Encounter: Payer: Self-pay | Admitting: Endocrinology

## 2015-05-05 ENCOUNTER — Ambulatory Visit (INDEPENDENT_AMBULATORY_CARE_PROVIDER_SITE_OTHER): Payer: 59 | Admitting: Endocrinology

## 2015-05-05 VITALS — BP 142/80 | HR 88 | Temp 97.9°F | Ht 67.25 in | Wt 194.0 lb

## 2015-05-05 DIAGNOSIS — E108 Type 1 diabetes mellitus with unspecified complications: Secondary | ICD-10-CM

## 2015-05-05 DIAGNOSIS — Z0000 Encounter for general adult medical examination without abnormal findings: Secondary | ICD-10-CM | POA: Diagnosis not present

## 2015-05-05 LAB — POCT GLYCOSYLATED HEMOGLOBIN (HGB A1C): HEMOGLOBIN A1C: 8.5

## 2015-05-05 NOTE — Progress Notes (Signed)
Subjective:    Patient ID: Donald Lopez, male    DOB: 1955-12-06, 60 y.o.   MRN: [id number]  HPI Pt returns for f/u of diabetes mellitus: DM type: 1 Dx'ed: 6295 Complications: CAD and proliferative retinopathy Therapy: insulin since soon after dx.  DKA: never Severe hypoglycemia: never Pancreatitis: never.   Other: he has declined pump and continuous glucose monitor.   Interval history: He has mild hypoglycemia at lunch, or with exercise.  He says cbg's are highest in am (higher than at hs).  He takes lantus, 24-25 units qd.  Past Medical History  Diagnosis Date  . DIABETIC  RETINOPATHY 11/17/2006  . 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 POLYP, GALLBLADDER 11/17/2006  . TOBACCO USE, QUIT 11/17/2006  . VARICOSE VEINS, LOWER EXTREMITIES 11/17/2006  . HYPERLIPIDEMIA 11/17/2006  . DIABETES MELLITUS, TYPE I 11/17/2006  . Cancer [facility])     prostate cancer    Past Surgical History  Procedure Laterality Date  . Vasectomy    . Vitrectomy  2009    Right  . Knee arthroscopy      Left  . Shoulder surgery      Left  . Prostate surgery  October 2016    [facility] [place]/ Dr. Georgetta Haber     Social History   Social History  . Marital Status: Married    Spouse Name: Presenter, broadcasting  . Number of Children: 3  . Years of Education: N/A   Occupational History  . Airline pilot    Social History Main Topics  . Smoking status: Never Smoker   . Smokeless tobacco: Not on file  . Alcohol Use: Yes     Comment: 5-6/week  . Drug Use: No  . Sexual Activity: Not on file   Other Topics Concern  . Not on file   Social History Narrative    Current Outpatient Prescriptions on File Prior to Visit  Medication Sig Dispense Refill  . ACCU-CHEK SOFTCLIX LANCETS lancets Use to check blood sugar 5 times per day. Dx Code: E10.8 100 each 12  . aspirin 325 MG tablet Take 325 mg by mouth daily.      . Blood Glucose Monitoring Suppl (ACCU-CHEK AVIVA PLUS) W/DEVICE KIT Use to check blood sugar 5  times per day. Dx code: E10.8 1 kit 2  . glucose blood (ACCU-CHEK AVIVA PLUS) test strip Use to check blood sugar 5 times per day. Dx Code E10.8 450 each 4  . insulin lispro (HUMALOG) 100 UNIT/ML injection Inject 0.02 mLs (2 Units total) into the skin 3 (three) times daily before meals. 10 mL 11  . Insulin Syringe-Needle U-100 (B-D INS SYR ULTRAFINE .3CC/30G) 30G X 1/2" 0.3 ML MISC Use as directed Four times a day Dx code: E10.8 400 each 2  . pravastatin (PRAVACHOL) 80 MG tablet Take 1 tablet at bedtime (Patient not taking: Reported on 05/05/2015) 90 tablet 1   No current facility-administered medications on file prior to visit.    Allergies  Allergen Reactions  . Penicillins     REACTION: unspecified reaction    Family History  Problem Relation Age of Onset  . Cancer Father   . Hypertension Mother   . Varicose Veins Maternal Aunt   . Varicose Veins Maternal Grandmother     BP 142/80 mmHg  Pulse 88  Temp(Src) 97.9 F (36.6 C) (Oral)  Ht 5' 7.25" (1.708 m)  Wt 194 lb (87.998 kg)  BMI 30.16 kg/m2  SpO2 96%  Review of Systems Denies chest pain and  LOC.      Objective:   Physical Exam VITAL SIGNS:  See vs page GENERAL: no distress Pulses: dorsalis pedis intact bilat.   MSK: no deformity of the feet CV: no leg edema on the right, and 1+ on the left. There are varicosities at the left ankle and foot.   Skin:  no ulcer on the feet.  normal color and temp on the feet.  Neuro: sensation is intact to touch on the feet.    A1c=8.5%     Assessment & Plan:  DM: he needs increased rx CAD: he declines repeat treadmill for now.    Patient is advised the following: Patient Instructions  Please come back for a regular physical appointment in 3 months.  Please increase the lantus to 27 units each morning (but just 22 if you are going to be active).   Take humalog 2 units 3 times a day (just before each meal).  You can take 1-2 extra units for a large meal or cbg over 200.   check  your blood sugar 5 times a day: before the 3 meals, and at bedtime.  also check if you have symptoms of your blood sugar being too high or too low.  please keep a record of the readings and bring it to your next appointment here.  You can write it on any piece of paper.  please call us sooner if your blood sugar goes below 70, or if you have a lot of readings over 200.   On this type of insulin schedule, you should eat meals on a regular schedule.  If a meal is missed or significantly delayed, your blood sugar could go low.

## 2015-05-05 NOTE — Patient Instructions (Signed)
Please come back for a regular physical appointment in 3 months.  Please increase the lantus to 27 units each morning (but just 22 if you are going to be active).   Take humalog 2 units 3 times a day (just before each meal).  You can take 1-2 extra units for a large meal or cbg over 200.   check your blood sugar 5 times a day: before the 3 meals, and at bedtime.  also check if you have symptoms of your blood sugar being too high or too low.  please keep a record of the readings and bring it to your next appointment here.  You can write it on any piece of paper.  please call us sooner if your blood sugar goes below 70, or if you have a lot of readings over 200.   On this type of insulin schedule, you should eat meals on a regular schedule.  If a meal is missed or significantly delayed, your blood sugar could go low.

## 2015-08-04 ENCOUNTER — Encounter: Payer: 59 | Admitting: Endocrinology

## 2015-08-12 ENCOUNTER — Telehealth: Payer: Self-pay | Admitting: Endocrinology

## 2015-08-12 ENCOUNTER — Other Ambulatory Visit: Payer: Self-pay

## 2015-08-12 MED ORDER — BASAGLAR KWIKPEN 100 UNIT/ML ~~LOC~~ SOPN: 27.0000 [IU] | PEN_INJECTOR | SUBCUTANEOUS | 0 refills | Status: DC

## 2015-08-12 NOTE — Telephone Encounter (Signed)
PT requests that Basaglar be submitted to [facility]

## 2015-08-12 NOTE — Telephone Encounter (Signed)
Rx sent per patient request  

## 2015-08-14 ENCOUNTER — Telehealth: Payer: Self-pay | Admitting: Endocrinology

## 2015-08-14 NOTE — Telephone Encounter (Signed)
PT was told the [facility] is requiring a PA, he stated he would like to try and get this approved

## 2015-08-18 NOTE — Telephone Encounter (Signed)
I contacted the pt and requested a call back from the pt to discuss.

## 2015-08-20 MED ORDER — INSULIN GLARGINE 100 UNIT/ML SOLOSTAR PEN
27.0000 [IU] | PEN_INJECTOR | Freq: Every morning | SUBCUTANEOUS | 5 refills | Status: DC
Start: 2015-08-20 — End: 2015-08-21

## 2015-08-20 NOTE — Telephone Encounter (Signed)
I contacted the pt and advised Donald Lopez is the not preferred medication under his insurance formulary. Pt was advised we could try to get the [place] approved, but Lantus is the covered alternative. Pt voiced understanding and will continue lantus at this time.

## 2015-08-21 ENCOUNTER — Other Ambulatory Visit: Payer: Self-pay

## 2015-08-21 MED ORDER — INSULIN GLARGINE 100 UNIT/ML ~~LOC~~ SOLN
27.0000 [IU] | Freq: Every morning | SUBCUTANEOUS | 2 refills | Status: DC
Start: 2015-08-21 — End: 2015-10-06

## 2015-09-04 DIAGNOSIS — E103593 Type 1 diabetes mellitus with proliferative diabetic retinopathy without macular edema, bilateral: Secondary | ICD-10-CM | POA: Insufficient documentation

## 2015-09-05 ENCOUNTER — Ambulatory Visit (INDEPENDENT_AMBULATORY_CARE_PROVIDER_SITE_OTHER): Payer: 59 | Admitting: Endocrinology

## 2015-09-05 ENCOUNTER — Encounter: Payer: Self-pay | Admitting: Endocrinology

## 2015-09-05 VITALS — BP 128/62 | HR 99 | Ht 67.0 in | Wt 198.0 lb

## 2015-09-05 DIAGNOSIS — Z0000 Encounter for general adult medical examination without abnormal findings: Secondary | ICD-10-CM | POA: Diagnosis not present

## 2015-09-05 DIAGNOSIS — E108 Type 1 diabetes mellitus with unspecified complications: Secondary | ICD-10-CM

## 2015-09-05 LAB — CBC WITH DIFFERENTIAL/PLATELET
BASOS ABS: 0 10*3/uL (ref 0.0–0.1)
Basophils Relative: 0.4 % (ref 0.0–3.0)
Eosinophils Absolute: 0.2 10*3/uL (ref 0.0–0.7)
Eosinophils Relative: 2.6 % (ref 0.0–5.0)
HEMATOCRIT: 43 % (ref 39.0–52.0)
HEMOGLOBIN: 15.1 g/dL (ref 13.0–17.0)
LYMPHS PCT: 25.1 % (ref 12.0–46.0)
Lymphs Abs: 2 10*3/uL (ref 0.7–4.0)
MCHC: 35 g/dL (ref 30.0–36.0)
MCV: 91.2 fl (ref 78.0–100.0)
MONOS PCT: 8 % (ref 3.0–12.0)
Monocytes Absolute: 0.6 10*3/uL (ref 0.1–1.0)
NEUTROS ABS: 5.1 10*3/uL (ref 1.4–7.7)
Neutrophils Relative %: 63.9 % (ref 43.0–77.0)
PLATELETS: 273 10*3/uL (ref 150.0–400.0)
RBC: 4.71 Mil/uL (ref 4.22–5.81)
RDW: 13.7 % (ref 11.5–15.5)
WBC: 7.9 10*3/uL (ref 4.0–10.5)

## 2015-09-05 LAB — BASIC METABOLIC PANEL
BUN: 12 mg/dL (ref 6–23)
CHLORIDE: 105 meq/L (ref 96–112)
CO2: 29 meq/L (ref 19–32)
Calcium: 9.3 mg/dL (ref 8.4–10.5)
Creatinine, Ser: 1.01 mg/dL (ref 0.40–1.50)
GFR: 80.68 mL/min (ref >60.00)
Glucose, Bld: 163 mg/dL — ABNORMAL HIGH (ref 70–99)
Potassium: 3.7 mEq/L (ref 3.5–5.1)
SODIUM: 140 meq/L (ref 135–145)

## 2015-09-05 LAB — URINALYSIS, ROUTINE W REFLEX MICROSCOPIC
BILIRUBIN URINE: NEGATIVE
HGB URINE DIPSTICK: NEGATIVE
KETONES UR: NEGATIVE
LEUKOCYTES UA: NEGATIVE
NITRITE: NEGATIVE
RBC / HPF: NONE SEEN (ref 0–?)
Specific Gravity, Urine: 1.01 (ref 1.000–1.030)
Total Protein, Urine: NEGATIVE
UROBILINOGEN UA: 1 (ref 0.0–1.0)
Urine Glucose: 100 — AB
pH: 6.5 (ref 5.0–8.0)

## 2015-09-05 LAB — HEPATIC FUNCTION PANEL
ALT: 13 U/L (ref 0–53)
AST: 13 U/L (ref 0–37)
Albumin: 3.9 g/dL (ref 3.5–5.2)
Alkaline Phosphatase: 62 U/L (ref 39–117)
BILIRUBIN DIRECT: 0.1 mg/dL (ref 0.0–0.3)
BILIRUBIN TOTAL: 0.5 mg/dL (ref 0.2–1.2)
TOTAL PROTEIN: 6.6 g/dL (ref 6.0–8.3)

## 2015-09-05 LAB — TSH: TSH: 1.25 u[IU]/mL (ref 0.35–4.50)

## 2015-09-05 LAB — LIPID PANEL
CHOLESTEROL: 192 mg/dL (ref 0–200)
HDL: 65.6 mg/dL (ref >39.00)
LDL Cholesterol: 106 mg/dL — ABNORMAL HIGH (ref 0–99)
NonHDL: 126.04
TRIGLYCERIDES: 101 mg/dL (ref 0.0–149.0)
Total CHOL/HDL Ratio: 3
VLDL: 20.2 mg/dL (ref 0.0–40.0)

## 2015-09-05 LAB — MICROALBUMIN / CREATININE URINE RATIO
CREATININE, U: 217.1 mg/dL
MICROALB/CREAT RATIO: 0.6 mg/g (ref 0.0–30.0)
Microalb, Ur: 1.2 mg/dL (ref 0.0–1.9)

## 2015-09-05 LAB — POCT GLYCOSYLATED HEMOGLOBIN (HGB A1C): HEMOGLOBIN A1C: 8.6

## 2015-09-05 NOTE — Patient Instructions (Addendum)
Please come back for a follow-up appointment in 3 months  Please increase the lantus to 28 units daily (but just 20 if you are going to be active).   Try to eat more of your total daily intake earlier in the day.   Take humalog 2 units 3 times a day (just before each meal).  You can take 1-2 extra units for a large meal or cbg over 200.   check your blood sugar 5 times a day: before the 3 meals, and at bedtime.  also check if you have symptoms of your blood sugar being too high or too low.  please keep a record of the readings and bring it to your next appointment here.  You can write it on any piece of paper.  please call us sooner if your blood sugar goes below 70, or if you have a lot of readings over 200.   On this type of insulin schedule, you should eat meals on a regular schedule.  If a meal is missed or significantly delayed, your blood sugar could go low.   Please consider these measures for your health:  minimize alcohol.  Do not use tobacco products.  Have a colonoscopy at least every 10 years from age 58.  Keep firearms safely stored.  Always use seat belts.  have working smoke alarms in your home.  See an eye doctor and dentist regularly.  Never drive under the influence of alcohol or drugs (including prescription drugs).  Those with fair skin should take precautions against the sun, and should carefully examine their skin once per month, for any new or changed moles.

## 2015-09-05 NOTE — Progress Notes (Signed)
Subjective:    Patient ID: Donald Lopez, male    DOB: 1955-03-13, 60 y.o.   MRN: [id number]  HPI Pt is here for regular wellness examination, and is feeling pretty well in general, and says chronic med probs are stable, except as noted below Past Medical History:  Diagnosis Date  . Cancer [facility])    prostate cancer  . DIABETES MELLITUS, TYPE I 11/17/2006  . DIABETIC  RETINOPATHY 11/17/2006  . 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 HYPERLIPIDEMIA 11/17/2006  . POLYP, GALLBLADDER 11/17/2006  . TOBACCO USE, QUIT 11/17/2006  . VARICOSE VEINS, LOWER EXTREMITIES 11/17/2006    Past Surgical History:  Procedure Laterality Date  . KNEE ARTHROSCOPY     Left  . PROSTATE SURGERY  October 2016   [facility]/ Dr. Georgetta Haber   . SHOULDER SURGERY     Left  . VASECTOMY    . VITRECTOMY  2009   Right    Social History   Social History  . Marital status: Married    Spouse name: Presenter, broadcasting  . Number of children: 3  . Years of education: N/A   Occupational History  . Airline pilot    Social History Main Topics  . Smoking status: Never Smoker  . Smokeless tobacco: Not on file  . Alcohol use Yes     Comment: 5-6/week  . Drug use: No  . Sexual activity: Not on file   Other Topics Concern  . Not on file   Social History Narrative  . No narrative on file    Current Outpatient Prescriptions on File Prior to Visit  Medication Sig Dispense Refill  . ACCU-CHEK SOFTCLIX LANCETS lancets Use to check blood sugar 5 times per day. Dx Code: E10.8 100 each 12  . aspirin 325 MG tablet Take 325 mg by mouth daily.      . Blood Glucose Monitoring Suppl (ACCU-CHEK AVIVA PLUS) W/DEVICE KIT Use to check blood sugar 5 times per day. Dx code: E10.8 1 kit 2  . glucose blood (ACCU-CHEK AVIVA PLUS) test strip Use to check blood sugar 5 times per day. Dx Code E10.8 450 each 4  . insulin glargine (LANTUS) 100 UNIT/ML injection Inject 0.27 mLs (27 Units total) into the skin every morning. 10 mL 2    . insulin lispro (HUMALOG) 100 UNIT/ML injection Inject 0.02 mLs (2 Units total) into the skin 3 (three) times daily before meals. (Patient taking differently: Inject 5 Units into the skin 3 (three) times daily before meals. ) 10 mL 11  . Insulin Syringe-Needle U-100 (B-D INS SYR ULTRAFINE .3CC/30G) 30G X 1/2" 0.3 ML MISC Use as directed Four times a day Dx code: E10.8 400 each 2  . pravastatin (PRAVACHOL) 80 MG tablet Take 1 tablet at bedtime 90 tablet 1  . sildenafil (REVATIO) 20 MG tablet 2-5 pills as needed     No current facility-administered medications on file prior to visit.     Allergies  Allergen Reactions  . Penicillins     REACTION: unspecified reaction    Family History  Problem Relation Age of Onset  . Cancer Father   . Hypertension Mother   . Varicose Veins Maternal Aunt   . Varicose Veins Maternal Grandmother     BP 128/62   Pulse 99   Ht '5\' 7"'$  (1.702 m)   Wt 198 lb (89.8 kg)   SpO2 96%   BMI 31.01 kg/m    Review of Systems  Constitutional: Negative for fever.  HENT: Negative  for hearing loss.   Eyes: Negative for visual disturbance.  Respiratory: Negative for shortness of breath.   Cardiovascular: Negative for chest pain.  Gastrointestinal: Negative for anal bleeding.  Endocrine: Negative for cold intolerance.  Genitourinary: Negative for hematuria.  Musculoskeletal: Negative for back pain.  Skin: Negative for rash.  Allergic/Immunologic: Negative for environmental allergies.  Hematological: Does not bruise/bleed easily.  Psychiatric/Behavioral:       Mild insomnia       Objective:   Physical Exam VS: see vs page GEN: no distress HEAD: head: no deformity eyes: no periorbital swelling, no proptosis external nose and ears are normal mouth: no lesion seen NECK: supple, thyroid is not enlarged CHEST WALL: no deformity LUNGS: clear to auscultation BREASTS:  No gynecomastia CV: reg rate and rhythm, no murmur ABD: abdomen is soft, nontender.   no hepatosplenomegaly.  not distended.  no hernia GENITALIA/RECTAL/PROSTATE: sees urology MUSCULOSKELETAL: muscle bulk and strength are grossly normal.  no obvious joint swelling.  gait is normal and steady PULSES: no carotid bruit NEURO:  cn 2-12 grossly intact.   readily moves all 4's.  SKIN:  Normal texture and temperature.  No rash or suspicious lesion is visible.   NODES:  None palpable at the neck PSYCH: alert, well-oriented.  Does not appear anxious nor depressed.   A1c=8.5%    Assessment & Plan:  Wellness visit today, with problems stable, except as noted.   SEPARATE EVALUATION FOLLOWS--EACH PROBLEM HERE IS NEW, NOT RESPONDING TO TREATMENT, OR POSES SIGNIFICANT RISK TO THE PATIENT'S HEALTH: HISTORY OF THE PRESENT ILLNESS: Pt returns for f/u of diabetes mellitus: DM type: 1 Dx'ed: 1610 Complications: CAD and proliferative retinopathy Therapy: insulin since soon after dx.  DKA: never Severe hypoglycemia: never Pancreatitis: never.   Other: he has declined pump and continuous glucose monitor.   Interval history: no cbg record, but states he has mild hypoglycemia 3-4 times per week.  This usually happens at lunch, or with exercise.  pt states he feels well in general. PAST MEDICAL HISTORY reviewed and up to date today REVIEW OF SYSTEMS: Denies LOC PHYSICAL EXAMINATION: VITAL SIGNS:  See vs page GENERAL: no distress Pulses: dorsalis pedis intact bilat.   MSK: no deformity of the feet CV: no leg edema on the right, and 1+ on the left. There are varicosities at the left ankle and foot.   Skin:  no ulcer on the feet.  normal color and temp on the feet.  Neuro: sensation is intact to touch on the feet.   LAB/XRAY RESULTS: Lab Results  Component Value Date   HGBA1C 8.6 09/05/2015  IMPRESSION: Type 1 DM: he needs increased rx

## 2015-09-06 LAB — HEPATITIS C ANTIBODY: HCV AB: NEGATIVE

## 2015-09-06 LAB — HIV ANTIBODY (ROUTINE TESTING W REFLEX): HIV 1&2 Ab, 4th Generation: NONREACTIVE

## 2015-10-06 ENCOUNTER — Other Ambulatory Visit: Payer: Self-pay | Admitting: Endocrinology

## 2015-10-06 ENCOUNTER — Telehealth: Payer: Self-pay | Admitting: Endocrinology

## 2015-10-06 MED ORDER — INSULIN LISPRO 100 UNIT/ML ~~LOC~~ SOLN: 5.0000 [IU] | Freq: Three times a day (TID) | SUBCUTANEOUS | 2 refills | Status: DC

## 2015-10-06 NOTE — Telephone Encounter (Signed)
Pt needs novolog called in 90 day supply to [place]

## 2015-10-06 NOTE — Telephone Encounter (Signed)
Refill submitted. 

## 2015-10-09 ENCOUNTER — Telehealth: Payer: Self-pay | Admitting: Endocrinology

## 2015-10-09 NOTE — Telephone Encounter (Signed)
Patient stated that pharmacy did not received the new prescription for [place], [place]  (Phone) [phone]  [phone]

## 2015-10-10 MED ORDER — INSULIN ASPART 100 UNIT/ML ~~LOC~~ SOLN: 5.0000 [IU] | Freq: Three times a day (TID) | SUBCUTANEOUS | 2 refills | Status: DC

## 2015-10-10 NOTE — Telephone Encounter (Signed)
Refill for novolog submitted to [place].

## 2015-10-13 ENCOUNTER — Other Ambulatory Visit: Payer: Self-pay

## 2015-10-13 MED ORDER — INSULIN ASPART 100 UNIT/ML ~~LOC~~ SOLN: 5.0000 [IU] | Freq: Three times a day (TID) | SUBCUTANEOUS | 2 refills | Status: DC

## 2015-10-16 NOTE — Telephone Encounter (Signed)
Donald Lopez is still stating they did not receive anything from [place] regarding the novolog rx can we fax it to them F# [phone]

## 2015-12-05 ENCOUNTER — Ambulatory Visit: Payer: 59 | Admitting: Endocrinology

## 2016-02-06 ENCOUNTER — Other Ambulatory Visit: Payer: Self-pay | Admitting: Endocrinology

## 2016-02-06 ENCOUNTER — Telehealth: Payer: Self-pay | Admitting: Endocrinology

## 2016-02-06 NOTE — Telephone Encounter (Signed)
Patient ask you to send novalog to  [place], [place] (Phone) [phone] (Fax)

## 2016-02-09 MED ORDER — INSULIN ASPART 100 UNIT/ML ~~LOC~~ SOLN: 5.0000 [IU] | Freq: Three times a day (TID) | SUBCUTANEOUS | 2 refills | Status: DC

## 2016-02-09 NOTE — Telephone Encounter (Signed)
Refill submitted. (message viewed on 02/09/2016)

## 2016-02-23 ENCOUNTER — Ambulatory Visit (INDEPENDENT_AMBULATORY_CARE_PROVIDER_SITE_OTHER): Payer: 59 | Admitting: Endocrinology

## 2016-02-23 ENCOUNTER — Encounter: Payer: Self-pay | Admitting: Endocrinology

## 2016-02-23 VITALS — BP 122/84 | HR 106 | Ht 67.0 in | Wt 200.0 lb

## 2016-02-23 DIAGNOSIS — E108 Type 1 diabetes mellitus with unspecified complications: Secondary | ICD-10-CM | POA: Diagnosis not present

## 2016-02-23 LAB — POCT GLYCOSYLATED HEMOGLOBIN (HGB A1C): Hemoglobin A1C: 8.8

## 2016-02-23 MED ORDER — INSULIN GLARGINE 100 UNIT/ML ~~LOC~~ SOLN: 32.0000 [IU] | Freq: Every day | SUBCUTANEOUS | 11 refills | Status: DC

## 2016-02-23 MED ORDER — INSULIN ASPART 100 UNIT/ML ~~LOC~~ SOLN: 3.0000 [IU] | Freq: Three times a day (TID) | SUBCUTANEOUS | 2 refills | Status: DC

## 2016-02-23 NOTE — Progress Notes (Signed)
Subjective:    Patient ID: Donald Lopez, male    DOB: September 04, 1955, 61 y.o.   MRN: [id number]  HPI Pt returns for f/u of diabetes mellitus: DM type: 1 Dx'ed: 2637 Complications: CAD and proliferative retinopathy Therapy: insulin since soon after dx.  DKA: never Severe hypoglycemia: never.   Pancreatitis: never.   Other: he has declined pump and continuous glucose monitor.   Interval history: no cbg record, but states he has mild hypoglycemia 2-3 times per week.  This still usually happens at lunch, or with exercise.  pt states he feels well in general.  He takes lantus approx 20 units qam and 7 units qhs. It is still highest in the afternoon.  He takes humalog qac appprox 4-5-5 units.   Past Medical History:  Diagnosis Date  . Cancer [facility])    prostate cancer  . DIABETES MELLITUS, TYPE I 11/17/2006  . DIABETIC  RETINOPATHY 11/17/2006  . 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 HYPERLIPIDEMIA 11/17/2006  . POLYP, GALLBLADDER 11/17/2006  . TOBACCO USE, QUIT 11/17/2006  . VARICOSE VEINS, LOWER EXTREMITIES 11/17/2006    Past Surgical History:  Procedure Laterality Date  . KNEE ARTHROSCOPY     Left  . PROSTATE SURGERY  October 2016   [facility]/ Dr. Georgetta Haber   . SHOULDER SURGERY     Left  . VASECTOMY    . VITRECTOMY  2009   Right    Social History   Social History  . Marital status: Married    Spouse name: Presenter, broadcasting  . Number of children: 3  . Years of education: N/A   Occupational History  . Airline pilot    Social History Main Topics  . Smoking status: Never Smoker  . Smokeless tobacco: Never Used  . Alcohol use Yes     Comment: 5-6/week  . Drug use: No  . Sexual activity: Not on file   Other Topics Concern  . Not on file   Social History Narrative  . No narrative on file    Current Outpatient Prescriptions on File Prior to Visit  Medication Sig Dispense Refill  . ACCU-CHEK AVIVA PLUS test strip CHECK BLOOD SUGAR 5 TIMES PER DAY 500 each 3    . ACCU-CHEK SOFTCLIX LANCETS lancets Use to check blood sugar 5 times per day. Dx Code: E10.8 100 each 12  . aspirin 325 MG tablet Take 325 mg by mouth daily.      . Blood Glucose Monitoring Suppl (ACCU-CHEK AVIVA PLUS) W/DEVICE KIT Use to check blood sugar 5 times per day. Dx code: E10.8 1 kit 2  . Insulin Syringe-Needle U-100 (B-D INS SYR ULTRAFINE .3CC/30G) 30G X 1/2" 0.3 ML MISC Use as directed Four times a day Dx code: E10.8 400 each 2  . pravastatin (PRAVACHOL) 80 MG tablet Take 1 tablet at bedtime 90 tablet 1  . sildenafil (REVATIO) 20 MG tablet 2-5 pills as needed     No current facility-administered medications on file prior to visit.     Allergies  Allergen Reactions  . Penicillins     REACTION: unspecified reaction    Family History  Problem Relation Age of Onset  . Cancer Father   . Hypertension Mother   . Varicose Veins Maternal Aunt   . Varicose Veins Maternal Grandmother     BP 122/84   Pulse (!) 106   Ht '5\' 7"'$  (1.702 m)   Wt 200 lb (90.7 kg)   SpO2 95%   BMI 31.32 kg/m  Review of Systems Denies LOC.      Objective:   Physical Exam VITAL SIGNS:  See vs page GENERAL: no distress Pulses: dorsalis pedis intact bilat.   MSK: no deformity of the feet CV: trace bilat leg edema, and bilat vv's.   Skin:  no ulcer on the feet.  normal color and temp on the feet. Neuro: sensation is intact to touch on the feet  A1c=8.8%    Assessment & Plan:  type 1 DM, with CAD: We'll try to improve glycemic control by emphasizing basal.  Patient is advised the following: Patient Instructions  Please come back for a follow-up appointment in 3 months  Please increase the lantus to 32 units daily (but just 20 if you are going to be active).   Take humalog approx 3 units 3 times a day (just before each meal).  You can take 1-2 extra units for a large meal or blood sugar. over 200.   check your blood sugar 5 times a day: before the 3 meals, and at bedtime.  also check if  you have symptoms of your blood sugar being too high or too low.  please keep a record of the readings and bring it to your next appointment here.  You can write it on any piece of paper.  please call us sooner if your blood sugar goes below 70, or if you have a lot of readings over 200.   On this type of insulin schedule, you should eat meals on a regular schedule.  If a meal is missed or significantly delayed, your blood sugar could go low.   Here is a brochure about the "V-GO."  Let us know if you want to try this

## 2016-02-23 NOTE — Patient Instructions (Addendum)
Please come back for a follow-up appointment in 3 months  Please increase the lantus to 32 units daily (but just 20 if you are going to be active).   Take humalog approx 3 units 3 times a day (just before each meal).  You can take 1-2 extra units for a large meal or blood sugar. over 200.   check your blood sugar 5 times a day: before the 3 meals, and at bedtime.  also check if you have symptoms of your blood sugar being too high or too low.  please keep a record of the readings and bring it to your next appointment here.  You can write it on any piece of paper.  please call us sooner if your blood sugar goes below 70, or if you have a lot of readings over 200.   On this type of insulin schedule, you should eat meals on a regular schedule.  If a meal is missed or significantly delayed, your blood sugar could go low.   Here is a brochure about the "V-GO."  Let us know if you want to try this.

## 2016-02-24 ENCOUNTER — Other Ambulatory Visit: Payer: Self-pay

## 2016-02-24 DIAGNOSIS — E108 Type 1 diabetes mellitus with unspecified complications: Secondary | ICD-10-CM

## 2016-02-24 MED ORDER — "INSULIN SYRINGE-NEEDLE U-100 30G X 1/2"" 0.3 ML MISC": 2 refills | Status: DC

## 2016-04-08 ENCOUNTER — Other Ambulatory Visit: Payer: Self-pay | Admitting: Endocrinology

## 2016-05-03 ENCOUNTER — Other Ambulatory Visit: Payer: Self-pay | Admitting: Endocrinology

## 2016-05-03 ENCOUNTER — Telehealth: Payer: Self-pay | Admitting: Endocrinology

## 2016-05-03 NOTE — Telephone Encounter (Signed)
Refill of  insulin glargine (LANTUS) 100 UNIT/ML injection  [place], [place] [phone] (Phone) [phone] (Fax)

## 2016-05-03 NOTE — Telephone Encounter (Deleted)
Refill of  insulin glargine (LANTUS) 100 UNIT/ML injection  [place], [place] [phone] (Phone) [phone] (Fax)

## 2016-05-03 NOTE — Telephone Encounter (Signed)
Patient notified that rx was sent in  

## 2016-06-04 ENCOUNTER — Ambulatory Visit: Payer: 59 | Admitting: Endocrinology

## 2016-06-05 ENCOUNTER — Other Ambulatory Visit: Payer: Self-pay | Admitting: Endocrinology

## 2016-07-09 ENCOUNTER — Other Ambulatory Visit: Payer: Self-pay | Admitting: Endocrinology

## 2016-07-12 ENCOUNTER — Ambulatory Visit: Payer: 59 | Admitting: Endocrinology

## 2016-08-03 ENCOUNTER — Other Ambulatory Visit: Payer: Self-pay | Admitting: Endocrinology

## 2016-08-03 ENCOUNTER — Telehealth: Payer: Self-pay | Admitting: Endocrinology

## 2016-08-03 NOTE — Telephone Encounter (Signed)
**  Remind patient they can make refill requests via MyChart**  Medication refill request (Name & Dosage): LANTUS 100 UNIT/ML injection insulin aspart (NOVOLOG) 100 UNIT/ML injection  Preferred pharmacy (Name & Address):  [place], [place]. [phone] (Phone) [phone] (Fax)    Other comments (if applicable):   Patient is going out of town this weekend and is requesting medication for pick up thursday

## 2016-08-04 ENCOUNTER — Other Ambulatory Visit: Payer: Self-pay

## 2016-08-04 MED ORDER — INSULIN GLARGINE 100 UNIT/ML ~~LOC~~ SOLN: SUBCUTANEOUS | 3 refills | Status: DC

## 2016-08-04 MED ORDER — INSULIN ASPART 100 UNIT/ML ~~LOC~~ SOLN: SUBCUTANEOUS | 3 refills | Status: DC

## 2016-08-04 NOTE — Telephone Encounter (Signed)
This has been ordered 

## 2016-08-30 ENCOUNTER — Encounter: Payer: Self-pay | Admitting: Endocrinology

## 2016-08-30 ENCOUNTER — Ambulatory Visit (INDEPENDENT_AMBULATORY_CARE_PROVIDER_SITE_OTHER): Payer: 59 | Admitting: Endocrinology

## 2016-08-30 VITALS — BP 132/72 | HR 84 | Wt 198.0 lb

## 2016-08-30 DIAGNOSIS — I2510 Atherosclerotic heart disease of native coronary artery without angina pectoris: Secondary | ICD-10-CM

## 2016-08-30 DIAGNOSIS — E108 Type 1 diabetes mellitus with unspecified complications: Secondary | ICD-10-CM | POA: Diagnosis not present

## 2016-08-30 LAB — POCT GLYCOSYLATED HEMOGLOBIN (HGB A1C): Hemoglobin A1C: 8.3

## 2016-08-30 MED ORDER — INSULIN ASPART 100 UNIT/ML ~~LOC~~ SOLN: 3.0000 [IU] | Freq: Three times a day (TID) | SUBCUTANEOUS | 3 refills | Status: DC

## 2016-08-30 MED ORDER — INSULIN GLARGINE 100 UNIT/ML ~~LOC~~ SOLN: 34.0000 [IU] | SUBCUTANEOUS | 3 refills | Status: DC

## 2016-08-30 NOTE — Patient Instructions (Addendum)
Please come back for a regular physical appointment in 3 months  Please increase the lantus to 34 units daily (but please continue to reduce, if you are going to be active).  You can split if you want to.   Take humalog approx 3 units 3 times a day (just before each meal).  You can take 1-2 extra units for a large meal or blood sugar. over 200.   check your blood sugar 5 times a day: before the 3 meals, and at bedtime.  also check if you have symptoms of your blood sugar being too high or too low.  please keep a record of the readings and bring it to your next appointment here.  You can write it on any piece of paper.  please call us sooner if your blood sugar goes below 70, or if you have a lot of readings over 200.  On this type of insulin schedule, you should eat meals on a regular schedule.  If a meal is missed or significantly delayed, your blood sugar could go low.   Please redo the treadmill test.  you will receive a phone call, about a day and time for an appointment.

## 2016-08-30 NOTE — Progress Notes (Signed)
Subjective:    Patient ID: Donald Lopez, male    DOB: [birth year], 61 y.o.   MRN: [id number]  HPI Pt returns for f/u of diabetes mellitus: DM type: 1 Dx'ed: 6010 Complications: CAD and proliferative retinopathy Therapy: insulin since soon after dx.  DKA: never Severe hypoglycemia: never.   Pancreatitis: never.   Other: he has declined pump and continuous glucose monitor.   Interval history: no cbg record, but states he has mild hypoglycemia 2-3 times per week.  This still usually happens at lunch, or with exercise.  pt states he feels well in general.  He takes lantus approx 20 units qam (18 if he is going to be active), and 10 units qhs. It is still highest in the afternoon.  He takes novolog 3-5 units 3 times a day (just before each meal).  no cbg record, but states cbg's are mildly low approx twice per week.  This happens with activity.  Past Medical History:  Diagnosis Date  . Cancer [facility])    prostate cancer  . DIABETES MELLITUS, TYPE I 11/17/2006  . DIABETIC  RETINOPATHY 11/17/2006  . 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 HYPERLIPIDEMIA 11/17/2006  . POLYP, GALLBLADDER 11/17/2006  . TOBACCO USE, QUIT 11/17/2006  . VARICOSE VEINS, LOWER EXTREMITIES 11/17/2006    Past Surgical History:  Procedure Laterality Date  . KNEE ARTHROSCOPY     Left  . PROSTATE SURGERY  October 2016   [facility] [place]/ Dr. Georgetta Haber   . SHOULDER SURGERY     Left  . VASECTOMY    . VITRECTOMY  2009   Right    Social History   Social History  . Marital status: Married    Spouse name: Presenter, broadcasting  . Number of children: 3  . Years of education: N/A   Occupational History  . Airline pilot    Social History Main Topics  . Smoking status: Never Smoker  . Smokeless tobacco: Never Used  . Alcohol use Yes     Comment: 5-6/week  . Drug use: No  . Sexual activity: Not on file   Other Topics Concern  . Not on file   Social History Narrative  . No narrative on file    Current  Outpatient Prescriptions on File Prior to Visit  Medication Sig Dispense Refill  . ACCU-CHEK AVIVA PLUS test strip CHECK BLOOD SUGAR 5 TIMES PER DAY 500 each 3  . ACCU-CHEK SOFTCLIX LANCETS lancets Use to check blood sugar 5 times per day. Dx Code: E10.8 100 each 12  . aspirin 325 MG tablet Take 325 mg by mouth daily.      . Blood Glucose Monitoring Suppl (ACCU-CHEK AVIVA PLUS) W/DEVICE KIT Use to check blood sugar 5 times per day. Dx code: E10.8 1 kit 2  . Insulin Syringe-Needle U-100 (B-D INS SYR ULTRAFINE .3CC/30G) 30G X 1/2" 0.3 ML MISC Use as directed Four times a day Dx code: E10.8 400 each 2  . sildenafil (REVATIO) 20 MG tablet 2-5 pills as needed    . sildenafil (REVATIO) 20 MG tablet TAKE 2-5 TABLETS BY MOUTH AS NEEDED FOR SEXUAL ACTIVITY 50 tablet 11   No current facility-administered medications on file prior to visit.     Allergies  Allergen Reactions  . Penicillins     REACTION: unspecified reaction    Family History  Problem Relation Age of Onset  . Cancer Father   . Hypertension Mother   . Varicose Veins Maternal Aunt   . Varicose Veins Maternal Grandmother  BP 132/72   Pulse 84   Wt 198 lb (89.8 kg)   SpO2 97%   BMI 31.01 kg/m    Review of Systems He denies LOC    Objective:   Physical Exam VITAL SIGNS:  See vs page GENERAL: no distress Pulses: foot pulses are intact bilaterally.   MSK: no deformity of the feet or ankles.  CV: 1+ left leg edema (trace on the right), and bilat vv's.   Skin:  no ulcer on the feet or ankles.  normal color and temp on the feet and ankles Neuro: sensation is intact to touch on the feet and ankles.     Lab Results  Component Value Date   HGBA1C 8.3 08/30/2016      Assessment & Plan:  Type 1 DM: he needs increased rx.  Emphasizing basal is helping. CAD: due for recheck.   Patient Instructions  Please come back for a regular physical appointment in 3 months  Please increase the lantus to 34 units daily (but  please continue to reduce, if you are going to be active).  You can split if you want to.   Take humalog approx 3 units 3 times a day (just before each meal).  You can take 1-2 extra units for a large meal or blood sugar. over 200.   check your blood sugar 5 times a day: before the 3 meals, and at bedtime.  also check if you have symptoms of your blood sugar being too high or too low.  please keep a record of the readings and bring it to your next appointment here.  You can write it on any piece of paper.  please call us sooner if your blood sugar goes below 70, or if you have a lot of readings over 200.  On this type of insulin schedule, you should eat meals on a regular schedule.  If a meal is missed or significantly delayed, your blood sugar could go low.   Please redo the treadmill test.  you will receive a phone call, about a day and time for an appointment.

## 2016-09-06 ENCOUNTER — Encounter: Payer: Self-pay | Admitting: Endocrinology

## 2016-09-06 LAB — COLOGUARD: Cologuard: NEGATIVE

## 2016-09-24 ENCOUNTER — Telehealth: Payer: Self-pay | Admitting: Endocrinology

## 2016-09-24 NOTE — Telephone Encounter (Signed)
Patient would like a call back about cologaurd results.  Ty,  -LL

## 2016-09-24 NOTE — Telephone Encounter (Signed)
Routing to you °

## 2016-10-01 ENCOUNTER — Encounter: Payer: Self-pay | Admitting: Endocrinology

## 2016-10-05 NOTE — Telephone Encounter (Signed)
Would like a cal back RE cologaurd test results.   Cell: [phone]  Ty,  -LL

## 2016-10-07 ENCOUNTER — Encounter: Payer: Self-pay | Admitting: Endocrinology

## 2016-10-07 LAB — HM DIABETES EYE EXAM

## 2016-10-07 NOTE — Telephone Encounter (Signed)
Called patient and notified him of negative results.

## 2016-10-18 ENCOUNTER — Other Ambulatory Visit: Payer: Self-pay | Admitting: Endocrinology

## 2016-10-18 ENCOUNTER — Ambulatory Visit (INDEPENDENT_AMBULATORY_CARE_PROVIDER_SITE_OTHER): Payer: 59

## 2016-10-18 DIAGNOSIS — I2510 Atherosclerotic heart disease of native coronary artery without angina pectoris: Secondary | ICD-10-CM

## 2016-10-18 DIAGNOSIS — E108 Type 1 diabetes mellitus with unspecified complications: Secondary | ICD-10-CM

## 2016-10-18 LAB — EXERCISE TOLERANCE TEST
CHL CUP MPHR: 162 {beats}/min
CHL CUP RESTING HR STRESS: 74 {beats}/min
CSEPHR: 87 %
CSEPPHR: 141 {beats}/min
Estimated workload: 7 METS
Exercise duration (min): 5 min
Exercise duration (sec): 54 s
RPE: 13

## 2016-11-29 ENCOUNTER — Encounter: Payer: 59 | Admitting: Endocrinology

## 2016-12-29 ENCOUNTER — Encounter: Payer: 59 | Admitting: Endocrinology

## 2017-01-08 ENCOUNTER — Other Ambulatory Visit: Payer: Self-pay | Admitting: Endocrinology

## 2017-02-25 ENCOUNTER — Ambulatory Visit (INDEPENDENT_AMBULATORY_CARE_PROVIDER_SITE_OTHER): Payer: 59 | Admitting: Endocrinology

## 2017-02-25 ENCOUNTER — Encounter: Payer: Self-pay | Admitting: Endocrinology

## 2017-02-25 VITALS — BP 144/76 | HR 81 | Wt 202.4 lb

## 2017-02-25 DIAGNOSIS — Z23 Encounter for immunization: Secondary | ICD-10-CM | POA: Diagnosis not present

## 2017-02-25 DIAGNOSIS — E108 Type 1 diabetes mellitus with unspecified complications: Secondary | ICD-10-CM

## 2017-02-25 DIAGNOSIS — I8390 Asymptomatic varicose veins of unspecified lower extremity: Secondary | ICD-10-CM

## 2017-02-25 DIAGNOSIS — Z0000 Encounter for general adult medical examination without abnormal findings: Secondary | ICD-10-CM | POA: Diagnosis not present

## 2017-02-25 LAB — CBC WITH DIFFERENTIAL/PLATELET
BASOS PCT: 0.8 %
Basophils Absolute: 52 cells/uL (ref 0–200)
EOS ABS: 91 {cells}/uL (ref 15–500)
Eosinophils Relative: 1.4 %
HEMATOCRIT: 45.8 % (ref 38.5–50.0)
Hemoglobin: 15.4 g/dL (ref 13.2–17.1)
Lymphs Abs: 1794 cells/uL (ref 850–3900)
MCH: 30.8 pg (ref 27.0–33.0)
MCHC: 33.6 g/dL (ref 32.0–36.0)
MCV: 91.6 fL (ref 80.0–100.0)
MPV: 10.3 fL (ref 7.5–12.5)
Monocytes Relative: 7.5 %
NEUTROS PCT: 62.7 %
Neutro Abs: 4076 cells/uL (ref 1500–7800)
PLATELETS: 283 10*3/uL (ref 140–400)
RBC: 5 10*6/uL (ref 4.20–5.80)
RDW: 13 % (ref 11.0–15.0)
Total Lymphocyte: 27.6 %
WBC: 6.5 10*3/uL (ref 3.8–10.8)
WBCMIX: 488 {cells}/uL (ref 200–950)

## 2017-02-25 LAB — HEPATIC FUNCTION PANEL
ALT: 18 U/L (ref 0–53)
AST: 15 U/L (ref 0–37)
Albumin: 4 g/dL (ref 3.5–5.2)
Alkaline Phosphatase: 53 U/L (ref 39–117)
BILIRUBIN DIRECT: 0.1 mg/dL (ref 0.0–0.3)
BILIRUBIN TOTAL: 0.6 mg/dL (ref 0.2–1.2)
Total Protein: 7.1 g/dL (ref 6.0–8.3)

## 2017-02-25 LAB — BASIC METABOLIC PANEL
BUN: 13 mg/dL (ref 6–23)
CHLORIDE: 103 meq/L (ref 96–112)
CO2: 32 meq/L (ref 19–32)
CREATININE: 0.78 mg/dL (ref 0.40–1.50)
Calcium: 9.2 mg/dL (ref 8.4–10.5)
GFR: 108.15 mL/min (ref >60.00)
Glucose, Bld: 99 mg/dL (ref 70–99)
Potassium: 4.1 mEq/L (ref 3.5–5.1)
Sodium: 140 mEq/L (ref 135–145)

## 2017-02-25 LAB — MICROALBUMIN / CREATININE URINE RATIO
CREATININE, U: 63.3 mg/dL
MICROALB UR: 0.8 mg/dL (ref 0.0–1.9)
Microalb Creat Ratio: 1.3 mg/g (ref 0.0–30.0)

## 2017-02-25 LAB — URINALYSIS, ROUTINE W REFLEX MICROSCOPIC
Bilirubin Urine: NEGATIVE
HGB URINE DIPSTICK: NEGATIVE
KETONES UR: NEGATIVE
LEUKOCYTES UA: NEGATIVE
NITRITE: NEGATIVE
RBC / HPF: NONE SEEN (ref 0–?)
Specific Gravity, Urine: 1.01 (ref 1.000–1.030)
TOTAL PROTEIN, URINE-UPE24: NEGATIVE
URINE GLUCOSE: NEGATIVE
UROBILINOGEN UA: 0.2 (ref 0.0–1.0)
pH: 8 (ref 5.0–8.0)

## 2017-02-25 LAB — LIPID PANEL
Cholesterol: 196 mg/dL (ref 0–200)
HDL: 65.2 mg/dL (ref >39.00)
LDL Cholesterol: 113 mg/dL — ABNORMAL HIGH (ref 0–99)
NONHDL: 130.36
Total CHOL/HDL Ratio: 3
Triglycerides: 89 mg/dL (ref 0.0–149.0)
VLDL: 17.8 mg/dL (ref 0.0–40.0)

## 2017-02-25 LAB — TSH: TSH: 1.59 u[IU]/mL (ref 0.35–4.50)

## 2017-02-25 LAB — POCT GLYCOSYLATED HEMOGLOBIN (HGB A1C): HEMOGLOBIN A1C: 8.1

## 2017-02-25 MED ORDER — INSULIN GLARGINE 100 UNIT/ML ~~LOC~~ SOLN: 35.0000 [IU] | SUBCUTANEOUS | 3 refills | Status: DC

## 2017-02-25 NOTE — Patient Instructions (Signed)
Please come back for a follow-up appointment in 3 months Please increase the lantus to 35 units daily (but please continue to take less, if you are going to be active).  You can split if you want to.   Take humalog approx 4 units 3 times a day (just before each meal).  You can take 1-2 extra units for a large meal or blood sugar. over 200.   check your blood sugar 5 times a day: before the 3 meals, and at bedtime.  also check if you have symptoms of your blood sugar being too high or too low.  please keep a record of the readings and bring it to your next appointment here.  You can write it on any piece of paper.  please call us sooner if your blood sugar goes below 70, or if you have a lot of readings over 200.  On this type of insulin schedule, you should eat meals on a regular schedule.  If a meal is missed or significantly delayed, your blood sugar could go low.

## 2017-02-25 NOTE — Progress Notes (Signed)
Subjective:    Patient ID: Donald Lopez, male    DOB: [birth year], 62 y.o.   MRN: [id number]  HPI Pt is here for regular wellness examination, and is feeling pretty well in general, and says chronic med probs are stable, except as noted below Past Medical History:  Diagnosis Date  . Cancer [facility])    prostate cancer  . DIABETES MELLITUS, TYPE I 11/17/2006  . DIABETIC  RETINOPATHY 11/17/2006  . 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 HYPERLIPIDEMIA 11/17/2006  . POLYP, GALLBLADDER 11/17/2006  . TOBACCO USE, QUIT 11/17/2006  . VARICOSE VEINS, LOWER EXTREMITIES 11/17/2006    Past Surgical History:  Procedure Laterality Date  . KNEE ARTHROSCOPY     Left  . PROSTATE SURGERY  October 2016   [facility] [place]/ Dr. Georgetta Haber   . SHOULDER SURGERY     Left  . VASECTOMY    . VITRECTOMY  2009   Right    Social History   Socioeconomic History  . Marital status: Married    Spouse name: Presenter, broadcasting  . Number of children: 3  . Years of education: Not on file  . Highest education level: Not on file  Social Needs  . Financial resource strain: Not on file  . Food insecurity - worry: Not on file  . Food insecurity - inability: Not on file  . Transportation needs - medical: Not on file  . Transportation needs - non-medical: Not on file  Occupational History  . Occupation: Airline pilot  Tobacco Use  . Smoking status: Never Smoker  . Smokeless tobacco: Never Used  Substance and Sexual Activity  . Alcohol use: Yes    Comment: 5-6/week  . Drug use: No  . Sexual activity: Not on file  Other Topics Concern  . Not on file  Social History Narrative  . Not on file    Current Outpatient Medications on File Prior to Visit  Medication Sig Dispense Refill  . ACCU-CHEK AVIVA PLUS test strip CHECK BLOOD SUGAR 5 TIMES PER DAY 500 each 3  . ACCU-CHEK SOFTCLIX LANCETS lancets Use to check blood sugar 5 times per day. Dx Code: E10.8 100 each 12  . aspirin 325 MG tablet Take 325 mg by mouth  daily.      . Blood Glucose Monitoring Suppl (ACCU-CHEK AVIVA PLUS) w/Device KIT USE AS DIRECTED 1 kit 0  . insulin aspart (NOVOLOG) 100 UNIT/ML injection Inject 3-5 Units into the skin 3 (three) times daily with meals. 10 mL 3  . Insulin Syringe-Needle U-100 (B-D INS SYR ULTRAFINE .3CC/30G) 30G X 1/2" 0.3 ML MISC Use as directed Four times a day Dx code: E10.8 400 each 2  . sildenafil (REVATIO) 20 MG tablet 2-5 pills as needed     No current facility-administered medications on file prior to visit.     Allergies  Allergen Reactions  . Penicillins     REACTION: unspecified reaction    Family History  Problem Relation Age of Onset  . Cancer Father   . Hypertension Mother   . Varicose Veins Maternal Aunt   . Varicose Veins Maternal Grandmother     BP (!) 144/76 (BP Location: Right Arm, Patient Position: Sitting, Cuff Size: Normal)   Pulse 81   Wt 202 lb 6 oz (91.8 kg)   SpO2 97%   BMI 31.70 kg/m     Review of Systems Denies fever, fatigue, visual loss, hearing loss, chest pain, sob, back pain, depression, cold intolerance, BRBPR, hematuria, urinary hesitancy, syncope, numbness,  allergy sxs, easy bruising, and rash.  He has easy bruising.     Objective:   Physical Exam VS: see vs page GEN: no distress HEAD: head: no deformity eyes: no periorbital swelling, no proptosis external nose and ears are normal mouth: no lesion seen NECK: supple, thyroid is not enlarged CHEST WALL: no deformity LUNGS: clear to auscultation.  BREASTS:  No gynecomastia CV: reg rate and rhythm, no murmur ABD: abdomen is soft, nontender.  no hepatosplenomegaly.  not distended.  no hernia RECTAL/PROSTATE: sees urology MUSCULOSKELETAL: muscle bulk and strength are grossly normal.  no obvious joint swelling.  gait is normal and steady.  PULSES: no carotid bruit NEURO:  cn 2-12 grossly intact.   readily moves all 4's.  SKIN:  Normal texture and temperature.  No rash or suspicious lesion is visible.     NODES:  None palpable at the neck PSYCH: alert, well-oriented.  Does not appear anxious nor depressed.      Assessment & Plan:  Wellness visit today, with problems stable, except as noted.   SEPARATE EVALUATION FOLLOWS--EACH PROBLEM HERE IS NEW, NOT RESPONDING TO TREATMENT, OR POSES SIGNIFICANT RISK TO THE PATIENT'S HEALTH:  HISTORY OF THE PRESENT ILLNESS: Pt returns for f/u of diabetes mellitus: DM type: 1 Dx'ed: 5038 Complications: CAD and proliferative retinopathy Therapy: insulin since soon after dx.  DKA: never Severe hypoglycemia: never.   Pancreatitis: never.   Other: he has declined pump and continuous glucose monitor.   Interval history: no cbg record, but states cbg's are variable.  It is in general higher as the day goes on PAST MEDICAL HISTORY Past Medical History:  Diagnosis Date  . Cancer [facility])    prostate cancer  . DIABETES MELLITUS, TYPE I 11/17/2006  . DIABETIC  RETINOPATHY 11/17/2006  . 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 HYPERLIPIDEMIA 11/17/2006  . POLYP, GALLBLADDER 11/17/2006  . TOBACCO USE, QUIT 11/17/2006  . VARICOSE VEINS, LOWER EXTREMITIES 11/17/2006    Past Surgical History:  Procedure Laterality Date  . KNEE ARTHROSCOPY     Left  . PROSTATE SURGERY  October 2016   [facility] [place]/ Dr. Georgetta Haber   . SHOULDER SURGERY     Left  . VASECTOMY    . VITRECTOMY  2009   Right    Social History   Socioeconomic History  . Marital status: Married    Spouse name: Presenter, broadcasting  . Number of children: 3  . Years of education: Not on file  . Highest education level: Not on file  Social Needs  . Financial resource strain: Not on file  . Food insecurity - worry: Not on file  . Food insecurity - inability: Not on file  . Transportation needs - medical: Not on file  . Transportation needs - non-medical: Not on file  Occupational History  . Occupation: Airline pilot  Tobacco Use  . Smoking status: Never Smoker  . Smokeless tobacco: Never Used   Substance and Sexual Activity  . Alcohol use: Yes    Comment: 5-6/week  . Drug use: No  . Sexual activity: Not on file  Other Topics Concern  . Not on file  Social History Narrative  . Not on file    Current Outpatient Medications on File Prior to Visit  Medication Sig Dispense Refill  . ACCU-CHEK AVIVA PLUS test strip CHECK BLOOD SUGAR 5 TIMES PER DAY 500 each 3  . ACCU-CHEK SOFTCLIX LANCETS lancets Use to check blood sugar 5 times per day. Dx Code: E10.8 100 each 12  .  aspirin 325 MG tablet Take 325 mg by mouth daily.      . Blood Glucose Monitoring Suppl (ACCU-CHEK AVIVA PLUS) w/Device KIT USE AS DIRECTED 1 kit 0  . insulin aspart (NOVOLOG) 100 UNIT/ML injection Inject 3-5 Units into the skin 3 (three) times daily with meals. 10 mL 3  . Insulin Syringe-Needle U-100 (B-D INS SYR ULTRAFINE .3CC/30G) 30G X 1/2" 0.3 ML MISC Use as directed Four times a day Dx code: E10.8 400 each 2  . sildenafil (REVATIO) 20 MG tablet 2-5 pills as needed     No current facility-administered medications on file prior to visit.     Allergies  Allergen Reactions  . Penicillins     REACTION: unspecified reaction    Family History  Problem Relation Age of Onset  . Cancer Father   . Hypertension Mother   . Varicose Veins Maternal Aunt   . Varicose Veins Maternal Grandmother     BP (!) 144/76 (BP Location: Right Arm, Patient Position: Sitting, Cuff Size: Normal)   Pulse 81   Wt 202 lb 6 oz (91.8 kg)   SpO2 97%   BMI 31.70 kg/m   REVIEW OF SYSTEMS: He says vv's are painful PHYSICAL EXAMINATION: VITAL SIGNS:  See vs page.  GENERAL: no distress.  Pulses: dorsalis pedis intact bilat.   MSK: no deformity of the feet.  CV: 1+ bilat leg edema, and bilat vv's (L>R).   Skin:  no ulcer on the feet.  normal color and temp on the feet.   Neuro: sensation is intact to touch on the feet.    LAB/XRAY RESULTS:  Lab Results  Component Value Date   HGBA1C 8.1 02/25/2017   IMPRESSION:  Type 1 DM:  he needs increased rx.  The plan of emphasizing the basal is working well.  Painful vv's, persistent PLAN:   Please increase the lantus to 35 units daily (but please continue to take less, if you are going to be active).  You can split if you want to.   Take humalog approx 4 units 3 times a day (just before each meal).  You can take 1-2 extra units for a large meal or blood sugar. over 200 Ref vasc surg

## 2017-05-03 ENCOUNTER — Other Ambulatory Visit: Payer: Self-pay

## 2017-05-03 DIAGNOSIS — I8390 Asymptomatic varicose veins of unspecified lower extremity: Secondary | ICD-10-CM

## 2017-05-27 ENCOUNTER — Ambulatory Visit: Payer: 59 | Admitting: Endocrinology

## 2017-05-31 ENCOUNTER — Encounter (HOSPITAL_COMMUNITY): Payer: 59

## 2017-05-31 ENCOUNTER — Ambulatory Visit: Payer: 59 | Admitting: Vascular Surgery

## 2017-06-03 ENCOUNTER — Other Ambulatory Visit: Payer: Self-pay | Admitting: Endocrinology

## 2017-07-08 ENCOUNTER — Other Ambulatory Visit: Payer: Self-pay

## 2017-07-08 MED ORDER — GLUCOSE BLOOD VI STRP
ORAL_STRIP | 3 refills | Status: DC
Start: 2017-07-08 — End: 2018-07-17

## 2017-07-15 ENCOUNTER — Encounter: Payer: Self-pay | Admitting: Endocrinology

## 2017-07-15 ENCOUNTER — Ambulatory Visit (INDEPENDENT_AMBULATORY_CARE_PROVIDER_SITE_OTHER): Payer: 59 | Admitting: Endocrinology

## 2017-07-15 VITALS — BP 144/78 | HR 89 | Ht 67.0 in | Wt 199.8 lb

## 2017-07-15 DIAGNOSIS — E108 Type 1 diabetes mellitus with unspecified complications: Secondary | ICD-10-CM | POA: Diagnosis not present

## 2017-07-15 LAB — POCT GLYCOSYLATED HEMOGLOBIN (HGB A1C): Hemoglobin A1C: 8.4 % — AB (ref 4.0–5.6)

## 2017-07-15 MED ORDER — SILDENAFIL CITRATE 100 MG PO TABS: 100.0000 mg | ORAL_TABLET | Freq: Every day | ORAL | 5 refills | Status: DC | PRN

## 2017-07-15 MED ORDER — INSULIN ASPART 100 UNIT/ML ~~LOC~~ SOLN: 3.0000 [IU] | Freq: Three times a day (TID) | SUBCUTANEOUS | 0 refills | Status: DC

## 2017-07-15 MED ORDER — INSULIN GLARGINE 100 UNIT/ML ~~LOC~~ SOLN: 40.0000 [IU] | SUBCUTANEOUS | 3 refills | Status: DC

## 2017-07-15 NOTE — Patient Instructions (Addendum)
Please come back for a follow-up appointment in 3 months Please increase the lantus to 40 units each morning (but please continue to take less, if you are going to be active).  You can split if you want to.   Take humalog approx 3 units 3 times a day (just before each meal).  You can take 1-2 extra units for a large meal or blood sugar over 200.   check your blood sugar 5 times a day: before the 3 meals, and at bedtime.  also check if you have symptoms of your blood sugar being too high or too low.  please keep a record of the readings and bring it to your next appointment here.  You can write it on any piece of paper.  please call us sooner if your blood sugar goes below 70, or if you have a lot of readings over 200.  On this type of insulin schedule, you should eat meals on a regular schedule.  If a meal is missed or significantly delayed, your blood sugar could go low.

## 2017-07-15 NOTE — Progress Notes (Signed)
Subjective:    Patient ID: Donald Lopez, male    DOB: 03-21-1955, 62 y.o.   MRN: [id number]  HPI Pt returns for f/u of diabetes mellitus: DM type: 1 Dx'ed: 0160 Complications: CAD and proliferative retinopathy Therapy: insulin since soon after dx.  DKA: never Severe hypoglycemia: never.   Pancreatitis: never.   Other: he has declined pump and continuous glucose monitor.   Interval history: no cbg record, but states cbg's are variable.  It is in general higher as the day goes on.  He has having mild hypoglycemia with activity on vacation, but he is back home now.   Past Medical History:  Diagnosis Date  . Cancer [facility])    prostate cancer  . DIABETES MELLITUS, TYPE I 11/17/2006  . DIABETIC  RETINOPATHY 11/17/2006  . 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 HYPERLIPIDEMIA 11/17/2006  . POLYP, GALLBLADDER 11/17/2006  . TOBACCO USE, QUIT 11/17/2006  . VARICOSE VEINS, LOWER EXTREMITIES 11/17/2006    Past Surgical History:  Procedure Laterality Date  . KNEE ARTHROSCOPY     Left  . PROSTATE SURGERY  October 2016   [facility] [place]/ Dr. Georgetta Haber   . SHOULDER SURGERY     Left  . VASECTOMY    . VITRECTOMY  2009   Right    Social History   Socioeconomic History  . Marital status: Married    Spouse name: Presenter, broadcasting  . Number of children: 3  . Years of education: Not on file  . Highest education level: Not on file  Occupational History  . Occupation: Airline pilot  Social Needs  . Financial resource strain: Not on file  . Food insecurity:    Worry: Not on file    Inability: Not on file  . Transportation needs:    Medical: Not on file    Non-medical: Not on file  Tobacco Use  . Smoking status: Never Smoker  . Smokeless tobacco: Never Used  Substance and Sexual Activity  . Alcohol use: Yes    Comment: 5-6/week  . Drug use: No  . Sexual activity: Not on file  Lifestyle  . Physical activity:    Days per week: Not on file    Minutes per session: Not on file  .  Stress: Not on file  Relationships  . Social connections:    Talks on phone: Not on file    Gets together: Not on file    Attends religious service: Not on file    Active member of club or organization: Not on file    Attends meetings of clubs or organizations: Not on file    Relationship status: Not on file  . Intimate partner violence:    Fear of current or ex partner: Not on file    Emotionally abused: Not on file    Physically abused: Not on file    Forced sexual activity: Not on file  Other Topics Concern  . Not on file  Social History Narrative  . Not on file    Current Outpatient Medications on File Prior to Visit  Medication Sig Dispense Refill  . ACCU-CHEK SOFTCLIX LANCETS lancets Use to check blood sugar 5 times per day. Dx Code: E10.8 100 each 12  . aspirin 325 MG tablet Take 325 mg by mouth daily.      . Blood Glucose Monitoring Suppl (ACCU-CHEK AVIVA PLUS) w/Device KIT USE AS DIRECTED 1 kit 0  . glucose blood (ACCU-CHEK AVIVA PLUS) test strip CHECK BLOOD SUGAR 5 TIMES PER DAY  500 each 3  . Insulin Syringe-Needle U-100 (B-D INS SYR ULTRAFINE .3CC/30G) 30G X 1/2" 0.3 ML MISC Use as directed Four times a day Dx code: E10.8 400 each 2   No current facility-administered medications on file prior to visit.     Allergies  Allergen Reactions  . Penicillins     REACTION: unspecified reaction    Family History  Problem Relation Age of Onset  . Cancer Father   . Hypertension Mother   . Varicose Veins Maternal Aunt   . Varicose Veins Maternal Grandmother     BP (!) 144/78 (BP Location: Right Arm, Patient Position: Sitting, Cuff Size: Normal)   Pulse 89   Ht '5\' 7"'$  (1.702 m)   Wt 199 lb 12.8 oz (90.6 kg)   SpO2 97%   BMI 31.29 kg/m    Review of Systems He denies LOC    Objective:   Physical Exam VITAL SIGNS:  See vs page.  GENERAL: no distress.  Pulses: dorsalis pedis intact bilat.   MSK: no deformity of the feet.  CV: 1+ bilat leg edema, and bilat vv's  (L>R).   Skin:  no ulcer on the feet.  normal color and temp on the feet.   Neuro: sensation is intact to touch on the feet.    Lab Results  Component Value Date   HGBA1C 8.4 (A) 07/15/2017      Assessment & Plan:  Type 1 DM: emphasizing basal is helping  Patient Instructions  Please come back for a follow-up appointment in 3 months Please increase the lantus to 40 units each morning (but please continue to take less, if you are going to be active).  You can split if you want to.   Take humalog approx 3 units 3 times a day (just before each meal).  You can take 1-2 extra units for a large meal or blood sugar over 200.   check your blood sugar 5 times a day: before the 3 meals, and at bedtime.  also check if you have symptoms of your blood sugar being too high or too low.  please keep a record of the readings and bring it to your next appointment here.  You can write it on any piece of paper.  please call us sooner if your blood sugar goes below 70, or if you have a lot of readings over 200.  On this type of insulin schedule, you should eat meals on a regular schedule.  If a meal is missed or significantly delayed, your blood sugar could go low.

## 2017-08-02 ENCOUNTER — Other Ambulatory Visit: Payer: Self-pay | Admitting: Endocrinology

## 2017-08-04 ENCOUNTER — Other Ambulatory Visit: Payer: Self-pay

## 2017-08-04 DIAGNOSIS — E108 Type 1 diabetes mellitus with unspecified complications: Secondary | ICD-10-CM

## 2017-08-04 MED ORDER — "INSULIN SYRINGE-NEEDLE U-100 30G X 1/2"" 0.3 ML MISC": 2 refills | Status: DC

## 2017-09-30 ENCOUNTER — Other Ambulatory Visit: Payer: Self-pay | Admitting: Endocrinology

## 2017-10-21 ENCOUNTER — Ambulatory Visit: Payer: 59 | Admitting: Endocrinology

## 2017-11-01 ENCOUNTER — Other Ambulatory Visit: Payer: Self-pay | Admitting: Endocrinology

## 2017-11-11 ENCOUNTER — Other Ambulatory Visit: Payer: Self-pay

## 2017-11-11 MED ORDER — TRUE METRIX AIR GLUCOSE METER W/DEVICE KIT: 1.0000 | PACK | 0 refills | Status: DC | PRN

## 2017-11-11 MED ORDER — BD SWAB SINGLE USE REGULAR PADS: MEDICATED_PAD | 3 refills | Status: DC

## 2017-11-11 NOTE — Telephone Encounter (Signed)
Communication received from [facility] requesting refill of alcohol swabs. Refill sent electronically today.

## 2017-11-21 ENCOUNTER — Ambulatory Visit: Payer: 59 | Admitting: Endocrinology

## 2017-12-05 ENCOUNTER — Other Ambulatory Visit: Payer: Self-pay | Admitting: Endocrinology

## 2018-01-03 ENCOUNTER — Other Ambulatory Visit: Payer: Self-pay | Admitting: Endocrinology

## 2018-01-23 ENCOUNTER — Ambulatory Visit: Payer: 59 | Admitting: Endocrinology

## 2018-02-03 ENCOUNTER — Encounter: Payer: Self-pay | Admitting: Endocrinology

## 2018-02-03 ENCOUNTER — Ambulatory Visit (INDEPENDENT_AMBULATORY_CARE_PROVIDER_SITE_OTHER): Payer: 59 | Admitting: Endocrinology

## 2018-02-03 VITALS — BP 136/72 | HR 78 | Ht 67.0 in | Wt 207.0 lb

## 2018-02-03 DIAGNOSIS — E108 Type 1 diabetes mellitus with unspecified complications: Secondary | ICD-10-CM | POA: Diagnosis not present

## 2018-02-03 LAB — POCT GLYCOSYLATED HEMOGLOBIN (HGB A1C): HEMOGLOBIN A1C: 8.7 % — AB (ref 4.0–5.6)

## 2018-02-03 MED ORDER — INSULIN GLARGINE 100 UNIT/ML ~~LOC~~ SOLN: 37.0000 [IU] | SUBCUTANEOUS | 11 refills | Status: DC

## 2018-02-03 NOTE — Patient Instructions (Addendum)
I have sent a prescription to your pharmacy, to increase the lantus to 37 units each morning. Please continue the same novolog. check your blood sugar 5 times a day.  vary the time of day when you check, between before the 3 meals, and at bedtime.  also check if you have symptoms of your blood sugar being too high or too low.  please keep a record of the readings and bring it to your next appointment here (or you can bring the meter itself).  You can write it on any piece of paper.  please call us sooner if your blood sugar goes below 70, or if you have a lot of readings over 200. If you are going to be active, and can anticipate it, skip the prior dose of novolog.  If not, eat a light snack with it.   Please let me know if you decide to use a continuous glucose monitor.   Please come back for a follow-up appointment in 3 months.

## 2018-02-03 NOTE — Progress Notes (Signed)
Subjective:    Patient ID: Donald Lopez, male    DOB: 1955-12-26, 63 y.o.   MRN: [id number]  HPI Pt returns for f/u of diabetes mellitus:  DM type: 1 Dx'ed: 2951 Complications: CAD and PDR.  Therapy: insulin since soon after dx.  DKA: never.  Severe hypoglycemia: never.   Pancreatitis: never.   Other: he has declined pump and continuous glucose monitor.   Interval history: no cbg record, but states cbg's are variable.  He has mild hypoglycemia approx once per week.  This usually happens with exertion.  Past Medical History:  Diagnosis Date  . Cancer [facility])    prostate cancer  . DIABETES MELLITUS, TYPE I 11/17/2006  . DIABETIC  RETINOPATHY 11/17/2006  . 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 HYPERLIPIDEMIA 11/17/2006  . POLYP, GALLBLADDER 11/17/2006  . TOBACCO USE, QUIT 11/17/2006  . VARICOSE VEINS, LOWER EXTREMITIES 11/17/2006    Past Surgical History:  Procedure Laterality Date  . KNEE ARTHROSCOPY     Left  . PROSTATE SURGERY  October 2016   [facility] [place]/ Dr. Georgetta Haber   . SHOULDER SURGERY     Left  . VASECTOMY    . VITRECTOMY  2009   Right    Social History   Socioeconomic History  . Marital status: Married    Spouse name: Presenter, broadcasting  . Number of children: 3  . Years of education: Not on file  . Highest education level: Not on file  Occupational History  . Occupation: Airline pilot  Social Needs  . Financial resource strain: Not on file  . Food insecurity:    Worry: Not on file    Inability: Not on file  . Transportation needs:    Medical: Not on file    Non-medical: Not on file  Tobacco Use  . Smoking status: Never Smoker  . Smokeless tobacco: Never Used  Substance and Sexual Activity  . Alcohol use: Yes    Comment: 5-6/week  . Drug use: No  . Sexual activity: Not on file  Lifestyle  . Physical activity:    Days per week: Not on file    Minutes per session: Not on file  . Stress: Not on file  Relationships  . Social connections:   Talks on phone: Not on file    Gets together: Not on file    Attends religious service: Not on file    Active member of club or organization: Not on file    Attends meetings of clubs or organizations: Not on file    Relationship status: Not on file  . Intimate partner violence:    Fear of current or ex partner: Not on file    Emotionally abused: Not on file    Physically abused: Not on file    Forced sexual activity: Not on file  Other Topics Concern  . Not on file  Social History Narrative  . Not on file    Current Outpatient Medications on File Prior to Visit  Medication Sig Dispense Refill  . ACCU-CHEK SOFTCLIX LANCETS lancets Use to check blood sugar 5 times per day. Dx Code: E10.8 100 each 12  . Alcohol Swabs (B-D SINGLE USE SWABS REGULAR) PADS Use as directed 1 each 3  . aspirin 325 MG tablet Take 325 mg by mouth daily.      . Blood Glucose Monitoring Suppl (TRUE METRIX AIR GLUCOSE METER) w/Device KIT 1 each by Does not apply route as needed (May check blood sugars up to 5 times  per day). 1 kit 0  . glucose blood (ACCU-CHEK AVIVA PLUS) test strip CHECK BLOOD SUGAR 5 TIMES PER DAY 500 each 3  . Insulin Syringe-Needle U-100 (B-D INS SYR ULTRAFINE .3CC/30G) 30G X 1/2" 0.3 ML MISC Use as directed Four times a day Dx code: E10.8 400 each 2  . NOVOLOG 100 UNIT/ML injection INJECT 3-5 UNITS SUBCUTANEOUSLY 3 TIMES DAILY BEFORE MEALS. 10 mL 0  . sildenafil (VIAGRA) 100 MG tablet Take 1 tablet (100 mg total) by mouth daily as needed for erectile dysfunction. 30 tablet 5   No current facility-administered medications on file prior to visit.     Allergies  Allergen Reactions  . Penicillins     REACTION: unspecified reaction    Family History  Problem Relation Age of Onset  . Cancer Father   . Hypertension Mother   . Varicose Veins Maternal Aunt   . Varicose Veins Maternal Grandmother     BP 136/72 (BP Location: Right Arm, Patient Position: Sitting, Cuff Size: Large)   Pulse 78    Ht _0  (1.702 m)   Wt 207 lb (93.9 kg)   SpO2 96%   BMI 32.42 kg/m    Review of Systems Denies LOC    Objective:   Physical Exam VITAL SIGNS:  See vs page.  GENERAL: no distress.  Pulses: dorsalis pedis intact bilat.   MSK: no deformity of the feet.  CV: 1+ bilat leg edema, and bilat vv's (L>R).   Skin:  no ulcer on the feet.  normal color and temp on the feet.   Neuro: sensation is intact to touch on the feet.     Lab Results  Component Value Date   HGBA1C 8.7 (A) 02/03/2018   Lab Results  Component Value Date   CREATININE 0.78 02/25/2017   BUN 13 02/25/2017   NA 140 02/25/2017   K 4.1 02/25/2017   CL 103 02/25/2017   CO2 32 02/25/2017   Lab Results  Component Value Date   MICROALBUR 0.8 02/25/2017       Assessment & Plan:  Type 1 DM, with CAD: he needs increased rx   Patient Instructions  I have sent a prescription to your pharmacy, to increase the lantus to 37 units each morning. Please continue the same novolog. check your blood sugar 5 times a day.  vary the time of day when you check, between before the 3 meals, and at bedtime.  also check if you have symptoms of your blood sugar being too high or too low.  please keep a record of the readings and bring it to your next appointment here (or you can bring the meter itself).  You can write it on any piece of paper.  please call us sooner if your blood sugar goes below 70, or if you have a lot of readings over 200. If you are going to be active, and can anticipate it, skip the prior dose of novolog.  If not, eat a light snack with it.   Please let me know if you decide to use a continuous glucose monitor.   Please come back for a follow-up appointment in 3 months.

## 2018-02-07 ENCOUNTER — Other Ambulatory Visit: Payer: Self-pay | Admitting: Endocrinology

## 2018-02-07 MED ORDER — INSULIN GLARGINE 100 UNIT/ML ~~LOC~~ SOLN: 37.0000 [IU] | SUBCUTANEOUS | 11 refills | Status: DC

## 2018-03-29 ENCOUNTER — Other Ambulatory Visit: Payer: Self-pay | Admitting: Endocrinology

## 2018-05-04 ENCOUNTER — Other Ambulatory Visit: Payer: Self-pay | Admitting: Endocrinology

## 2018-05-08 ENCOUNTER — Ambulatory Visit: Payer: 59 | Admitting: Endocrinology

## 2018-07-17 ENCOUNTER — Ambulatory Visit: Payer: 59 | Admitting: Endocrinology

## 2018-07-17 ENCOUNTER — Other Ambulatory Visit: Payer: Self-pay | Admitting: Endocrinology

## 2018-07-17 NOTE — Telephone Encounter (Signed)
Please refill x 1 F/u is due  

## 2018-07-28 ENCOUNTER — Other Ambulatory Visit: Payer: Self-pay | Admitting: Endocrinology

## 2018-07-28 MED ORDER — ACCU-CHEK AVIVA PLUS VI STRP: ORAL_STRIP | 3 refills | Status: DC

## 2018-07-31 ENCOUNTER — Other Ambulatory Visit: Payer: Self-pay

## 2018-07-31 DIAGNOSIS — E108 Type 1 diabetes mellitus with unspecified complications: Secondary | ICD-10-CM

## 2018-07-31 MED ORDER — ACCU-CHEK AVIVA PLUS VI STRP: ORAL_STRIP | 3 refills | Status: DC

## 2018-07-31 MED ORDER — ACCU-CHEK SOFTCLIX LANCETS MISC: 12 refills | Status: DC

## 2018-08-02 ENCOUNTER — Other Ambulatory Visit: Payer: Self-pay | Admitting: Endocrinology

## 2018-09-13 ENCOUNTER — Other Ambulatory Visit: Payer: Self-pay | Admitting: Endocrinology

## 2018-09-13 NOTE — Telephone Encounter (Signed)
LOV 02/03/18. Per your note, pt was advised to f/u in 76mo. No future appt noted. Canceled 05/08/18 and 07/17/18. Please advise how you wish to proceed.

## 2018-09-13 NOTE — Telephone Encounter (Signed)
Please refill x 1 F/u is due  

## 2018-09-21 ENCOUNTER — Other Ambulatory Visit: Payer: Self-pay | Admitting: Endocrinology

## 2018-09-21 DIAGNOSIS — E108 Type 1 diabetes mellitus with unspecified complications: Secondary | ICD-10-CM

## 2018-09-26 ENCOUNTER — Other Ambulatory Visit: Payer: Self-pay

## 2018-09-26 ENCOUNTER — Other Ambulatory Visit: Payer: Self-pay | Admitting: Endocrinology

## 2018-09-26 DIAGNOSIS — E108 Type 1 diabetes mellitus with unspecified complications: Secondary | ICD-10-CM

## 2018-09-26 MED ORDER — "BD INSULIN SYRINGE ULTRAFINE 30G X 1/2"" 0.3 ML MISC": 2 refills | Status: DC

## 2018-09-26 NOTE — Telephone Encounter (Signed)
RX sent

## 2018-09-26 NOTE — Telephone Encounter (Signed)
MEDICATION: Insulin Syringe-Needle U-100 (B-D INS SYR ULTRAFINE .3CC/30G) 30G X 1/2" 0.3 ML MISC  PHARMACY:   [place] [place] [phone] (Phone) [phone] (Fax)    IS THIS A 90 DAY SUPPLY : Yes  IS PATIENT OUT OF MEDICATION: No  IF NOT; HOW MUCH IS LEFT: 4 days  LAST APPOINTMENT DATE: @9 /03/2018  NEXT APPOINTMENT DATE:@9 /11/2018  DO WE HAVE YOUR PERMISSION TO LEAVE A DETAILED MESSAGE: Yes  OTHER COMMENTS:    **Let patient know to contact pharmacy at the end of the day to make sure medication is ready. **  ** Please notify patient to allow 48-72 hours to process**  **Encourage patient to contact the pharmacy for refills or they can request refills through [facility]**

## 2018-09-29 ENCOUNTER — Other Ambulatory Visit: Payer: Self-pay

## 2018-09-29 ENCOUNTER — Encounter: Payer: Self-pay | Admitting: Endocrinology

## 2018-09-29 ENCOUNTER — Ambulatory Visit (INDEPENDENT_AMBULATORY_CARE_PROVIDER_SITE_OTHER): Payer: 59 | Admitting: Endocrinology

## 2018-09-29 VITALS — BP 170/90 | HR 83 | Ht 67.0 in | Wt 204.6 lb

## 2018-09-29 DIAGNOSIS — E103519 Type 1 diabetes mellitus with proliferative diabetic retinopathy with macular edema, unspecified eye: Secondary | ICD-10-CM

## 2018-09-29 DIAGNOSIS — E108 Type 1 diabetes mellitus with unspecified complications: Secondary | ICD-10-CM | POA: Diagnosis not present

## 2018-09-29 LAB — POCT GLYCOSYLATED HEMOGLOBIN (HGB A1C): Hemoglobin A1C: 7.9 % — AB (ref 4.0–5.6)

## 2018-09-29 MED ORDER — INSULIN GLARGINE 100 UNIT/ML ~~LOC~~ SOLN: 35.0000 [IU] | SUBCUTANEOUS | 11 refills | Status: DC

## 2018-09-29 MED ORDER — INSULIN ASPART 100 UNIT/ML ~~LOC~~ SOLN: 2.0000 [IU] | Freq: Three times a day (TID) | SUBCUTANEOUS | 11 refills | Status: DC

## 2018-09-29 NOTE — Progress Notes (Signed)
Subjective:    Patient ID: Donald Lopez, male    DOB: 1955-06-30, 63 y.o.   MRN: [id number]  HPI Pt returns for f/u of diabetes mellitus:  DM type: 1 Dx'ed: [id number] Complications: CAD and PDR.  Therapy: insulin since soon after dx.   DKA: never.  Severe hypoglycemia: never.   Pancreatitis: never.   Other: he has declined pump and continuous glucose monitor; he takes multiple daily injections, but emphasizing basal has helped.   Interval history: no cbg record, but states cbg's are vary widely.  He has mild hypoglycemia approx once per week.  This usually happens before lunch.  It is in general highest at HS, due to snacking then.  He takes lantus 34/d, and novolog 3-5 units 3 times a day (just before each meal).   Past Medical History:  Diagnosis Date  . Cancer [facility])    prostate cancer  . DIABETES MELLITUS, TYPE I 11/17/2006  . DIABETIC  RETINOPATHY 11/17/2006  . 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 HYPERLIPIDEMIA 11/17/2006  . POLYP, GALLBLADDER 11/17/2006  . TOBACCO USE, QUIT 11/17/2006  . VARICOSE VEINS, LOWER EXTREMITIES 11/17/2006    Past Surgical History:  Procedure Laterality Date  . KNEE ARTHROSCOPY     Left  . PROSTATE SURGERY  October 2016   [facility]/ Dr. Georgetta Haber   . SHOULDER SURGERY     Left  . VASECTOMY    . VITRECTOMY  2009   Right    Social History   Socioeconomic History  . Marital status: Married    Spouse name: Presenter, broadcasting  . Number of children: 3  . Years of education: Not on file  . Highest education level: Not on file  Occupational History  . Occupation: Airline pilot  Social Needs  . Financial resource strain: Not on file  . Food insecurity    Worry: Not on file    Inability: Not on file  . Transportation needs    Medical: Not on file    Non-medical: Not on file  Tobacco Use  . Smoking status: Never Smoker  . Smokeless tobacco: Never Used  Substance and Sexual Activity  . Alcohol use: Yes    Comment: 5-6/week  . Drug  use: No  . Sexual activity: Not on file  Lifestyle  . Physical activity    Days per week: Not on file    Minutes per session: Not on file  . Stress: Not on file  Relationships  . Social Herbalist on phone: Not on file    Gets together: Not on file    Attends religious service: Not on file    Active member of club or organization: Not on file    Attends meetings of clubs or organizations: Not on file    Relationship status: Not on file  . Intimate partner violence    Fear of current or ex partner: Not on file    Emotionally abused: Not on file    Physically abused: Not on file    Forced sexual activity: Not on file  Other Topics Concern  . Not on file  Social History Narrative  . Not on file    Current Outpatient Medications on File Prior to Visit  Medication Sig Dispense Refill  . Accu-Chek Softclix Lancets lancets Use to check blood sugar 5 times per day. Dx Code: E10.8 100 each 12  . Alcohol Swabs (B-D SINGLE USE SWABS REGULAR) PADS USE AS DIRECTED 100 each 0  .  aspirin 325 MG tablet Take 325 mg by mouth daily.      Marland Kitchen glucose blood (ACCU-CHEK AVIVA PLUS) test strip Use to check blood sugar 5 times per day. Dx Code: E10.8 500 strip 3  . Insulin Syringe-Needle U-100 (B-D INS SYR ULTRAFINE .3CC/30G) 30G X 1/2" 0.3 ML MISC Use as directed Four times a day Dx code: E10.8 400 each 2  . sildenafil (VIAGRA) 100 MG tablet Take 1 tablet (100 mg total) by mouth daily as needed for erectile dysfunction. 30 tablet 5   No current facility-administered medications on file prior to visit.     Allergies  Allergen Reactions  . Penicillins     REACTION: unspecified reaction    Family History  Problem Relation Age of Onset  . Cancer Father   . Hypertension Mother   . Varicose Veins Maternal Aunt   . Varicose Veins Maternal Grandmother     BP (!) 170/90 (BP Location: Left Arm, Patient Position: Sitting, Cuff Size: Normal)   Pulse 83   Ht 5\' 7"  (1.702 m)   Wt 204 lb 9.6  oz (92.8 kg)   SpO2 97%   BMI 32.04 kg/m    Review of Systems Denies LOC    Objective:   Physical Exam VITAL SIGNS:  See vs page GENERAL: no distress Pulses: dorsalis pedis intact bilat.   MSK: no deformity of the feet CV: 2+ left leg edema, and trace on the right; also bilat vv's.   Skin:  no ulcer on the feet.  normal color and temp on the feet.  Neuro: sensation is intact to touch on the feet.    Lab Results  Component Value Date   CREATININE 0.78 02/25/2017   BUN 13 02/25/2017   NA 140 02/25/2017   K 4.1 02/25/2017   CL 103 02/25/2017   CO2 32 02/25/2017     Lab Results  Component Value Date   HGBA1C 7.9 (A) 09/29/2018       Assessment & Plan:  HTN: is noted today Type 1 DM, with PDR: he needs increased rx Edema, persistent, due to an old injury.  Hypoglycemia: this limits aggressiveness of glycemic control  Patient Instructions  Your blood pressure is high today.  Please see your primary care provider soon, to have it rechecked.   I have sent a prescription to your pharmacy, to increase the lantus to 35 units each morning.   Please change the novolog to 2-5 units 3 times a day (just before each meal).   check your blood sugar 5 times a day.  vary the time of day when you check, between before the 3 meals, and at bedtime.  also check if you have symptoms of your blood sugar being too high or too low.  please keep a record of the readings and bring it to your next appointment here (or you can bring the meter itself).  You can write it on any piece of paper.  please call us sooner if your blood sugar goes below 70, or if you have a lot of readings over 200. If you are going to be active, and can anticipate it, skip the prior dose of novolog.  If not, eat a light snack with it.    Please come back for a follow-up appointment in 3 months.

## 2018-09-29 NOTE — Patient Instructions (Addendum)
Your blood pressure is high today.  Please see your primary care provider soon, to have it rechecked.   I have sent a prescription to your pharmacy, to increase the lantus to 35 units each morning.   Please change the novolog to 2-5 units 3 times a day (just before each meal).   check your blood sugar 5 times a day.  vary the time of day when you check, between before the 3 meals, and at bedtime.  also check if you have symptoms of your blood sugar being too high or too low.  please keep a record of the readings and bring it to your next appointment here (or you can bring the meter itself).  You can write it on any piece of paper.  please call us sooner if your blood sugar goes below 70, or if you have a lot of readings over 200. If you are going to be active, and can anticipate it, skip the prior dose of novolog.  If not, eat a light snack with it.    Please come back for a follow-up appointment in 3 months.

## 2018-10-13 ENCOUNTER — Telehealth: Payer: Self-pay | Admitting: Internal Medicine

## 2018-10-13 NOTE — Telephone Encounter (Signed)
Dr Jenny Reichmann,  See message below. Appears patient is currently seeing Dr Loanne Drilling. He is no longer able to be his PCP and patient is needing a physical.  You have been changed to his PCP (was done on 09/29/2018 while he was at Dr Cordelia Pen office for a visit).  Is it okay to schedule him with you for a physical and keep you as his PCP?

## 2018-10-13 NOTE — Telephone Encounter (Signed)
Copied from [place] [id number]. Topic: Appointment Scheduling - Scheduling Inquiry for Clinic >> Oct 13, 2018 10:10 AM Richardo Priest, NT wrote: Reason for CRM: Patient called in stating he used to use Dr.Ellison for his physicals, however, that MD no longer does any and was suggested to use Dr.Brodyn. Dr.Yomar is listed as patient's PCP however has only seen him once. Please advise and call back [phone].

## 2018-10-13 NOTE — Telephone Encounter (Signed)
Ok with me, thanks.

## 2018-10-13 NOTE — Telephone Encounter (Signed)
Appointment scheduled.

## 2018-10-30 ENCOUNTER — Telehealth: Payer: Self-pay | Admitting: Endocrinology

## 2018-10-30 ENCOUNTER — Other Ambulatory Visit: Payer: Self-pay

## 2018-10-30 DIAGNOSIS — E108 Type 1 diabetes mellitus with unspecified complications: Secondary | ICD-10-CM

## 2018-10-30 MED ORDER — INSULIN ASPART 100 UNIT/ML ~~LOC~~ SOLN: 2.0000 [IU] | Freq: Three times a day (TID) | SUBCUTANEOUS | 11 refills | Status: DC

## 2018-10-30 MED ORDER — INSULIN GLARGINE 100 UNIT/ML ~~LOC~~ SOLN: 35.0000 [IU] | SUBCUTANEOUS | 11 refills | Status: DC

## 2018-10-30 NOTE — Telephone Encounter (Signed)
MEDICATION: Lantus   Novolog  PHARMACY:  [facility]  IS THIS A 90 DAY SUPPLY :  YES  IS PATIENT OUT OF MEDICATION: NO  IF NOT; HOW MUCH IS LEFT:   LAST APPOINTMENT DATE: @9 /11/2018  NEXT APPOINTMENT DATE:@12 /11/2018  DO WE HAVE YOUR PERMISSION TO LEAVE A DETAILED MESSAGE: YES,   OTHER COMMENTS:    **Let patient know to contact pharmacy at the end of the day to make sure medication is ready. **  ** Please notify patient to allow 48-72 hours to process**  **Encourage patient to contact the pharmacy for refills or they can request refills through [facility]**

## 2018-10-30 NOTE — Telephone Encounter (Signed)
E-Prescribing Status: Receipt confirmed by pharmacy (10/30/2018 8:55 AM EDT)

## 2018-11-13 ENCOUNTER — Other Ambulatory Visit: Payer: Self-pay

## 2018-11-13 DIAGNOSIS — E108 Type 1 diabetes mellitus with unspecified complications: Secondary | ICD-10-CM

## 2018-11-13 MED ORDER — ACCU-CHEK AVIVA DEVI: 0 refills | Status: DC

## 2018-11-17 ENCOUNTER — Encounter: Payer: Self-pay | Admitting: Internal Medicine

## 2018-11-17 ENCOUNTER — Other Ambulatory Visit: Payer: Self-pay

## 2018-11-17 ENCOUNTER — Other Ambulatory Visit (INDEPENDENT_AMBULATORY_CARE_PROVIDER_SITE_OTHER): Payer: 59

## 2018-11-17 ENCOUNTER — Ambulatory Visit (INDEPENDENT_AMBULATORY_CARE_PROVIDER_SITE_OTHER): Payer: 59 | Admitting: Internal Medicine

## 2018-11-17 VITALS — BP 142/88 | HR 69 | Temp 97.9°F | Ht 67.0 in | Wt 206.0 lb

## 2018-11-17 DIAGNOSIS — E611 Iron deficiency: Secondary | ICD-10-CM

## 2018-11-17 DIAGNOSIS — E559 Vitamin D deficiency, unspecified: Secondary | ICD-10-CM

## 2018-11-17 DIAGNOSIS — E108 Type 1 diabetes mellitus with unspecified complications: Secondary | ICD-10-CM

## 2018-11-17 DIAGNOSIS — Z23 Encounter for immunization: Secondary | ICD-10-CM

## 2018-11-17 DIAGNOSIS — Z0000 Encounter for general adult medical examination without abnormal findings: Secondary | ICD-10-CM

## 2018-11-17 DIAGNOSIS — E538 Deficiency of other specified B group vitamins: Secondary | ICD-10-CM

## 2018-11-17 LAB — CBC WITH DIFFERENTIAL/PLATELET
Basophils Absolute: 0 10*3/uL (ref 0.0–0.1)
Basophils Relative: 0.6 % (ref 0.0–3.0)
Eosinophils Absolute: 0.2 10*3/uL (ref 0.0–0.7)
Eosinophils Relative: 4 % (ref 0.0–5.0)
HCT: 44.1 % (ref 39.0–52.0)
Hemoglobin: 15.2 g/dL (ref 13.0–17.0)
Lymphocytes Relative: 37.6 % (ref 12.0–46.0)
Lymphs Abs: 2.3 10*3/uL (ref 0.7–4.0)
MCHC: 34.4 g/dL (ref 30.0–36.0)
MCV: 92.4 fl (ref 78.0–100.0)
Monocytes Absolute: 0.5 10*3/uL (ref 0.1–1.0)
Monocytes Relative: 7.7 % (ref 3.0–12.0)
Neutro Abs: 3 10*3/uL (ref 1.4–7.7)
Neutrophils Relative %: 50.1 % (ref 43.0–77.0)
Platelets: 247 10*3/uL (ref 150.0–400.0)
RBC: 4.78 Mil/uL (ref 4.22–5.81)
RDW: 13.4 % (ref 11.5–15.5)
WBC: 6 10*3/uL (ref 4.0–10.5)

## 2018-11-17 LAB — URINALYSIS, ROUTINE W REFLEX MICROSCOPIC
Bilirubin Urine: NEGATIVE
Hgb urine dipstick: NEGATIVE
Ketones, ur: NEGATIVE
Leukocytes,Ua: NEGATIVE
Nitrite: NEGATIVE
RBC / HPF: NONE SEEN (ref 0–?)
Specific Gravity, Urine: 1.02 (ref 1.000–1.030)
Total Protein, Urine: NEGATIVE
Urine Glucose: NEGATIVE
Urobilinogen, UA: 0.2 (ref 0.0–1.0)
pH: 6.5 (ref 5.0–8.0)

## 2018-11-17 LAB — LIPID PANEL
Cholesterol: 180 mg/dL (ref 0–200)
HDL: 49.4 mg/dL (ref >39.00)
LDL Cholesterol: 119 mg/dL — ABNORMAL HIGH (ref 0–99)
NonHDL: 131.04
Total CHOL/HDL Ratio: 4
Triglycerides: 61 mg/dL (ref 0.0–149.0)
VLDL: 12.2 mg/dL (ref 0.0–40.0)

## 2018-11-17 LAB — HEPATIC FUNCTION PANEL
ALT: 15 U/L (ref 0–53)
AST: 13 U/L (ref 0–37)
Albumin: 3.9 g/dL (ref 3.5–5.2)
Alkaline Phosphatase: 65 U/L (ref 39–117)
Bilirubin, Direct: 0.1 mg/dL (ref 0.0–0.3)
Total Bilirubin: 0.5 mg/dL (ref 0.2–1.2)
Total Protein: 6.3 g/dL (ref 6.0–8.3)

## 2018-11-17 LAB — BASIC METABOLIC PANEL
BUN: 15 mg/dL (ref 6–23)
CO2: 30 mEq/L (ref 19–32)
Calcium: 8.9 mg/dL (ref 8.4–10.5)
Chloride: 105 mEq/L (ref 96–112)
Creatinine, Ser: 0.8 mg/dL (ref 0.40–1.50)
GFR: 98.26 mL/min (ref >60.00)
Glucose, Bld: 161 mg/dL — ABNORMAL HIGH (ref 70–99)
Potassium: 4.2 mEq/L (ref 3.5–5.1)
Sodium: 141 mEq/L (ref 135–145)

## 2018-11-17 LAB — IBC PANEL
Iron: 65 ug/dL (ref 42–165)
Saturation Ratios: 24.1 % (ref 20.0–50.0)
Transferrin: 193 mg/dL — ABNORMAL LOW (ref 212.0–360.0)

## 2018-11-17 LAB — HEMOGLOBIN A1C: Hgb A1c MFr Bld: 8.4 % — ABNORMAL HIGH (ref 4.6–6.5)

## 2018-11-17 LAB — MICROALBUMIN / CREATININE URINE RATIO
Creatinine,U: 138.1 mg/dL
Microalb Creat Ratio: 0.7 mg/g (ref 0.0–30.0)
Microalb, Ur: 1 mg/dL (ref 0.0–1.9)

## 2018-11-17 LAB — VITAMIN B12: Vitamin B-12: 173 pg/mL — ABNORMAL LOW (ref 211–911)

## 2018-11-17 LAB — VITAMIN D 25 HYDROXY (VIT D DEFICIENCY, FRACTURES): VITD: 17.54 ng/mL — ABNORMAL LOW (ref 30.00–100.00)

## 2018-11-17 LAB — TSH: TSH: 1.5 u[IU]/mL (ref 0.35–4.50)

## 2018-11-17 LAB — PSA: PSA: 0 ng/mL — ABNORMAL LOW (ref 0.10–4.00)

## 2018-11-17 MED ORDER — ZOSTER VAC RECOMB ADJUVANTED 50 MCG/0.5ML IM SUSR: 0.5000 mL | Freq: Once | INTRAMUSCULAR | 1 refills | Status: AC

## 2018-11-17 NOTE — Progress Notes (Signed)
Subjective:    Patient ID: Donald Lopez, male    DOB: [birth year], 63 y.o.   MRN: [id number]  HPI  Here for wellness and f/u;  Overall doing ok;  Pt denies Chest pain, worsening SOB, DOE, wheezing, orthopnea, PND, worsening LE edema, palpitations, dizziness or syncope.  Pt denies neurological change such as new headache, facial or extremity weakness.  Pt denies polydipsia, polyuria, or low sugar symptoms. Pt states overall good compliance with treatment and medications, good tolerability, and has been trying to follow appropriate diet.  Pt denies worsening depressive symptoms, suicidal ideation or panic. No fever, night sweats, wt loss, loss of appetite, or other constitutional symptoms.  Pt states good ability with ADL's, has low fall risk, home safety reviewed and adequate, no other significant changes in hearing or vision, and only occasionally active with exercise.  No new complaints.  Follows Type I DM with endo.   Past Medical History:  Diagnosis Date  . Cancer [facility])    prostate cancer  . DIABETES MELLITUS, TYPE I 11/17/2006  . DIABETIC  RETINOPATHY 11/17/2006  . 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 HYPERLIPIDEMIA 11/17/2006  . POLYP, GALLBLADDER 11/17/2006  . TOBACCO USE, QUIT 11/17/2006  . VARICOSE VEINS, LOWER EXTREMITIES 11/17/2006   Past Surgical History:  Procedure Laterality Date  . KNEE ARTHROSCOPY     Left  . PROSTATE SURGERY  October 2016   [facility]/ Dr. Georgetta Haber   . SHOULDER SURGERY     Left  . VASECTOMY    . VITRECTOMY  2009   Right    reports that he has never smoked. He has never used smokeless tobacco. He reports current alcohol use. He reports that he does not use drugs. family history includes Cancer in his father; Hypertension in his mother; Varicose Veins in his maternal aunt and maternal grandmother. Allergies  Allergen Reactions  . Penicillins     REACTION: unspecified reaction   Review of Systems Constitutional: Negative for other unusual  diaphoresis, sweats, appetite or weight changes HENT: Negative for other worsening hearing loss, ear pain, facial swelling, mouth sores or neck stiffness.   Eyes: Negative for other worsening pain, redness or other visual disturbance.  Respiratory: Negative for other stridor or swelling Cardiovascular: Negative for other palpitations or other chest pain  Gastrointestinal: Negative for worsening diarrhea or loose stools, blood in stool, distention or other pain Genitourinary: Negative for hematuria, flank pain or other change in urine volume.  Musculoskeletal: Negative for myalgias or other joint swelling.  Skin: Negative for other color change, or other wound or worsening drainage.  Neurological: Negative for other syncope or numbness. Hematological: Negative for other adenopathy or swelling Psychiatric/Behavioral: Negative for hallucinations, other worsening agitation, SI, self-injury, or new decreased concentration All otherwise neg per pt     Objective:   Physical Exam BP (!) 142/88   Pulse 69   Temp 97.9 F (36.6 C) (Oral)   Ht 5\' 7"  (1.702 m)   Wt 206 lb (93.4 kg)   SpO2 99%   BMI 32.26 kg/m  VS noted,  Constitutional: Pt is oriented to person, place, and time. Appears well-developed and well-nourished, in no significant distress and comfortable Head: Normocephalic and atraumatic  Eyes: Conjunctivae and EOM are normal. Pupils are equal, round, and reactive to light Right Ear: External ear normal without discharge Left Ear: External ear normal without discharge Nose: Nose without discharge or deformity Mouth/Throat: Oropharynx is without other ulcerations and moist  Neck: Normal range of motion. Neck  supple. No JVD present. No tracheal deviation present or significant neck LA or mass Cardiovascular: Normal rate, regular rhythm, normal heart sounds and intact distal pulses.   Pulmonary/Chest: WOB normal and breath sounds without rales or wheezing  Abdominal: Soft. Bowel sounds  are normal. NT. No HSM  Musculoskeletal: Normal range of motion. Exhibits no edema Lymphadenopathy: Has no other cervical adenopathy.  Neurological: Pt is alert and oriented to person, place, and time. Pt has normal reflexes. No cranial nerve deficit. Motor grossly intact, Gait intact Skin: Skin is warm and dry. No rash noted or new ulcerations Psychiatric:  Has normal mood and affect. Behavior is normal without agitation All otherwise neg per pt Lab Results  Component Value Date   WBC 6.0 11/17/2018   HGB 15.2 11/17/2018   HCT 44.1 11/17/2018   PLT 247.0 11/17/2018   GLUCOSE 161 (H) 11/17/2018   CHOL 180 11/17/2018   TRIG 61.0 11/17/2018   HDL 49.40 11/17/2018   LDLDIRECT 123.2 01/22/2013   LDLCALC 119 (H) 11/17/2018   ALT 15 11/17/2018   AST 13 11/17/2018   NA 141 11/17/2018   K 4.2 11/17/2018   CL 105 11/17/2018   CREATININE 0.80 11/17/2018   BUN 15 11/17/2018   CO2 30 11/17/2018   TSH 1.50 11/17/2018   PSA 0.00 Repeated and verified X2. (L) 11/17/2018   HGBA1C 8.4 (H) 11/17/2018   MICROALBUR 1.0 11/17/2018      Assessment & Plan:

## 2018-11-17 NOTE — Patient Instructions (Addendum)
You had the Tdap shot today  Your shingles shot was sent to the pharmacy  We have discussed the Cardiac CT Score test to measure the calcification level (if any) in your heart arteries.  This test has been ordered in our [place], so please call [facility] directly, as they prefer this, at [phone] to be scheduled.  Please continue all other medications as before, and refills have been done if requested.  Please have the pharmacy call with any other refills you may need.  Please continue your efforts at being more active, low cholesterol diet, and weight control.  You are otherwise up to date with prevention measures today.  Please keep your appointments with your specialists as you may have planned  Please return in 1 year for your yearly visit, or sooner if needed, with Lab testing done 3-5 days before

## 2018-11-18 ENCOUNTER — Encounter: Payer: Self-pay | Admitting: Internal Medicine

## 2018-11-18 ENCOUNTER — Other Ambulatory Visit: Payer: Self-pay | Admitting: Internal Medicine

## 2018-11-18 DIAGNOSIS — E108 Type 1 diabetes mellitus with unspecified complications: Secondary | ICD-10-CM

## 2018-11-18 MED ORDER — INSULIN GLARGINE 100 UNIT/ML ~~LOC~~ SOLN: 40.0000 [IU] | SUBCUTANEOUS | 11 refills | Status: DC

## 2018-11-18 MED ORDER — VITAMIN D (ERGOCALCIFEROL) 1.25 MG (50000 UNIT) PO CAPS: 50000.0000 [IU] | ORAL_CAPSULE | ORAL | 0 refills | Status: DC

## 2018-11-18 MED ORDER — ATORVASTATIN CALCIUM 10 MG PO TABS: 10.0000 mg | ORAL_TABLET | Freq: Every day | ORAL | 3 refills | Status: DC

## 2018-11-18 NOTE — Assessment & Plan Note (Signed)
Stable, for endo f/u as planned

## 2018-11-18 NOTE — Assessment & Plan Note (Signed)

## 2018-11-24 ENCOUNTER — Other Ambulatory Visit: Payer: Self-pay

## 2018-11-24 ENCOUNTER — Ambulatory Visit (INDEPENDENT_AMBULATORY_CARE_PROVIDER_SITE_OTHER): Payer: 59

## 2018-11-24 DIAGNOSIS — E538 Deficiency of other specified B group vitamins: Secondary | ICD-10-CM | POA: Diagnosis not present

## 2018-11-24 MED ORDER — CYANOCOBALAMIN 1000 MCG/ML IJ SOLN
1000.0000 ug | Freq: Once | INTRAMUSCULAR | Status: AC
  Administered 2018-11-24: 10:00:00 1000 ug via INTRAMUSCULAR

## 2018-11-24 NOTE — Progress Notes (Signed)
Medical screening examination/treatment/procedure(s) were performed by non-physician practitioner and as supervising physician I was immediately available for consultation/collaboration. I agree with above. James Teron, MD   

## 2018-12-01 ENCOUNTER — Encounter: Payer: Self-pay | Admitting: Internal Medicine

## 2018-12-25 ENCOUNTER — Other Ambulatory Visit: Payer: Self-pay

## 2018-12-25 ENCOUNTER — Ambulatory Visit (INDEPENDENT_AMBULATORY_CARE_PROVIDER_SITE_OTHER): Payer: 59

## 2018-12-25 DIAGNOSIS — E538 Deficiency of other specified B group vitamins: Secondary | ICD-10-CM | POA: Diagnosis not present

## 2018-12-25 MED ORDER — CYANOCOBALAMIN 1000 MCG/ML IJ SOLN
1000.0000 ug | Freq: Once | INTRAMUSCULAR | Status: AC
  Administered 2018-12-25: 1000 ug via INTRAMUSCULAR

## 2018-12-25 NOTE — Progress Notes (Signed)
Medical screening examination/treatment/procedure(s) were performed by non-physician practitioner and as supervising physician I was immediately available for consultation/collaboration. I agree with above. James Audley, MD   

## 2018-12-29 ENCOUNTER — Ambulatory Visit: Payer: 59 | Admitting: Endocrinology

## 2019-01-22 ENCOUNTER — Inpatient Hospital Stay: Admission: RE | Admit: 2019-01-22 | Payer: 59 | Source: Ambulatory Visit

## 2019-01-29 ENCOUNTER — Ambulatory Visit (INDEPENDENT_AMBULATORY_CARE_PROVIDER_SITE_OTHER): Payer: 59

## 2019-01-29 ENCOUNTER — Other Ambulatory Visit: Payer: Self-pay

## 2019-01-29 DIAGNOSIS — E538 Deficiency of other specified B group vitamins: Secondary | ICD-10-CM | POA: Diagnosis not present

## 2019-01-29 MED ORDER — CYANOCOBALAMIN 1000 MCG/ML IJ SOLN
1000.0000 ug | Freq: Once | INTRAMUSCULAR | Status: AC
  Administered 2019-01-29: 1000 ug via INTRAMUSCULAR

## 2019-01-29 NOTE — Progress Notes (Signed)
Medical screening examination/treatment/procedure(s) were performed by non-physician practitioner and as supervising physician I was immediately available for consultation/collaboration. I agree with above. Hazim Treadway Rhonin, MD   

## 2019-02-06 ENCOUNTER — Encounter: Payer: Self-pay | Admitting: Internal Medicine

## 2019-02-07 ENCOUNTER — Other Ambulatory Visit: Payer: Self-pay | Admitting: Internal Medicine

## 2019-02-07 ENCOUNTER — Ambulatory Visit (INDEPENDENT_AMBULATORY_CARE_PROVIDER_SITE_OTHER)
Admission: RE | Admit: 2019-02-07 | Discharge: 2019-02-07 | Disposition: A | Discharge status: Home / Self Care | Payer: Self-pay | Source: Ambulatory Visit | Attending: Internal Medicine | Admitting: Internal Medicine

## 2019-02-07 ENCOUNTER — Other Ambulatory Visit: Payer: Self-pay

## 2019-02-07 DIAGNOSIS — R931 Abnormal findings on diagnostic imaging of heart and coronary circulation: Secondary | ICD-10-CM

## 2019-02-07 DIAGNOSIS — E108 Type 1 diabetes mellitus with unspecified complications: Secondary | ICD-10-CM

## 2019-02-12 ENCOUNTER — Encounter: Payer: Self-pay | Admitting: Internal Medicine

## 2019-02-13 ENCOUNTER — Ambulatory Visit: Payer: 59 | Admitting: Endocrinology

## 2019-02-14 ENCOUNTER — Telehealth (HOSPITAL_COMMUNITY): Payer: Self-pay | Admitting: *Deleted

## 2019-02-14 ENCOUNTER — Encounter (HOSPITAL_COMMUNITY): Payer: Self-pay | Admitting: *Deleted

## 2019-02-14 NOTE — Telephone Encounter (Signed)
Patient was contacted to change appointment time. He was scheduled in a 2day slot. Patient given detailed instructions per Myocardial Perfusion Study Information Sheet for the test on 02/28/2019 at 1045. Patient notified to arrive 15 minutes early and that it is imperative to arrive on time for appointment to keep from having the test rescheduled.  If you need to cancel or reschedule your appointment, please call the office within 24 hours of your appointment. . Patient verbalized understanding.[place] Mychart letter sent with instructions

## 2019-02-20 ENCOUNTER — Encounter: Payer: Self-pay | Admitting: Endocrinology

## 2019-02-26 ENCOUNTER — Encounter: Payer: Self-pay | Admitting: Internal Medicine

## 2019-02-28 ENCOUNTER — Encounter (HOSPITAL_COMMUNITY): Payer: 59

## 2019-02-28 ENCOUNTER — Ambulatory Visit (HOSPITAL_COMMUNITY): Payer: 59 | Attending: Cardiology

## 2019-02-28 ENCOUNTER — Other Ambulatory Visit: Payer: Self-pay

## 2019-02-28 DIAGNOSIS — R931 Abnormal findings on diagnostic imaging of heart and coronary circulation: Secondary | ICD-10-CM | POA: Insufficient documentation

## 2019-02-28 LAB — MYOCARDIAL PERFUSION IMAGING
LV dias vol: 77 mL (ref 62–150)
LV sys vol: 30 mL
Peak HR: 100 {beats}/min
Rest HR: 77 {beats}/min
SDS: 2
SRS: 0
SSS: 2
TID: 1.21

## 2019-02-28 MED ORDER — REGADENOSON 0.4 MG/5ML IV SOLN
0.4000 mg | Freq: Once | INTRAVENOUS | Status: AC
  Administered 2019-02-28: 0.4 mg via INTRAVENOUS

## 2019-02-28 MED ORDER — TECHNETIUM TC 99M TETROFOSMIN IV KIT
31.8000 | PACK | Freq: Once | INTRAVENOUS | Status: AC | PRN
  Administered 2019-02-28: 31.8 via INTRAVENOUS
  Filled 2019-02-28: qty 32

## 2019-02-28 MED ORDER — TECHNETIUM TC 99M TETROFOSMIN IV KIT
10.7000 | PACK | Freq: Once | INTRAVENOUS | Status: AC | PRN
  Administered 2019-02-28: 10.7 via INTRAVENOUS
  Filled 2019-02-28: qty 11

## 2019-03-04 NOTE — Progress Notes (Signed)
Cardiology Office Note:    Date:  03/05/2019   ID:  Donald Lopez, DOB 05-Dec-1955, MRN [id number]  PCP:  Donald Borg, MD  Cardiologist:  No primary care provider on file.  Electrophysiologist:  None   Referring MD: Donald Borg, MD   Chief Complaint  Patient presents with  . Coronary Artery Disease    History of Present Illness:    Donald Lopez is a 64 y.o. male with a hx of type 1 diabetes, prostate cancer, hyperlipidemia, tobacco use who is referred by Dr. Jenny Lopez for an evaluation of elevated calcium score.  Patient underwent a calcium score on 02/07/2019, which was 1397 (97th percentile for age/gender).  Given the significant elevation in calcium score, she underwent a Lexiscan Myoview on 02/28/2019, which showed no evidence of ischemia, EF 61%.  He denies any chest pain or dyspnea.  Reports that he exercises 3-4 times per week, walk 2 to 3 miles carrying 2 pound weights and also does the bicycle or elliptical at the [facility] for 30 minutes.  Denies any exertional symptoms.  Started statin in October 2020.  Never smoked.  Father had MI in 25s.  Mother had CVA in 22s.    Past Medical History:  Diagnosis Date  . Cancer [facility])    prostate cancer  . DIABETES MELLITUS, TYPE I 11/17/2006  . DIABETIC  RETINOPATHY 11/17/2006  . 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 HYPERLIPIDEMIA 11/17/2006  . POLYP, GALLBLADDER 11/17/2006  . TOBACCO USE, QUIT 11/17/2006  . VARICOSE VEINS, LOWER EXTREMITIES 11/17/2006    Past Surgical History:  Procedure Laterality Date  . KNEE ARTHROSCOPY     Left  . PROSTATE SURGERY  October 2016   [facility]/ Dr. Georgetta Haber   . SHOULDER SURGERY     Left  . VASECTOMY    . VITRECTOMY  2009   Right    Current Medications: Current Meds  Medication Sig  . Accu-Chek Softclix Lancets lancets Use to check blood sugar 5 times per day. Dx Code: E10.8  . Alcohol Swabs (B-D SINGLE USE SWABS REGULAR) PADS USE AS DIRECTED  . aspirin EC 81 MG tablet Take 81 mg by  mouth daily.  Marland Kitchen atorvastatin (LIPITOR) 40 MG tablet Take 1 tablet (40 mg total) by mouth daily.  . Blood Glucose Monitoring Suppl (ACCU-CHEK AVIVA) device Use to monitor glucose levels 5 times daily; E10.8  . glucose blood (ACCU-CHEK AVIVA PLUS) test strip Use to check blood sugar 5 times per day. Dx Code: E10.8  . insulin aspart (NOVOLOG) 100 UNIT/ML injection Inject 2-5 Units into the skin 3 (three) times daily with meals.  . insulin glargine (LANTUS) 100 UNIT/ML injection Inject 0.4 mLs (40 Units total) into the skin every morning.  . Insulin Syringe-Needle U-100 (B-D INS SYR ULTRAFINE .3CC/30G) 30G X 1/2" 0.3 ML MISC Use as directed Four times a day Dx code: E10.8  . sildenafil (VIAGRA) 100 MG tablet Take 1 tablet (100 mg total) by mouth daily as needed for erectile dysfunction.  . Vitamin D, Ergocalciferol, (DRISDOL) 1.25 MG (50000 UT) CAPS capsule Take 1 capsule (50,000 Units total) by mouth every 7 (seven) days.  . [DISCONTINUED] aspirin 325 MG tablet Take 81 mg by mouth daily.   . [DISCONTINUED] atorvastatin (LIPITOR) 10 MG tablet Take 1 tablet (10 mg total) by mouth daily.     Allergies:   Penicillins   Social History   Socioeconomic History  . Marital status: Married    Spouse name: Donald Lopez, broadcasting  .  Number of children: 3  . Years of education: Not on file  . Highest education level: Not on file  Occupational History  . Occupation: Airline pilot  Tobacco Use  . Smoking status: Never Smoker  . Smokeless tobacco: Never Used  Substance and Sexual Activity  . Alcohol use: Yes    Comment: 5-6/week  . Drug use: No  . Sexual activity: Not on file  Other Topics Concern  . Not on file  Social History Narrative  . Not on file   Social Determinants of Health   Financial Resource Strain:   . Difficulty of Paying Living Expenses: Not on file  Food Insecurity:   . Worried About Charity fundraiser in the Last Year: Not on file  . Ran Out of Food in the Last Year: Not on  file  Transportation Needs:   . Lack of Transportation (Medical): Not on file  . Lack of Transportation (Non-Medical): Not on file  Physical Activity:   . Days of Exercise per Week: Not on file  . Minutes of Exercise per Session: Not on file  Stress:   . Feeling of Stress : Not on file  Social Connections:   . Frequency of Communication with Friends and Family: Not on file  . Frequency of Social Gatherings with Friends and Family: Not on file  . Attends Religious Services: Not on file  . Active Member of Clubs or Organizations: Not on file  . Attends Archivist Meetings: Not on file  . Marital Status: Not on file     Family History: The patient's family history includes Cancer in his father; Hypertension in his mother; Varicose Veins in his maternal aunt and maternal grandmother.  ROS:   Please see the history of present illness.     All other systems reviewed and are negative.  EKGs/Labs/Other Studies Reviewed:    The following studies were reviewed today:   EKG:  EKG is  ordered today.  The ekg ordered today demonstrates normal sinus rhythm, rate 72, no ST/T abnormalities  Recent Labs: 11/17/2018: ALT 15; BUN 15; Creatinine, Ser 0.80; Hemoglobin 15.2; Platelets 247.0; Potassium 4.2; Sodium 141; TSH 1.50  Recent Lipid Panel    Component Value Date/Time   CHOL 180 11/17/2018 0929   TRIG 61.0 11/17/2018 0929   HDL 49.40 11/17/2018 0929   CHOLHDL 4 11/17/2018 0929   VLDL 12.2 11/17/2018 0929   LDLCALC 119 (H) 11/17/2018 0929   LDLDIRECT 123.2 01/22/2013 1101    Calcium score 02/07/19: Coronary calcium score of 1397. This was 97th percentile for age and sex matched control. Recommend aggressive risk factor modification. Consider noninvasive ischemic testing.  Lexiscan Myoview 02/28/2019:  The left ventricular ejection fraction is normal (55-65%).  Nuclear stress EF: 61%.  There was no ST segment deviation noted during stress.  The study is normal. No  evidence of ischemia.  This is a low risk study.  No prior study for comparison.  Physical Exam:    VS:  BP (!) 146/77 (BP Location: Left Arm)   Pulse 72   Ht 5\' 8"  (1.727 m)   Wt 213 lb 3.2 oz (96.7 kg)   SpO2 98%   BMI 32.42 kg/m     Wt Readings from Last 3 Encounters:  03/05/19 213 lb 3.2 oz (96.7 kg)  02/28/19 206 lb (93.4 kg)  11/17/18 206 lb (93.4 kg)     GEN:  Well nourished, well developed in no acute distress HEENT: Normal NECK: No JVD  CARDIAC: RRR, no murmurs, rubs, gallops RESPIRATORY:  Clear to auscultation without rales, wheezing or rhonchi  ABDOMEN: Soft, non-tender, non-distended MUSCULOSKELETAL:  No edema; No deformity  SKIN: Warm and dry NEUROLOGIC:  Alert and oriented x 3 PSYCHIATRIC:  Normal affect   ASSESSMENT:    1. Coronary artery disease involving native coronary artery of native heart without angina pectoris   2. Hypertension, unspecified type    PLAN:    In order of problems listed above:  Coronary artery disease: calcium score on 02/07/2019 was 1397 (97th percentile for age/gender).  Given the significant elevation in calcium score, he underwent a Lexiscan Myoview on 02/28/2019, which showed no evidence of ischemia, EF 61%. -On atorvastatin 10 mg.  Last LDL 119 on 11/17/2018.  Will increase atorvastatin to 40 mg daily for goal LDL less than 70  Hypertension: will start lisinopril 10 mg daily.  Check BMET in 1 week.  Asked patient to start checking BP at home once daily.  Asked him to call in 2 weeks with results  Hyperlipidemia: LDL 119 on 11/17/2018, will increase atorvastatin as above  Type 1 diabetes: On insulin, follows with endocrinology.  RTC in 2 months   Medication Adjustments/Labs and Tests Ordered: Current medicines are reviewed at length with the patient today.  Concerns regarding medicines are outlined above.  Orders Placed This Encounter  Procedures  . Basic metabolic panel   Meds ordered this encounter  Medications  .  atorvastatin (LIPITOR) 40 MG tablet    Sig: Take 1 tablet (40 mg total) by mouth daily.    Dispense:  90 tablet    Refill:  3    Dose increase  . lisinopril (ZESTRIL) 10 MG tablet    Sig: Take 1 tablet (10 mg total) by mouth daily.    Dispense:  90 tablet    Refill:  3    Patient Instructions  Medication Instructions:  INCREASE atorvastatin (Lipitor) to 40 mg daily START Lisinopril 10 mg daily  *If you need a refill on your cardiac medications before your next appointment, please call your pharmacy*  Lab Work: BMET IN 1 WEEK If you have labs (blood work) drawn today and your tests are completely normal, you will receive your results only by: Marland Kitchen MyChart Message (if you have MyChart) OR . A paper copy in the mail If you have any lab test that is abnormal or we need to change your treatment, we will call you to review the results.  Testing/Procedures: NONE  Follow-Up: At [facility], you and your health needs are our priority.  As part of our continuing mission to provide you with exceptional heart care, we have created designated Provider Care Teams.  These Care Teams include your primary Cardiologist (physician) and Advanced Practice Providers (APPs -  Physician Assistants and Nurse Practitioners) who all work together to provide you with the care you need, when you need it.  Your next appointment:   2 month(s)  The format for your next appointment:   In Person  Provider:   Oswaldo Milian, MD  Other Instructions Take BP daily at home and write it down-call office in 2 weeks with readings     Signed, Donato Heinz, MD  03/05/2019 6:29 PM    [place]

## 2019-03-05 ENCOUNTER — Other Ambulatory Visit: Payer: Self-pay

## 2019-03-05 ENCOUNTER — Encounter: Payer: Self-pay | Admitting: Cardiology

## 2019-03-05 ENCOUNTER — Ambulatory Visit (INDEPENDENT_AMBULATORY_CARE_PROVIDER_SITE_OTHER): Payer: 59 | Admitting: *Deleted

## 2019-03-05 ENCOUNTER — Ambulatory Visit (INDEPENDENT_AMBULATORY_CARE_PROVIDER_SITE_OTHER): Payer: 59 | Admitting: Cardiology

## 2019-03-05 VITALS — BP 146/77 | HR 72 | Ht 68.0 in | Wt 213.2 lb

## 2019-03-05 DIAGNOSIS — I2510 Atherosclerotic heart disease of native coronary artery without angina pectoris: Secondary | ICD-10-CM | POA: Diagnosis not present

## 2019-03-05 DIAGNOSIS — E538 Deficiency of other specified B group vitamins: Secondary | ICD-10-CM

## 2019-03-05 DIAGNOSIS — I10 Essential (primary) hypertension: Secondary | ICD-10-CM | POA: Diagnosis not present

## 2019-03-05 DIAGNOSIS — E785 Hyperlipidemia, unspecified: Secondary | ICD-10-CM

## 2019-03-05 MED ORDER — LISINOPRIL 10 MG PO TABS: 10.0000 mg | ORAL_TABLET | Freq: Every day | ORAL | 3 refills | Status: DC

## 2019-03-05 MED ORDER — ATORVASTATIN CALCIUM 40 MG PO TABS: 40.0000 mg | ORAL_TABLET | Freq: Every day | ORAL | 3 refills | Status: DC

## 2019-03-05 MED ORDER — CYANOCOBALAMIN 1000 MCG/ML IJ SOLN
1000.0000 ug | Freq: Once | INTRAMUSCULAR | Status: AC
  Administered 2019-03-05: 1000 ug via INTRAMUSCULAR

## 2019-03-05 NOTE — Patient Instructions (Signed)
Medication Instructions:  INCREASE atorvastatin (Lipitor) to 40 mg daily START Lisinopril 10 mg daily  *If you need a refill on your cardiac medications before your next appointment, please call your pharmacy*  Lab Work: BMET IN 1 WEEK If you have labs (blood work) drawn today and your tests are completely normal, you will receive your results only by: Marland Kitchen MyChart Message (if you have MyChart) OR . A paper copy in the mail If you have any lab test that is abnormal or we need to change your treatment, we will call you to review the results.  Testing/Procedures: NONE  Follow-Up: At [facility], you and your health needs are our priority.  As part of our continuing mission to provide you with exceptional heart care, we have created designated Provider Care Teams.  These Care Teams include your primary Cardiologist (physician) and Advanced Practice Providers (APPs -  Physician Assistants and Nurse Practitioners) who all work together to provide you with the care you need, when you need it.  Your next appointment:   2 month(s)  The format for your next appointment:   In Person  Provider:   Oswaldo Milian, MD  Other Instructions Take BP daily at home and write it down-call office in 2 weeks with readings

## 2019-03-05 NOTE — Progress Notes (Addendum)
I have reviewed and agree.

## 2019-03-09 ENCOUNTER — Ambulatory Visit: Payer: 59 | Admitting: Endocrinology

## 2019-03-14 ENCOUNTER — Other Ambulatory Visit: Payer: Self-pay

## 2019-03-16 ENCOUNTER — Other Ambulatory Visit: Payer: Self-pay

## 2019-03-16 ENCOUNTER — Encounter: Payer: Self-pay | Admitting: Endocrinology

## 2019-03-16 ENCOUNTER — Ambulatory Visit (INDEPENDENT_AMBULATORY_CARE_PROVIDER_SITE_OTHER): Payer: 59 | Admitting: Endocrinology

## 2019-03-16 VITALS — BP 130/70 | HR 76 | Ht 68.0 in | Wt 208.2 lb

## 2019-03-16 DIAGNOSIS — E108 Type 1 diabetes mellitus with unspecified complications: Secondary | ICD-10-CM

## 2019-03-16 LAB — POCT GLYCOSYLATED HEMOGLOBIN (HGB A1C): Hemoglobin A1C: 8 % — AB (ref 4.0–5.6)

## 2019-03-16 MED ORDER — SILDENAFIL CITRATE 100 MG PO TABS: 100.0000 mg | ORAL_TABLET | Freq: Every day | ORAL | 5 refills | Status: AC | PRN

## 2019-03-16 NOTE — Progress Notes (Signed)
Subjective:    Patient ID: Donald Lopez, male    DOB: 07/23/1955, 64 y.o.   MRN: [id number]  HPI Pt returns for f/u of diabetes mellitus:  DM type: 1 Dx'ed: 3546 Complications: CAD and PDR.  Therapy: insulin since soon after dx.   DKA: never.  Severe hypoglycemia: never.   Pancreatitis: never.   Other: he has declined pump and continuous glucose monitor; he takes multiple daily injections, but emphasizing basal has helped A1c.   Interval history: no cbg record, but states cbg's still vary widely.  He has mild hypoglycemia approx QOD.  This usually happens any time of day, but most commonly before lunch.  It is in general highest at HS.  He takes insulins as rx'ed.  Past Medical History:  Diagnosis Date  . Cancer [facility])    prostate cancer  . DIABETES MELLITUS, TYPE I 11/17/2006  . DIABETIC  RETINOPATHY 11/17/2006  . 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 HYPERLIPIDEMIA 11/17/2006  . POLYP, GALLBLADDER 11/17/2006  . TOBACCO USE, QUIT 11/17/2006  . VARICOSE VEINS, LOWER EXTREMITIES 11/17/2006    Past Surgical History:  Procedure Laterality Date  . KNEE ARTHROSCOPY     Left  . PROSTATE SURGERY  October 2016   [facility]/ Dr. Georgetta Haber   . SHOULDER SURGERY     Left  . VASECTOMY    . VITRECTOMY  2009   Right    Social History   Socioeconomic History  . Marital status: Married    Spouse name: Presenter, broadcasting  . Number of children: 3  . Years of education: Not on file  . Highest education level: Not on file  Occupational History  . Occupation: Airline pilot  Tobacco Use  . Smoking status: Never Smoker  . Smokeless tobacco: Never Used  Substance and Sexual Activity  . Alcohol use: Yes    Comment: 5-6/week  . Drug use: No  . Sexual activity: Not on file  Other Topics Concern  . Not on file  Social History Narrative  . Not on file   Social Determinants of Health   Financial Resource Strain:   . Difficulty of Paying Living Expenses: Not on file  Food  Insecurity:   . Worried About Charity fundraiser in the Last Year: Not on file  . Ran Out of Food in the Last Year: Not on file  Transportation Needs:   . Lack of Transportation (Medical): Not on file  . Lack of Transportation (Non-Medical): Not on file  Physical Activity:   . Days of Exercise per Week: Not on file  . Minutes of Exercise per Session: Not on file  Stress:   . Feeling of Stress : Not on file  Social Connections:   . Frequency of Communication with Friends and Family: Not on file  . Frequency of Social Gatherings with Friends and Family: Not on file  . Attends Religious Services: Not on file  . Active Member of Clubs or Organizations: Not on file  . Attends Archivist Meetings: Not on file  . Marital Status: Not on file  Intimate Partner Violence:   . Fear of Current or Ex-Partner: Not on file  . Emotionally Abused: Not on file  . Physically Abused: Not on file  . Sexually Abused: Not on file    Current Outpatient Medications on File Prior to Visit  Medication Sig Dispense Refill  . Accu-Chek Softclix Lancets lancets Use to check blood sugar 5 times per day. Dx Code: E10.8 100  each 12  . Alcohol Swabs (B-D SINGLE USE SWABS REGULAR) PADS USE AS DIRECTED 100 each 0  . aspirin EC 81 MG tablet Take 81 mg by mouth daily.    Marland Kitchen atorvastatin (LIPITOR) 40 MG tablet Take 1 tablet (40 mg total) by mouth daily. 90 tablet 3  . Blood Glucose Monitoring Suppl (ACCU-CHEK AVIVA) device Use to monitor glucose levels 5 times daily; E10.8 1 each 0  . glucose blood (ACCU-CHEK AVIVA PLUS) test strip Use to check blood sugar 5 times per day. Dx Code: E10.8 500 strip 3  . insulin aspart (NOVOLOG) 100 UNIT/ML injection Inject 2-5 Units into the skin 3 (three) times daily with meals. 10 mL 11  . insulin glargine (LANTUS) 100 UNIT/ML injection Inject 0.4 mLs (40 Units total) into the skin every morning. 20 mL 11  . Insulin Syringe-Needle U-100 (B-D INS SYR ULTRAFINE .3CC/30G) 30G X  1/2" 0.3 ML MISC Use as directed Four times a day Dx code: E10.8 400 each 2  . lisinopril (ZESTRIL) 10 MG tablet Take 1 tablet (10 mg total) by mouth daily. 90 tablet 3  . Vitamin D, Ergocalciferol, (DRISDOL) 1.25 MG (50000 UT) CAPS capsule Take 1 capsule (50,000 Units total) by mouth every 7 (seven) days. 12 capsule 0   No current facility-administered medications on file prior to visit.    Allergies  Allergen Reactions  . Penicillins     REACTION: unspecified reaction    Family History  Problem Relation Age of Onset  . Cancer Father   . Hypertension Mother   . Varicose Veins Maternal Aunt   . Varicose Veins Maternal Grandmother     BP 130/70 (BP Location: Left Arm, Patient Position: Sitting, Cuff Size: Large)   Pulse 76   Ht 5\' 8"  (1.727 m)   Wt 208 lb 3.2 oz (94.4 kg)   SpO2 96%   BMI 31.66 kg/m    Review of Systems Denies LOC    Objective:   Physical Exam VITAL SIGNS:  See vs page GENERAL: no distress Pulses: dorsalis pedis intact bilat.   MSK: no deformity of the feet CV: 1+ left leg edema (trace on the right), and bilat vv's Skin:  no ulcer on the feet.  normal color and temp on the feet. Neuro: sensation is intact to touch on the feet  Lab Results  Component Value Date   HGBA1C 8.0 (A) 03/16/2019   Lab Results  Component Value Date   CREATININE 0.80 11/17/2018   BUN 15 11/17/2018   NA 141 11/17/2018   K 4.2 11/17/2018   CL 105 11/17/2018   CO2 30 11/17/2018       Assessment & Plan:  Type 1 DM, with CAD: he needs increased rx.  Hypoglycemia: this limits aggressiveness of glycemic control   Patient Instructions  Please continue the the same lantus.   Please continue the novolog, 2-5 units 3 times a day (just before each meal).  However, try to go on the low end of this at breakfast, and the high end at supper.  check your blood sugar 5 times a day.  vary the time of day when you check, between before the 3 meals, and at bedtime.  also check if  you have symptoms of your blood sugar being too high or too low.  please keep a record of the readings and bring it to your next appointment here (or you can bring the meter itself).  You can write it on any piece of paper.  please call us sooner if your blood sugar goes below 70, or if you have a lot of readings over 200. If you are going to be active, and can anticipate it, skip the prior dose of novolog.  If not, eat a light snack with it.    Please come back for a follow-up appointment in 3 months.

## 2019-03-16 NOTE — Patient Instructions (Addendum)
Please continue the the same lantus.   Please continue the novolog, 2-5 units 3 times a day (just before each meal).  However, try to go on the low end of this at breakfast, and the high end at supper.  check your blood sugar 5 times a day.  vary the time of day when you check, between before the 3 meals, and at bedtime.  also check if you have symptoms of your blood sugar being too high or too low.  please keep a record of the readings and bring it to your next appointment here (or you can bring the meter itself).  You can write it on any piece of paper.  please call us sooner if your blood sugar goes below 70, or if you have a lot of readings over 200. If you are going to be active, and can anticipate it, skip the prior dose of novolog.  If not, eat a light snack with it.    Please come back for a follow-up appointment in 3 months.

## 2019-03-17 NOTE — Addendum Note (Signed)
Addended by: Renato Shin on: 03/17/2019 05:32 PM   Modules accepted: Level of Service

## 2019-03-19 ENCOUNTER — Encounter: Payer: Self-pay | Admitting: Internal Medicine

## 2019-03-21 LAB — BASIC METABOLIC PANEL
BUN/Creatinine Ratio: 12 (ref 10–24)
BUN: 10 mg/dL (ref 8–27)
CO2: 23 mmol/L (ref 20–29)
Calcium: 9.5 mg/dL (ref 8.6–10.2)
Chloride: 103 mmol/L (ref 96–106)
Creatinine, Ser: 0.85 mg/dL (ref 0.76–1.27)
GFR calc Af Amer: 109 mL/min/{1.73_m2} (ref >59)
GFR calc non Af Amer: 94 mL/min/{1.73_m2} (ref >59)
Glucose: 78 mg/dL (ref 65–99)
Potassium: 4.7 mmol/L (ref 3.5–5.2)
Sodium: 143 mmol/L (ref 134–144)

## 2019-03-24 ENCOUNTER — Ambulatory Visit: Payer: 59 | Attending: Internal Medicine

## 2019-03-24 DIAGNOSIS — Z23 Encounter for immunization: Secondary | ICD-10-CM

## 2019-03-24 NOTE — Progress Notes (Signed)
   Covid-19 Vaccination Clinic  Name:  DARVIN PYNN    MRN: [id number] DOB: 1955-12-22  03/24/2019  Mr. Gayhart was observed post Covid-19 immunization for 15 minutes without incident. He was provided with Vaccine Information Sheet and instruction to access the V-Safe system.   Mr. Thelen was instructed to call 911 with any severe reactions post vaccine: Marland Kitchen Difficulty breathing  . Swelling of face and throat  . A fast heartbeat  . A bad rash all over body  . Dizziness and weakness   Immunizations Administered    Name Date Dose VIS Date Route   Pfizer COVID-19 Vaccine 03/24/2019  2:58 PM 0.3 mL 12/29/2018 Intramuscular   Manufacturer: [place]   Lot: [id number]   [place]: [id number]

## 2019-04-02 ENCOUNTER — Other Ambulatory Visit: Payer: Self-pay

## 2019-04-02 ENCOUNTER — Ambulatory Visit (INDEPENDENT_AMBULATORY_CARE_PROVIDER_SITE_OTHER): Payer: 59 | Admitting: *Deleted

## 2019-04-02 DIAGNOSIS — E538 Deficiency of other specified B group vitamins: Secondary | ICD-10-CM | POA: Diagnosis not present

## 2019-04-02 MED ORDER — CYANOCOBALAMIN 1000 MCG/ML IJ SOLN
1000.0000 ug | Freq: Once | INTRAMUSCULAR | Status: AC
  Administered 2019-04-02: 1000 ug via INTRAMUSCULAR

## 2019-04-02 NOTE — Progress Notes (Addendum)
Pls cosign for B12 inj since PCP is out of the office..Donald Lopez    b12 Injection given.   Binnie Rail, MD

## 2019-04-14 ENCOUNTER — Ambulatory Visit: Payer: 59 | Attending: Internal Medicine

## 2019-04-14 DIAGNOSIS — Z23 Encounter for immunization: Secondary | ICD-10-CM

## 2019-04-14 NOTE — Progress Notes (Signed)
   Covid-19 Vaccination Clinic  Name:  Donald Lopez    MRN: [id number] DOB: Mar 08, 1955  04/14/2019  [name] was observed post Covid-19 immunization for 15 minutes without incident. He was provided with Vaccine Information Sheet and instruction to access the V-Safe system.   [name] was instructed to call 911 with any severe reactions post vaccine: Marland Kitchen Difficulty breathing  . Swelling of face and throat  . A fast heartbeat  . A bad rash all over body  . Dizziness and weakness   Immunizations Administered    Name Date Dose VIS Date Route   Pfizer COVID-19 Vaccine 04/14/2019  9:06 AM 0.3 mL 12/29/2018 Intramuscular   Manufacturer: [place]   Lot: [id number]   [place]: [id number]

## 2019-04-24 ENCOUNTER — Ambulatory Visit: Payer: 59

## 2019-05-01 NOTE — Progress Notes (Signed)
Cardiology Office Note:    Date:  05/03/2019   ID:  Donald Lopez, DOB 09/02/1955, MRN [id number]  PCP:  Biagio Borg, MD  Cardiologist:  No primary care provider on file.  Electrophysiologist:  None   Referring MD: Biagio Borg, MD   Chief Complaint  Patient presents with  . Coronary Artery Disease    History of Present Illness:    Donald Lopez is a 64 y.o. male with a hx of type 1 diabetes, prostate cancer, hyperlipidemia, tobacco use who presents for follow-up.  He was referred by Dr. Jenny Reichmann for an evaluation of elevated calcium score, initially seen on 03/05/2019.  Patient underwent a calcium score on 02/07/2019, which was 1397 (97th percentile for age/gender).  Given the significant elevation in calcium score, he underwent a Lexiscan Myoview on 02/28/2019, which showed no evidence of ischemia, EF 61%.  At initial clinic visit, he denied any chest pain or dyspnea.  Reports that he exercises 3-4 times per week, walk 2 to 3 miles carrying 2 pound weights and also does the bicycle or elliptical at the [facility] for 30 minutes.  Denies any exertional symptoms.  Started statin in October 2020.  Never smoked.  Father had MI in 24s.  Mother had CVA in 16s.  Since last clinic visit, he reports that he is going to the [facility] every other day.  He will walk for 30 to 40 minutes, carrying 2 pound weights.  He walks his dog every morning and evening as well.  At [facility] he will do the elliptical, treadmill, and lift weights.  He denies any chest pain or dyspnea.  Reports BP has been 120s to 140s over 70s to 90s at home.    Past Medical History:  Diagnosis Date  . Cancer [facility])    prostate cancer  . DIABETES MELLITUS, TYPE I 11/17/2006  . DIABETIC  RETINOPATHY 11/17/2006  . 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 HYPERLIPIDEMIA 11/17/2006  . POLYP, GALLBLADDER 11/17/2006  . TOBACCO USE, QUIT 11/17/2006  . VARICOSE VEINS, LOWER EXTREMITIES 11/17/2006    Past Surgical History:  Procedure Laterality Date  . KNEE  ARTHROSCOPY     Left  . PROSTATE SURGERY  October 2016   [facility]/ Dr. Georgetta Haber   . SHOULDER SURGERY     Left  . VASECTOMY    . VITRECTOMY  2009   Right    Current Medications: Current Meds  Medication Sig  . Accu-Chek Softclix Lancets lancets Use to check blood sugar 5 times per day. Dx Code: E10.8  . Alcohol Swabs (B-D SINGLE USE SWABS REGULAR) PADS USE AS DIRECTED  . aspirin EC 81 MG tablet Take 81 mg by mouth daily.  Marland Kitchen atorvastatin (LIPITOR) 40 MG tablet Take 1 tablet (40 mg total) by mouth daily.  . Blood Glucose Monitoring Suppl (ACCU-CHEK AVIVA) device Use to monitor glucose levels 5 times daily; E10.8  . Cyanocobalamin (B-12 COMPLIANCE INJECTION IJ) Inject as directed every 30 (thirty) days.  Marland Kitchen glucose blood (ACCU-CHEK AVIVA PLUS) test strip Use to check blood sugar 5 times per day. Dx Code: E10.8  . insulin aspart (NOVOLOG) 100 UNIT/ML injection Inject 2-5 Units into the skin 3 (three) times daily with meals.  . insulin glargine (LANTUS) 100 UNIT/ML injection Inject 0.4 mLs (40 Units total) into the skin every morning.  . Insulin Syringe-Needle U-100 (B-D INS SYR ULTRAFINE .3CC/30G) 30G X 1/2" 0.3 ML MISC Use as directed Four times a day Dx code: E10.8  . lisinopril (  ZESTRIL) 20 MG tablet Take 1 tablet (20 mg total) by mouth daily.  . sildenafil (VIAGRA) 100 MG tablet Take 1 tablet (100 mg total) by mouth daily as needed for erectile dysfunction.  . Vitamin D, Ergocalciferol, (DRISDOL) 1.25 MG (50000 UT) CAPS capsule Take 1 capsule (50,000 Units total) by mouth every 7 (seven) days.  . [DISCONTINUED] lisinopril (ZESTRIL) 10 MG tablet Take 1 tablet (10 mg total) by mouth daily.     Allergies:   Penicillins   Social History   Socioeconomic History  . Marital status: Married    Spouse name: Presenter, broadcasting  . Number of children: 3  . Years of education: Not on file  . Highest education level: Not on file  Occupational History  . Occupation: Social worker  Tobacco Use  . Smoking status: Never Smoker  . Smokeless tobacco: Never Used  Substance and Sexual Activity  . Alcohol use: Yes    Comment: 5-6/week  . Drug use: No  . Sexual activity: Not on file  Other Topics Concern  . Not on file  Social History Narrative  . Not on file   Social Determinants of Health   Financial Resource Strain:   . Difficulty of Paying Living Expenses:   Food Insecurity:   . Worried About Charity fundraiser in the Last Year:   . Arboriculturist in the Last Year:   Transportation Needs:   . Film/video editor (Medical):   Marland Kitchen Lack of Transportation (Non-Medical):   Physical Activity:   . Days of Exercise per Week:   . Minutes of Exercise per Session:   Stress:   . Feeling of Stress :   Social Connections:   . Frequency of Communication with Friends and Family:   . Frequency of Social Gatherings with Friends and Family:   . Attends Religious Services:   . Active Member of Clubs or Organizations:   . Attends Archivist Meetings:   Marland Kitchen Marital Status:      Family History: The patient's family history includes Cancer in his father; Hypertension in his mother; Varicose Veins in his maternal aunt and maternal grandmother.  ROS:   Please see the history of present illness.     All other systems reviewed and are negative.  EKGs/Labs/Other Studies Reviewed:    The following studies were reviewed today:   EKG:  EKG is  ordered today.  The ekg ordered today demonstrates normal sinus rhythm, rate 72, no ST/T abnormalities  Recent Labs: 11/17/2018: ALT 15; Hemoglobin 15.2; Platelets 247.0; TSH 1.50 03/20/2019: BUN 10; Creatinine, Ser 0.85; Potassium 4.7; Sodium 143  Recent Lipid Panel    Component Value Date/Time   CHOL 180 11/17/2018 0929   TRIG 61.0 11/17/2018 0929   HDL 49.40 11/17/2018 0929   CHOLHDL 4 11/17/2018 0929   VLDL 12.2 11/17/2018 0929   LDLCALC 119 (H) 11/17/2018 0929   LDLDIRECT 123.2 01/22/2013 1101     Calcium score 02/07/19: Coronary calcium score of 1397. This was 97th percentile for age and sex matched control. Recommend aggressive risk factor modification. Consider noninvasive ischemic testing.  Lexiscan Myoview 02/28/2019:  The left ventricular ejection fraction is normal (55-65%).  Nuclear stress EF: 61%.  There was no ST segment deviation noted during stress.  The study is normal. No evidence of ischemia.  This is a low risk study.  No prior study for comparison.  Physical Exam:    VS:  BP 140/68   Pulse 73  Temp (!) 96.8 F (36 C)   Ht 5\' 8"  (1.727 m)   Wt 201 lb 12.8 oz (91.5 kg)   SpO2 98%   BMI 30.68 kg/m     Wt Readings from Last 3 Encounters:  05/03/19 201 lb 12.8 oz (91.5 kg)  03/16/19 208 lb 3.2 oz (94.4 kg)  03/05/19 213 lb 3.2 oz (96.7 kg)     GEN:  Well nourished, well developed in no acute distress HEENT: Normal NECK: No JVD CARDIAC: RRR, no murmurs, rubs, gallops RESPIRATORY:  Clear to auscultation without rales, wheezing or rhonchi  ABDOMEN: Soft, non-tender, non-distended MUSCULOSKELETAL:  No edema; No deformity  SKIN: Warm and dry NEUROLOGIC:  Alert and oriented x 3 PSYCHIATRIC:  Normal affect   ASSESSMENT:    1. Coronary artery disease involving native coronary artery of native heart without angina pectoris   2. Hypertension, unspecified type   3. Hyperlipidemia, unspecified hyperlipidemia type    PLAN:    Coronary artery disease: calcium score on 02/07/2019 was 1397 (97th percentile for age/gender).  Given the significant elevation in calcium score, he underwent a Lexiscan Myoview on 02/28/2019, which showed no evidence of ischemia, EF 61%. -Last LDL 119 on 11/17/2018.  Increased atorvastatin from 10 mg to 40 mg daily on 03/05/19.  We will recheck lipid panel and increase statin as needed for goal LDL less than 70.    Hypertension: Started lisinopril 10 mg daily on 03/05/2019.  BP remains elevated, will increase to 20 mg daily  and check BMP in 1 week  Hyperlipidemia: LDL 119 on 11/17/2018, atorvastatin increased to 40 mg daily on 03/05/2019 as above.  Will recheck lipid panel  Type 1 diabetes: On insulin, follows with endocrinology.  RTC in 6 months   Medication Adjustments/Labs and Tests Ordered: Current medicines are reviewed at length with the patient today.  Concerns regarding medicines are outlined above.  Orders Placed This Encounter  Procedures  . Basic metabolic panel  . Lipid panel   Meds ordered this encounter  Medications  . lisinopril (ZESTRIL) 20 MG tablet    Sig: Take 1 tablet (20 mg total) by mouth daily.    Dispense:  90 tablet    Refill:  3    Dose increase    Patient Instructions  Medication Instructions:  INCREASE lisinopril to 20 mg daily  *If you need a refill on your cardiac medications before your next appointment, please call your pharmacy*   Lab Work: In [place] (Lipid, BMET)  If you have labs (blood work) drawn today and your tests are completely normal, you will receive your results only by: Marland Kitchen MyChart Message (if you have MyChart) OR . A paper copy in the mail If you have any lab test that is abnormal or we need to change your treatment, we will call you to review the results.  Follow-Up: At [facility], you and your health needs are our priority.  As part of our continuing mission to provide you with exceptional heart care, we have created designated Provider Care Teams.  These Care Teams include your primary Cardiologist (physician) and Advanced Practice Providers (APPs -  Physician Assistants and Nurse Practitioners) who all work together to provide you with the care you need, when you need it.  We recommend signing up for the patient portal called "MyChart".  Sign up information is provided on this After Visit Summary.  MyChart is used to connect with patients for Virtual Visits (Telemedicine).  Patients are able  to view lab/test results, encounter  notes, upcoming appointments, etc.  Non-urgent messages can be sent to your provider as well.   To learn more about what you can do with MyChart, go to [email or web address].    Your next appointment:   6 month(s)  The format for your next appointment:   In Person  Provider:   Oswaldo Milian, MD         Signed, Donato Heinz, MD  05/03/2019 2:06 PM    [place]

## 2019-05-03 ENCOUNTER — Other Ambulatory Visit: Payer: Self-pay

## 2019-05-03 ENCOUNTER — Encounter: Payer: Self-pay | Admitting: Cardiology

## 2019-05-03 ENCOUNTER — Ambulatory Visit (INDEPENDENT_AMBULATORY_CARE_PROVIDER_SITE_OTHER): Payer: 59 | Admitting: Cardiology

## 2019-05-03 VITALS — BP 140/68 | HR 73 | Temp 96.8°F | Ht 68.0 in | Wt 201.8 lb

## 2019-05-03 DIAGNOSIS — E785 Hyperlipidemia, unspecified: Secondary | ICD-10-CM | POA: Diagnosis not present

## 2019-05-03 DIAGNOSIS — I2510 Atherosclerotic heart disease of native coronary artery without angina pectoris: Secondary | ICD-10-CM

## 2019-05-03 DIAGNOSIS — I10 Essential (primary) hypertension: Secondary | ICD-10-CM | POA: Diagnosis not present

## 2019-05-03 MED ORDER — LISINOPRIL 20 MG PO TABS: 20.0000 mg | ORAL_TABLET | Freq: Every day | ORAL | 3 refills | Status: DC

## 2019-05-03 NOTE — Patient Instructions (Signed)
Medication Instructions:  INCREASE lisinopril to 20 mg daily  *If you need a refill on your cardiac medications before your next appointment, please call your pharmacy*   Lab Work: In [place] (Lipid, BMET)  If you have labs (blood work) drawn today and your tests are completely normal, you will receive your results only by: Marland Kitchen MyChart Message (if you have MyChart) OR . A paper copy in the mail If you have any lab test that is abnormal or we need to change your treatment, we will call you to review the results.  Follow-Up: At [facility], you and your health needs are our priority.  As part of our continuing mission to provide you with exceptional heart care, we have created designated Provider Care Teams.  These Care Teams include your primary Cardiologist (physician) and Advanced Practice Providers (APPs -  Physician Assistants and Nurse Practitioners) who all work together to provide you with the care you need, when you need it.  We recommend signing up for the patient portal called "MyChart".  Sign up information is provided on this After Visit Summary.  MyChart is used to connect with patients for Virtual Visits (Telemedicine).  Patients are able to view lab/test results, encounter notes, upcoming appointments, etc.  Non-urgent messages can be sent to your provider as well.   To learn more about what you can do with MyChart, go to [email or web address].    Your next appointment:   6 month(s)  The format for your next appointment:   In Person  Provider:   Oswaldo Milian, MD

## 2019-05-07 ENCOUNTER — Ambulatory Visit: Payer: 59

## 2019-05-10 ENCOUNTER — Ambulatory Visit (INDEPENDENT_AMBULATORY_CARE_PROVIDER_SITE_OTHER): Payer: 59 | Admitting: *Deleted

## 2019-05-10 ENCOUNTER — Other Ambulatory Visit: Payer: Self-pay

## 2019-05-10 DIAGNOSIS — E538 Deficiency of other specified B group vitamins: Secondary | ICD-10-CM | POA: Diagnosis not present

## 2019-05-10 MED ORDER — CYANOCOBALAMIN 1000 MCG/ML IJ SOLN
1000.0000 ug | Freq: Once | INTRAMUSCULAR | Status: AC
  Administered 2019-05-10: 1000 ug via INTRAMUSCULAR

## 2019-05-10 NOTE — Progress Notes (Signed)
Pls cosign for B12 inj../lmb  

## 2019-06-15 ENCOUNTER — Ambulatory Visit: Payer: 59 | Admitting: Endocrinology

## 2019-07-17 ENCOUNTER — Ambulatory Visit: Payer: 59 | Admitting: Endocrinology

## 2019-08-24 ENCOUNTER — Ambulatory Visit: Payer: 59 | Admitting: Endocrinology

## 2019-09-03 ENCOUNTER — Ambulatory Visit: Payer: 59 | Admitting: Endocrinology

## 2019-09-04 ENCOUNTER — Telehealth: Payer: Self-pay | Admitting: Cardiology

## 2019-09-04 NOTE — Telephone Encounter (Signed)
LVM for patient to return call to get follow up scheduled with Gardiner Rhyme from recall list

## 2019-09-06 ENCOUNTER — Other Ambulatory Visit: Payer: Self-pay | Admitting: Endocrinology

## 2019-09-06 DIAGNOSIS — E108 Type 1 diabetes mellitus with unspecified complications: Secondary | ICD-10-CM

## 2019-09-25 ENCOUNTER — Ambulatory Visit: Payer: 59 | Attending: Internal Medicine

## 2019-09-25 DIAGNOSIS — Z23 Encounter for immunization: Secondary | ICD-10-CM

## 2019-09-25 NOTE — Progress Notes (Signed)
   Covid-19 Vaccination Clinic  Name:  Donald Lopez    MRN: [id number] DOB: 01-18-1956  09/25/2019  Mr. Donald Lopez was observed post Covid-19 immunization for 15 minutes without incident. He was provided with Vaccine Information Sheet and instruction to access the V-Safe system.   Mr. Donald Lopez was instructed to call 911 with any severe reactions post vaccine: Marland Kitchen Difficulty breathing  . Swelling of face and throat  . A fast heartbeat  . A bad rash all over body  . Dizziness and weakness

## 2019-09-26 ENCOUNTER — Ambulatory Visit: Payer: 59 | Admitting: Endocrinology

## 2019-10-05 ENCOUNTER — Other Ambulatory Visit: Payer: Self-pay

## 2019-10-05 ENCOUNTER — Ambulatory Visit (INDEPENDENT_AMBULATORY_CARE_PROVIDER_SITE_OTHER): Payer: 59 | Admitting: Endocrinology

## 2019-10-05 VITALS — BP 148/72 | HR 76 | Ht 68.0 in | Wt 206.0 lb

## 2019-10-05 DIAGNOSIS — R229 Localized swelling, mass and lump, unspecified: Secondary | ICD-10-CM

## 2019-10-05 DIAGNOSIS — E1159 Type 2 diabetes mellitus with other circulatory complications: Secondary | ICD-10-CM | POA: Diagnosis not present

## 2019-10-05 DIAGNOSIS — E108 Type 1 diabetes mellitus with unspecified complications: Secondary | ICD-10-CM

## 2019-10-05 LAB — POCT GLYCOSYLATED HEMOGLOBIN (HGB A1C): Hemoglobin A1C: 8.4 % — AB (ref 4.0–5.6)

## 2019-10-05 MED ORDER — INSULIN ASPART 100 UNIT/ML ~~LOC~~ SOLN: 1.0000 [IU] | Freq: Three times a day (TID) | SUBCUTANEOUS | 11 refills | Status: DC

## 2019-10-05 NOTE — Progress Notes (Signed)
Subjective:    Patient ID: Donald Lopez, male    DOB: 07/23/1955, 64 y.o.   MRN: [id number]  HPI Pt returns for f/u of diabetes mellitus:  DM type: 1 Dx'ed: 3546 Complications: CAD and PDR.  Therapy: insulin since soon after dx.   DKA: never.  Severe hypoglycemia: never.   Pancreatitis: never.   Other: he has declined pump and continuous glucose monitor; he takes multiple daily injections, but emphasizing basal has helped A1c.   Interval history: no cbg record, but states cbg's still vary widely.  He has mild hypoglycemia approx QOD.  This usually happens any time of day, but most commonly before lunch.  It is in general highest at HS.  He takes insulins as rx'ed.  Past Medical History:  Diagnosis Date  . Cancer [facility])    prostate cancer  . DIABETES MELLITUS, TYPE I 11/17/2006  . DIABETIC  RETINOPATHY 11/17/2006  . 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 HYPERLIPIDEMIA 11/17/2006  . POLYP, GALLBLADDER 11/17/2006  . TOBACCO USE, QUIT 11/17/2006  . VARICOSE VEINS, LOWER EXTREMITIES 11/17/2006    Past Surgical History:  Procedure Laterality Date  . KNEE ARTHROSCOPY     Left  . PROSTATE SURGERY  October 2016   [facility]/ Dr. Georgetta Haber   . SHOULDER SURGERY     Left  . VASECTOMY    . VITRECTOMY  2009   Right    Social History   Socioeconomic History  . Marital status: Married    Spouse name: Presenter, broadcasting  . Number of children: 3  . Years of education: Not on file  . Highest education level: Not on file  Occupational History  . Occupation: Airline pilot  Tobacco Use  . Smoking status: Never Smoker  . Smokeless tobacco: Never Used  Substance and Sexual Activity  . Alcohol use: Yes    Comment: 5-6/week  . Drug use: No  . Sexual activity: Not on file  Other Topics Concern  . Not on file  Social History Narrative  . Not on file   Social Determinants of Health   Financial Resource Strain:   . Difficulty of Paying Living Expenses: Not on file  Food  Insecurity:   . Worried About Charity fundraiser in the Last Year: Not on file  . Ran Out of Food in the Last Year: Not on file  Transportation Needs:   . Lack of Transportation (Medical): Not on file  . Lack of Transportation (Non-Medical): Not on file  Physical Activity:   . Days of Exercise per Week: Not on file  . Minutes of Exercise per Session: Not on file  Stress:   . Feeling of Stress : Not on file  Social Connections:   . Frequency of Communication with Friends and Family: Not on file  . Frequency of Social Gatherings with Friends and Family: Not on file  . Attends Religious Services: Not on file  . Active Member of Clubs or Organizations: Not on file  . Attends Archivist Meetings: Not on file  . Marital Status: Not on file  Intimate Partner Violence:   . Fear of Current or Ex-Partner: Not on file  . Emotionally Abused: Not on file  . Physically Abused: Not on file  . Sexually Abused: Not on file    Current Outpatient Medications on File Prior to Visit  Medication Sig Dispense Refill  . Accu-Chek Softclix Lancets lancets Use to check blood sugar 5 times per day. Dx Code: E10.8 100  each 12  . Alcohol Swabs (B-D SINGLE USE SWABS REGULAR) PADS USE AS DIRECTED 100 each 0  . aspirin EC 81 MG tablet Take 81 mg by mouth daily.    Marland Kitchen atorvastatin (LIPITOR) 40 MG tablet Take 1 tablet (40 mg total) by mouth daily. 90 tablet 3  . Blood Glucose Monitoring Suppl (ACCU-CHEK AVIVA) device Use to monitor glucose levels 5 times daily; E10.8 1 each 0  . Cyanocobalamin (B-12 COMPLIANCE INJECTION IJ) Inject as directed every 30 (thirty) days.    Marland Kitchen glucose blood (ACCU-CHEK AVIVA PLUS) test strip 1 each by Other route 5 (five) times daily. E11.9 450 strip 0  . insulin glargine (LANTUS) 100 UNIT/ML injection Inject 0.4 mLs (40 Units total) into the skin every morning. 20 mL 11  . Insulin Syringe-Needle U-100 (B-D INS SYR ULTRAFINE .3CC/30G) 30G X 1/2" 0.3 ML MISC Use as directed Four  times a day Dx code: E10.8 400 each 2  . lisinopril (ZESTRIL) 20 MG tablet Take 1 tablet (20 mg total) by mouth daily. 90 tablet 3  . sildenafil (VIAGRA) 100 MG tablet Take 1 tablet (100 mg total) by mouth daily as needed for erectile dysfunction. 30 tablet 5  . Vitamin D, Ergocalciferol, (DRISDOL) 1.25 MG (50000 UT) CAPS capsule Take 1 capsule (50,000 Units total) by mouth every 7 (seven) days. 12 capsule 0   No current facility-administered medications on file prior to visit.    Allergies  Allergen Reactions  . Penicillins     REACTION: unspecified reaction    Family History  Problem Relation Age of Onset  . Cancer Father   . Hypertension Mother   . Varicose Veins Maternal Aunt   . Varicose Veins Maternal Grandmother     BP (!) 148/72   Pulse 76   Ht 5\' 8"  (1.727 m)   Wt 206 lb (93.4 kg)   SpO2 93%   BMI 31.32 kg/m    Review of Systems Denies LOC    Objective:   Physical Exam VITAL SIGNS:  See vs page.   GENERAL: no distress.   Pulses: dorsalis pedis intact bilat.   MSK: no deformity of the feet.   CV: 1+ bilat leg edema, and bilat vv's.   Skin:  no ulcer on the feet.  normal color and temp on the feet.  Few mm nodule at the left temporal area.  Neuro: sensation is intact to touch on the feet.    A1c=8.4%     Assessment & Plan:  Type 1 DM, with CAD: uncontrolled Hypoglycemia, due to insulin: this limits aggressiveness of glycemic control   Patient Instructions  Please continue the the same lantus.   Please take the novolog,1-4 units with breakfast and lunch, and 3-6 units with supper.   Please see a specialist.  you will receive a phone call, about a day and time for an appointment.   check your blood sugar 5 times a day.  vary the time of day when you check, between before the 3 meals, and at bedtime.  also check if you have symptoms of your blood sugar being too high or too low.  please keep a record of the readings and bring it to your next appointment  here (or you can bring the meter itself).  You can write it on any piece of paper.  please call us sooner if your blood sugar goes below 70, or if you have a lot of readings over 200. If you are going to be active,  and can anticipate it, skip the prior dose of novolog.  If not, eat a light snack with it.    Please come back for a follow-up appointment in 4 months.

## 2019-10-05 NOTE — Patient Instructions (Addendum)
Please continue the the same lantus.   Please take the novolog,1-4 units with breakfast and lunch, and 3-6 units with supper.   Please see a specialist.  you will receive a phone call, about a day and time for an appointment.   check your blood sugar 5 times a day.  vary the time of day when you check, between before the 3 meals, and at bedtime.  also check if you have symptoms of your blood sugar being too high or too low.  please keep a record of the readings and bring it to your next appointment here (or you can bring the meter itself).  You can write it on any piece of paper.  please call us sooner if your blood sugar goes below 70, or if you have a lot of readings over 200. If you are going to be active, and can anticipate it, skip the prior dose of novolog.  If not, eat a light snack with it.    Please come back for a follow-up appointment in 4 months.

## 2019-10-07 ENCOUNTER — Other Ambulatory Visit: Payer: Self-pay | Admitting: Endocrinology

## 2019-10-07 DIAGNOSIS — E108 Type 1 diabetes mellitus with unspecified complications: Secondary | ICD-10-CM

## 2019-11-05 NOTE — Progress Notes (Signed)
Cardiology Office Note:    Date:  11/09/2019   ID:  Donald Lopez, DOB 1955-05-17, MRN [id number]  PCP:  Biagio Borg, MD  Cardiologist:  No primary care provider on file.  Electrophysiologist:  None   Referring MD: Biagio Borg, MD   No chief complaint on file.   History of Present Illness:    Donald Lopez is a 64 y.o. male with a hx of type 1 diabetes, prostate cancer, hyperlipidemia, tobacco use who presents for follow-up.  He was referred by Dr. Jenny Reichmann for an evaluation of elevated calcium score, initially seen on 03/05/2019.  Patient underwent a calcium score on 02/07/2019, which was 1397 (97th percentile for age/gender).  Given the significant elevation in calcium score, he underwent a Lexiscan Myoview on 02/28/2019, which showed no evidence of ischemia, EF 61%.  At initial clinic visit, he denied any chest pain or dyspnea.  Reports that he exercises 3-4 times per week, walk 2 to 3 miles carrying 2 pound weights and also does the bicycle or elliptical at the [facility] for 30 minutes.  Denies any exertional symptoms.  Started statin in October 2020.  Never smoked.  Father had MI in 58s.  Mother had CVA in 42s.  Since last clinic visit, he reports that he is doing well.  He continues to deny any chest pain or dyspnea.  Reports he exercises at the [facility] with elliptical and stationary bicycle 3 times per week for at least 30 minutes each time.  Denies any exertional symptoms.  Denies any lightheadedness, syncope, palpitations, lower extremity edema, or leg pain.  Reports BP has been 140s over 70s when he checks at home.    Past Medical History:  Diagnosis Date  . Cancer [facility])    prostate cancer  . DIABETES MELLITUS, TYPE I 11/17/2006  . DIABETIC  RETINOPATHY 11/17/2006  . 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 HYPERLIPIDEMIA 11/17/2006  . POLYP, GALLBLADDER 11/17/2006  . TOBACCO USE, QUIT 11/17/2006  . VARICOSE VEINS, LOWER EXTREMITIES 11/17/2006    Past Surgical History:  Procedure Laterality  Date  . KNEE ARTHROSCOPY     Left  . PROSTATE SURGERY  October 2016   [facility]/ Dr. Georgetta Haber   . SHOULDER SURGERY     Left  . VASECTOMY    . VITRECTOMY  2009   Right    Current Medications: Current Meds  Medication Sig  . ACCU-CHEK AVIVA PLUS test strip CHECK BLOOD SUGAR FIVE TIMES DAILY  . Accu-Chek Softclix Lancets lancets Use to check blood sugar 5 times per day. Dx Code: E10.8  . Alcohol Swabs (B-D SINGLE USE SWABS REGULAR) PADS USE AS DIRECTED  . aspirin EC 81 MG tablet Take 81 mg by mouth daily.  Marland Kitchen atorvastatin (LIPITOR) 40 MG tablet Take 1 tablet (40 mg total) by mouth daily.  . Blood Glucose Monitoring Suppl (ACCU-CHEK AVIVA) device Use to monitor glucose levels 5 times daily; E10.8  . Cyanocobalamin (B-12 COMPLIANCE INJECTION IJ) Inject as directed every 30 (thirty) days.  . DROPLET INSULIN SYRINGE 30G X 1/2" 0.3 ML MISC USE AS DIRECTED FOUR TIMES A DAY  . insulin aspart (NOVOLOG) 100 UNIT/ML injection Inject 1-6 Units into the skin 3 (three) times daily with meals. 1-4 units with breakfast and lunch, and 3-6 units with supper.  Marland Kitchen LANTUS 100 UNIT/ML injection INJECT 35 UNITS INTO THE SKIN EVERY MORNING (DISCARD VIAL 28 DAYS AFTER OPENING)  . lisinopril (ZESTRIL) 20 MG tablet Take 1 tablet (20 mg total) by  mouth daily.  . sildenafil (VIAGRA) 100 MG tablet Take 1 tablet (100 mg total) by mouth daily as needed for erectile dysfunction.  . Vitamin D, Ergocalciferol, (DRISDOL) 1.25 MG (50000 UT) CAPS capsule Take 1 capsule (50,000 Units total) by mouth every 7 (seven) days.     Allergies:   Penicillins   Social History   Socioeconomic History  . Marital status: Married    Spouse name: Presenter, broadcasting  . Number of children: 3  . Years of education: Not on file  . Highest education level: Not on file  Occupational History  . Occupation: Airline pilot  Tobacco Use  . Smoking status: Never Smoker  . Smokeless tobacco: Never Used  Substance and  Sexual Activity  . Alcohol use: Yes    Comment: 5-6/week  . Drug use: No  . Sexual activity: Not on file  Other Topics Concern  . Not on file  Social History Narrative  . Not on file   Social Determinants of Health   Financial Resource Strain:   . Difficulty of Paying Living Expenses: Not on file  Food Insecurity:   . Worried About Charity fundraiser in the Last Year: Not on file  . Ran Out of Food in the Last Year: Not on file  Transportation Needs:   . Lack of Transportation (Medical): Not on file  . Lack of Transportation (Non-Medical): Not on file  Physical Activity:   . Days of Exercise per Week: Not on file  . Minutes of Exercise per Session: Not on file  Stress:   . Feeling of Stress : Not on file  Social Connections:   . Frequency of Communication with Friends and Family: Not on file  . Frequency of Social Gatherings with Friends and Family: Not on file  . Attends Religious Services: Not on file  . Active Member of Clubs or Organizations: Not on file  . Attends Archivist Meetings: Not on file  . Marital Status: Not on file     Family History: The patient's family history includes Cancer in his father; Hypertension in his mother; Varicose Veins in his maternal aunt and maternal grandmother.  ROS:   Please see the history of present illness.     All other systems reviewed and are negative.  EKGs/Labs/Other Studies Reviewed:    The following studies were reviewed today:   EKG:  EKG is ordered today.  The ekg ordered today demonstrates normal sinus rhythm, rate 68, no ST/T abnormalities  Recent Labs: 11/17/2018: ALT 15; Hemoglobin 15.2; Platelets 247.0; TSH 1.50 03/20/2019: BUN 10; Creatinine, Ser 0.85; Potassium 4.7; Sodium 143  Recent Lipid Panel    Component Value Date/Time   CHOL 180 11/17/2018 0929   TRIG 61.0 11/17/2018 0929   HDL 49.40 11/17/2018 0929   CHOLHDL 4 11/17/2018 0929   VLDL 12.2 11/17/2018 0929   LDLCALC 119 (H) 11/17/2018  0929   LDLDIRECT 123.2 01/22/2013 1101    Calcium score 02/07/19: Coronary calcium score of 1397. This was 97th percentile for age and sex matched control. Recommend aggressive risk factor modification. Consider noninvasive ischemic testing.  Lexiscan Myoview 02/28/2019:  The left ventricular ejection fraction is normal (55-65%).  Nuclear stress EF: 61%.  There was no ST segment deviation noted during stress.  The study is normal. No evidence of ischemia.  This is a low risk study.  No prior study for comparison.  Physical Exam:    VS:  BP 140/70   Pulse 68  Temp (!) 96.6 F (35.9 C)   Ht 5\' 8"  (1.727 m)   Wt 210 lb 3.2 oz (95.3 kg)   SpO2 99%   BMI 31.96 kg/m     Wt Readings from Last 3 Encounters:  11/09/19 210 lb 3.2 oz (95.3 kg)  10/05/19 206 lb (93.4 kg)  05/03/19 201 lb 12.8 oz (91.5 kg)     GEN:  Well nourished, well developed in no acute distress HEENT: Normal NECK: No JVD CARDIAC: RRR, no murmurs, rubs, gallops RESPIRATORY:  Clear to auscultation without rales, wheezing or rhonchi  ABDOMEN: Soft, non-tender, non-distended MUSCULOSKELETAL:  No edema; No deformity  SKIN: Warm and dry NEUROLOGIC:  Alert and oriented x 3 PSYCHIATRIC:  Normal affect   ASSESSMENT:    1. Coronary artery disease involving native coronary artery of native heart without angina pectoris   2. Hypertension, unspecified type   3. Hyperlipidemia, unspecified hyperlipidemia type    PLAN:    Coronary artery disease: calcium score on 02/07/2019 was 1397 (97th percentile for age/gender).  Given the significant elevation in calcium score, he underwent a Lexiscan Myoview on 02/28/2019, which showed no evidence of ischemia, EF 61%. -Last LDL 119 on 11/17/2018.  Increased atorvastatin from 10 mg to 40 mg daily on 03/05/19.  We will recheck lipid panel and increase statin as needed for goal LDL less than 70.    Hypertension: On lisinopril 20 mg daily.  BP elevated, will add amlodipine 5  mg daily.  Asked patiet to check BP twice daily for next 2 weeks and call with results.  Hyperlipidemia: LDL 119 on 11/17/2018, atorvastatin increased to 40 mg daily on 03/05/2019 as above.  Will recheck lipid panel  Type 1 diabetes: On insulin, follows with endocrinology.  A1c 8.4% on 10/05/2019.  RTC in 6 months   Medication Adjustments/Labs and Tests Ordered: Current medicines are reviewed at length with the patient today.  Concerns regarding medicines are outlined above.  Orders Placed This Encounter  Procedures  . Basic metabolic panel  . Lipid panel  . EKG 12-Lead   Meds ordered this encounter  Medications  . amLODipine (NORVASC) 5 MG tablet    Sig: Take 1 tablet (5 mg total) by mouth daily.    Dispense:  90 tablet    Refill:  3    Patient Instructions  Medication Instructions:  START amlodipine 5 mg daily --check your blood pressure 2 times daily for 1 week, write it down.  Call or send message via Mychart with readings.   *If you need a refill on your cardiac medications before your next appointment, please call your pharmacy*   Lab Work: Lipid, BMET today  If you have labs (blood work) drawn today and your tests are completely normal, you will receive your results only by: Marland Kitchen MyChart Message (if you have MyChart) OR . A paper copy in the mail If you have any lab test that is abnormal or we need to change your treatment, we will call you to review the results.  Follow-Up: At [facility], you and your health needs are our priority.  As part of our continuing mission to provide you with exceptional heart care, we have created designated Provider Care Teams.  These Care Teams include your primary Cardiologist (physician) and Advanced Practice Providers (APPs -  Physician Assistants and Nurse Practitioners) who all work together to provide you with the care you need, when you need it.  We recommend signing up for the patient portal called "MyChart".  Sign up  information is provided on this After Visit Summary.  MyChart is used to connect with patients for Virtual Visits (Telemedicine).  Patients are able to view lab/test results, encounter notes, upcoming appointments, etc.  Non-urgent messages can be sent to your provider as well.   To learn more about what you can do with MyChart, go to [email or web address].    Your next appointment:   6 month(s)  The format for your next appointment:   In Person  Provider:   Oswaldo Milian, MD      Signed, Donato Heinz, MD  11/09/2019 1:17 PM    [place]

## 2019-11-06 ENCOUNTER — Other Ambulatory Visit: Payer: Self-pay | Admitting: Endocrinology

## 2019-11-06 DIAGNOSIS — E108 Type 1 diabetes mellitus with unspecified complications: Secondary | ICD-10-CM

## 2019-11-09 ENCOUNTER — Ambulatory Visit (INDEPENDENT_AMBULATORY_CARE_PROVIDER_SITE_OTHER): Payer: 59 | Admitting: Cardiology

## 2019-11-09 ENCOUNTER — Other Ambulatory Visit: Payer: Self-pay

## 2019-11-09 ENCOUNTER — Encounter: Payer: Self-pay | Admitting: Cardiology

## 2019-11-09 VITALS — BP 140/70 | HR 68 | Temp 96.6°F | Ht 68.0 in | Wt 210.2 lb

## 2019-11-09 DIAGNOSIS — I10 Essential (primary) hypertension: Secondary | ICD-10-CM | POA: Diagnosis not present

## 2019-11-09 DIAGNOSIS — I2510 Atherosclerotic heart disease of native coronary artery without angina pectoris: Secondary | ICD-10-CM

## 2019-11-09 DIAGNOSIS — E785 Hyperlipidemia, unspecified: Secondary | ICD-10-CM

## 2019-11-09 LAB — LIPID PANEL
Chol/HDL Ratio: 2.5 ratio (ref 0.0–5.0)
Cholesterol, Total: 152 mg/dL (ref 100–199)
HDL: 60 mg/dL (ref >39)
LDL Chol Calc (NIH): 80 mg/dL (ref 0–99)
Triglycerides: 60 mg/dL (ref 0–149)
VLDL Cholesterol Cal: 12 mg/dL (ref 5–40)

## 2019-11-09 LAB — BASIC METABOLIC PANEL
BUN/Creatinine Ratio: 14 (ref 10–24)
BUN: 11 mg/dL (ref 8–27)
CO2: 26 mmol/L (ref 20–29)
Calcium: 9.4 mg/dL (ref 8.6–10.2)
Chloride: 101 mmol/L (ref 96–106)
Creatinine, Ser: 0.78 mg/dL (ref 0.76–1.27)
GFR calc Af Amer: 112 mL/min/{1.73_m2} (ref >59)
GFR calc non Af Amer: 97 mL/min/{1.73_m2} (ref >59)
Glucose: 178 mg/dL — ABNORMAL HIGH (ref 65–99)
Potassium: 5.4 mmol/L — ABNORMAL HIGH (ref 3.5–5.2)
Sodium: 139 mmol/L (ref 134–144)

## 2019-11-09 MED ORDER — AMLODIPINE BESYLATE 5 MG PO TABS: 5.0000 mg | ORAL_TABLET | Freq: Every day | ORAL | 3 refills | Status: DC

## 2019-11-09 NOTE — Patient Instructions (Signed)
Medication Instructions:  START amlodipine 5 mg daily --check your blood pressure 2 times daily for 1 week, write it down.  Call or send message via Mychart with readings.   *If you need a refill on your cardiac medications before your next appointment, please call your pharmacy*   Lab Work: Lipid, BMET today  If you have labs (blood work) drawn today and your tests are completely normal, you will receive your results only by: Marland Kitchen MyChart Message (if you have MyChart) OR . A paper copy in the mail If you have any lab test that is abnormal or we need to change your treatment, we will call you to review the results.  Follow-Up: At [facility], you and your health needs are our priority.  As part of our continuing mission to provide you with exceptional heart care, we have created designated Provider Care Teams.  These Care Teams include your primary Cardiologist (physician) and Advanced Practice Providers (APPs -  Physician Assistants and Nurse Practitioners) who all work together to provide you with the care you need, when you need it.  We recommend signing up for the patient portal called "MyChart".  Sign up information is provided on this After Visit Summary.  MyChart is used to connect with patients for Virtual Visits (Telemedicine).  Patients are able to view lab/test results, encounter notes, upcoming appointments, etc.  Non-urgent messages can be sent to your provider as well.   To learn more about what you can do with MyChart, go to [email or web address].    Your next appointment:   6 month(s)  The format for your next appointment:   In Person  Provider:   Oswaldo Milian, MD

## 2019-11-12 ENCOUNTER — Other Ambulatory Visit: Payer: Self-pay | Admitting: *Deleted

## 2019-11-12 DIAGNOSIS — I10 Essential (primary) hypertension: Secondary | ICD-10-CM

## 2019-11-12 DIAGNOSIS — E875 Hyperkalemia: Secondary | ICD-10-CM

## 2019-11-23 ENCOUNTER — Other Ambulatory Visit: Payer: Self-pay

## 2019-11-23 ENCOUNTER — Encounter: Payer: 59 | Admitting: Internal Medicine

## 2019-11-23 DIAGNOSIS — I10 Essential (primary) hypertension: Secondary | ICD-10-CM

## 2019-11-23 DIAGNOSIS — E875 Hyperkalemia: Secondary | ICD-10-CM

## 2019-11-23 LAB — BASIC METABOLIC PANEL
BUN/Creatinine Ratio: 18 (ref 10–24)
BUN: 13 mg/dL (ref 8–27)
CO2: 23 mmol/L (ref 20–29)
Calcium: 9.1 mg/dL (ref 8.6–10.2)
Chloride: 105 mmol/L (ref 96–106)
Creatinine, Ser: 0.72 mg/dL — ABNORMAL LOW (ref 0.76–1.27)
GFR calc Af Amer: 116 mL/min/{1.73_m2} (ref >59)
GFR calc non Af Amer: 100 mL/min/{1.73_m2} (ref >59)
Glucose: 133 mg/dL — ABNORMAL HIGH (ref 65–99)
Potassium: 4.1 mmol/L (ref 3.5–5.2)
Sodium: 142 mmol/L (ref 134–144)

## 2019-12-10 ENCOUNTER — Other Ambulatory Visit: Payer: Self-pay

## 2019-12-10 ENCOUNTER — Encounter: Payer: Self-pay | Admitting: Internal Medicine

## 2019-12-10 ENCOUNTER — Ambulatory Visit (INDEPENDENT_AMBULATORY_CARE_PROVIDER_SITE_OTHER): Payer: 59 | Admitting: Internal Medicine

## 2019-12-10 VITALS — BP 130/70 | HR 77 | Temp 98.2°F | Ht 68.0 in | Wt 209.6 lb

## 2019-12-10 DIAGNOSIS — E108 Type 1 diabetes mellitus with unspecified complications: Secondary | ICD-10-CM | POA: Diagnosis not present

## 2019-12-10 DIAGNOSIS — Z0000 Encounter for general adult medical examination without abnormal findings: Secondary | ICD-10-CM | POA: Diagnosis not present

## 2019-12-10 DIAGNOSIS — E559 Vitamin D deficiency, unspecified: Secondary | ICD-10-CM

## 2019-12-10 DIAGNOSIS — C61 Malignant neoplasm of prostate: Secondary | ICD-10-CM

## 2019-12-10 DIAGNOSIS — E538 Deficiency of other specified B group vitamins: Secondary | ICD-10-CM

## 2019-12-10 DIAGNOSIS — Z23 Encounter for immunization: Secondary | ICD-10-CM | POA: Diagnosis not present

## 2019-12-10 LAB — CBC WITH DIFFERENTIAL/PLATELET
Basophils Absolute: 0.1 10*3/uL (ref 0.0–0.1)
Basophils Relative: 1.1 % (ref 0.0–3.0)
Eosinophils Absolute: 0.2 10*3/uL (ref 0.0–0.7)
Eosinophils Relative: 1.8 % (ref 0.0–5.0)
HCT: 44.6 % (ref 39.0–52.0)
Hemoglobin: 15.3 g/dL (ref 13.0–17.0)
Lymphocytes Relative: 34.4 % (ref 12.0–46.0)
Lymphs Abs: 3.2 10*3/uL (ref 0.7–4.0)
MCHC: 34.3 g/dL (ref 30.0–36.0)
MCV: 92.6 fl (ref 78.0–100.0)
Monocytes Absolute: 0.7 10*3/uL (ref 0.1–1.0)
Monocytes Relative: 7.7 % (ref 3.0–12.0)
Neutro Abs: 5 10*3/uL (ref 1.4–7.7)
Neutrophils Relative %: 55 % (ref 43.0–77.0)
Platelets: 275 10*3/uL (ref 150.0–400.0)
RBC: 4.82 Mil/uL (ref 4.22–5.81)
RDW: 13.9 % (ref 11.5–15.5)
WBC: 9.2 10*3/uL (ref 4.0–10.5)

## 2019-12-10 LAB — LIPID PANEL
Cholesterol: 147 mg/dL (ref 0–200)
HDL: 69.6 mg/dL (ref >39.00)
LDL Cholesterol: 63 mg/dL (ref 0–99)
NonHDL: 77.15
Total CHOL/HDL Ratio: 2
Triglycerides: 72 mg/dL (ref 0.0–149.0)
VLDL: 14.4 mg/dL (ref 0.0–40.0)

## 2019-12-10 LAB — URINALYSIS, ROUTINE W REFLEX MICROSCOPIC
Bilirubin Urine: NEGATIVE
Hgb urine dipstick: NEGATIVE
Ketones, ur: NEGATIVE
Leukocytes,Ua: NEGATIVE
Nitrite: NEGATIVE
RBC / HPF: NONE SEEN (ref 0–?)
Specific Gravity, Urine: 1.015 (ref 1.000–1.030)
Total Protein, Urine: NEGATIVE
Urine Glucose: NEGATIVE
Urobilinogen, UA: 0.2 (ref 0.0–1.0)
WBC, UA: NONE SEEN (ref 0–?)
pH: 8 (ref 5.0–8.0)

## 2019-12-10 LAB — HEPATIC FUNCTION PANEL
ALT: 18 U/L (ref 0–53)
AST: 14 U/L (ref 0–37)
Albumin: 4 g/dL (ref 3.5–5.2)
Alkaline Phosphatase: 78 U/L (ref 39–117)
Bilirubin, Direct: 0.2 mg/dL (ref 0.0–0.3)
Total Bilirubin: 0.6 mg/dL (ref 0.2–1.2)
Total Protein: 6.8 g/dL (ref 6.0–8.3)

## 2019-12-10 LAB — VITAMIN B12: Vitamin B-12: >1526 pg/mL — ABNORMAL HIGH (ref 211–911)

## 2019-12-10 LAB — PSA: PSA: 0 ng/mL — ABNORMAL LOW (ref 0.10–4.00)

## 2019-12-10 LAB — VITAMIN D 25 HYDROXY (VIT D DEFICIENCY, FRACTURES): VITD: 35.96 ng/mL (ref 30.00–100.00)

## 2019-12-10 LAB — TSH: TSH: 1 u[IU]/mL (ref 0.35–4.50)

## 2019-12-10 NOTE — Progress Notes (Signed)
Subjective:    Patient ID: Donald Lopez, male    DOB: 1955/05/02, 64 y.o.   MRN: [id number]  HPI  Here for wellness and f/u;  Overall doing ok;  Pt denies Chest pain, worsening SOB, DOE, wheezing, orthopnea, PND, worsening LE edema, palpitations, dizziness or syncope.  Pt denies neurological change such as new headache, facial or extremity weakness.  Pt denies polydipsia, polyuria, or low sugar symptoms. Pt states overall good compliance with treatment and medications, good tolerability, and has been trying to follow appropriate diet.  Pt denies worsening depressive symptoms, suicidal ideation or panic. No fever, night sweats, wt loss, loss of appetite, or other constitutional symptoms.  Pt states good ability with ADL's, has low fall risk, home safety reviewed and adequate, no other significant changes in hearing or vision, and only occasionally active with exercise. Still sees endo for DM and lipids, BP recently ave 130/70.  No new complaints Past Medical History:  Diagnosis Date  . Cancer [facility])    prostate cancer  . Cancer of prostate with intermediate recurrence risk (stage T2b-c or Gleason 7 or PSA 10-20) ([place]) 07/30/2014  . DIABETES MELLITUS, TYPE I 11/17/2006  . DIABETIC  RETINOPATHY 11/17/2006  . 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 HYPERLIPIDEMIA 11/17/2006  . 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 POLYP, GALLBLADDER 11/17/2006  . 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 s/p PRP OD  s/p PPV with laser and cryo OS 5/10  . TOBACCO USE, QUIT 11/17/2006  . Trigger finger   . VARICOSE VEINS, LOWER EXTREMITIES 11/17/2006   Past Surgical History:  Procedure Laterality Date  . KNEE ARTHROSCOPY     Left  . PROSTATE SURGERY  October 2016   [facility]/ Dr. Georgetta Haber   . SHOULDER SURGERY     Left  . VASECTOMY    . VITRECTOMY  2009   Right    reports that he has never smoked. He has never used smokeless  tobacco. He reports current alcohol use. He reports that he does not use drugs. family history includes Cancer in his father; Hypertension in his mother; Varicose Veins in his maternal aunt and maternal grandmother. Allergies  Allergen Reactions  . Penicillins     REACTION: unspecified reaction   Current Outpatient Medications on File Prior to Visit  Medication Sig Dispense Refill  . ACCU-CHEK AVIVA PLUS test strip CHECK BLOOD SUGAR FIVE TIMES DAILY 450 strip 0  . Accu-Chek Softclix Lancets lancets Use to check blood sugar 5 times per day. Dx Code: E10.8 100 each 12  . Alcohol Swabs (B-D SINGLE USE SWABS REGULAR) PADS USE AS DIRECTED 100 each 0  . amLODipine (NORVASC) 5 MG tablet Take 1 tablet (5 mg total) by mouth daily. 90 tablet 3  . aspirin EC 81 MG tablet Take 81 mg by mouth daily.    Marland Kitchen atorvastatin (LIPITOR) 40 MG tablet Take 1 tablet (40 mg total) by mouth daily. 90 tablet 3  . Cyanocobalamin (B-12 COMPLIANCE INJECTION IJ) Inject as directed every 30 (thirty) days.    . DROPLET INSULIN SYRINGE 30G X 1/2" 0.3 ML MISC USE AS DIRECTED FOUR TIMES A DAY 400 each 2  . insulin aspart (NOVOLOG) 100 UNIT/ML injection Inject 1-6 Units into the skin 3 (three) times daily with meals. 1-4 units with breakfast and lunch, and 3-6 units with supper. 10 mL 11  . LANTUS 100 UNIT/ML injection INJECT 35 UNITS INTO THE SKIN EVERY MORNING (  DISCARD VIAL 28 DAYS AFTER OPENING) 30 mL 1  . lisinopril (ZESTRIL) 20 MG tablet Take 1 tablet (20 mg total) by mouth daily. 90 tablet 3  . sildenafil (VIAGRA) 100 MG tablet Take 1 tablet (100 mg total) by mouth daily as needed for erectile dysfunction. 30 tablet 5  . Vitamin D, Ergocalciferol, (DRISDOL) 1.25 MG (50000 UT) CAPS capsule Take 1 capsule (50,000 Units total) by mouth every 7 (seven) days. 12 capsule 0  . Blood Glucose Monitoring Suppl (ACCU-CHEK AVIVA) device Use to monitor glucose levels 5 times daily; E10.8 1 each 0   No current facility-administered  medications on file prior to visit.   Review of Systems All otherwise neg per pt    Objective:   Physical Exam BP 130/70   Pulse 77   Temp 98.2 F (36.8 C) (Oral)   Ht 5\' 8"  (1.727 m)   Wt 209 lb 9.6 oz (95.1 kg)   SpO2 96%   BMI 31.87 kg/m  VS noted,  Constitutional: Pt appears in NAD HENT: Head: NCAT.  Right Ear: External ear normal.  Left Ear: External ear normal.  Eyes: . Pupils are equal, round, and reactive to light. Conjunctivae and EOM are normal Nose: without d/c or deformity Neck: Neck supple. Gross normal ROM Cardiovascular: Normal rate and regular rhythm.   Pulmonary/Chest: Effort normal and breath sounds without rales or wheezing.  Abd:  Soft, NT, ND, + BS, no organomegaly Neurological: Pt is alert. At baseline orientation, motor grossly intact Skin: Skin is warm. No rashes, other new lesions, no LE edema Psychiatric: Pt behavior is normal without agitation  All otherwise neg per pt Lab Results  Component Value Date   WBC 9.2 12/10/2019   HGB 15.3 12/10/2019   HCT 44.6 12/10/2019   PLT 275.0 12/10/2019   GLUCOSE 133 (H) 11/23/2019   CHOL 147 12/10/2019   TRIG 72.0 12/10/2019   HDL 69.60 12/10/2019   LDLDIRECT 123.2 01/22/2013   LDLCALC 63 12/10/2019   ALT 18 12/10/2019   AST 14 12/10/2019   NA 142 11/23/2019   K 4.1 11/23/2019   CL 105 11/23/2019   CREATININE 0.72 (L) 11/23/2019   BUN 13 11/23/2019   CO2 23 11/23/2019   TSH 1.00 12/10/2019   PSA 0.00 (L) 12/10/2019   HGBA1C 8.4 (A) 10/05/2019   MICROALBUR <0.7 12/10/2019      Assessment & Plan:

## 2019-12-10 NOTE — Patient Instructions (Addendum)
You had the flu shot today  Please check with [employer] to see if your Shingles shot is covered  Please continue all other medications as before, and refills have been done if requested.  Please have the pharmacy call with any other refills you may need.  Please continue your efforts at being more active, low cholesterol diet, and weight control.  You are otherwise up to date with prevention measures today.  Please keep your appointments with your specialists as you may have planned, and make sure to follow up with urology, and your eye specialist after jan 1  Please go to the LAB at the blood drawing area for the tests to be done  You will be contacted by phone if any changes need to be made immediately.  Otherwise, you will receive a letter about your results with an explanation, but please check with MyChart first.  Please remember to sign up for MyChart if you have not done so, as this will be important to you in the future with finding out test results, communicating by private email, and scheduling acute appointments online when needed.  Please make an Appointment to return for your 1 year visit, or sooner if needed, with Lab testing by Appointment as well, to be done about 3-5 days before at the [facility] (so this is for TWO appointments - please see the scheduling desk as you leave)  Due to the ongoing Covid 19 pandemic, our lab now requires an appointment for any labs done at our office.  If you need labs done and do not have an appointment, please call our office ahead of time to schedule before presenting to the lab for your testing.

## 2019-12-11 ENCOUNTER — Encounter: Payer: Self-pay | Admitting: Internal Medicine

## 2019-12-11 LAB — MICROALBUMIN / CREATININE URINE RATIO
Creatinine,U: 51.4 mg/dL
Microalb Creat Ratio: 1.4 mg/g (ref 0.0–30.0)
Microalb, Ur: <0.7 mg/dL (ref 0.0–1.9)

## 2019-12-12 ENCOUNTER — Ambulatory Visit (INDEPENDENT_AMBULATORY_CARE_PROVIDER_SITE_OTHER): Payer: 59

## 2019-12-12 ENCOUNTER — Other Ambulatory Visit: Payer: Self-pay

## 2019-12-12 DIAGNOSIS — Z23 Encounter for immunization: Secondary | ICD-10-CM

## 2019-12-12 NOTE — Progress Notes (Signed)
Patient ID: Donald Lopez, male   DOB: [birth year], 64 y.o.   MRN: [id number] .Medical screening examination/treatment/procedure(s) were performed by non-physician practitioner and as supervising physician I was immediately available for consultation/collaboration. I agree with above. Cathlean Cower, MD

## 2019-12-12 NOTE — Progress Notes (Signed)
Per orders of Dr. Cathlean Cower  Shingles vaccine was given by Clayborne Dana, [place]. Patient tolerated injection well.

## 2019-12-16 ENCOUNTER — Encounter: Payer: Self-pay | Admitting: Internal Medicine

## 2019-12-16 NOTE — Assessment & Plan Note (Signed)

## 2019-12-16 NOTE — Assessment & Plan Note (Signed)
Cont to f/u endo

## 2019-12-16 NOTE — Assessment & Plan Note (Signed)
Cont f/u urology

## 2019-12-22 ENCOUNTER — Other Ambulatory Visit: Payer: Self-pay | Admitting: Endocrinology

## 2019-12-22 DIAGNOSIS — E108 Type 1 diabetes mellitus with unspecified complications: Secondary | ICD-10-CM

## 2019-12-27 ENCOUNTER — Other Ambulatory Visit: Payer: Self-pay

## 2019-12-27 ENCOUNTER — Encounter: Payer: Self-pay | Admitting: Physician Assistant

## 2019-12-27 ENCOUNTER — Ambulatory Visit (INDEPENDENT_AMBULATORY_CARE_PROVIDER_SITE_OTHER): Payer: 59 | Admitting: Physician Assistant

## 2019-12-27 DIAGNOSIS — Z1283 Encounter for screening for malignant neoplasm of skin: Secondary | ICD-10-CM | POA: Diagnosis not present

## 2019-12-27 DIAGNOSIS — L821 Other seborrheic keratosis: Secondary | ICD-10-CM | POA: Diagnosis not present

## 2019-12-27 DIAGNOSIS — D485 Neoplasm of uncertain behavior of skin: Secondary | ICD-10-CM

## 2019-12-27 DIAGNOSIS — D1800 Hemangioma unspecified site: Secondary | ICD-10-CM

## 2019-12-27 DIAGNOSIS — M216X2 Other acquired deformities of left foot: Secondary | ICD-10-CM

## 2019-12-27 DIAGNOSIS — M216X9 Other acquired deformities of unspecified foot: Secondary | ICD-10-CM

## 2019-12-27 NOTE — Progress Notes (Signed)
   New Patient   Subjective  Donald Lopez is a 64 y.o. male who presents for the following: New Patient (Initial Visit) (Patient here today for skin check. Patient states he has a spot on his left temple x unsure no bleeding, crusty, and it's grown some. Also check spots under both axilla. Corn on the bottom of left foot.).   The following portions of the chart were reviewed this encounter and updated as appropriate:  Tobacco  Allergies  Meds  Problems  Med Hx  Surg Hx  Fam Hx      Objective  Well appearing patient in no apparent distress; mood and affect are within normal limits.  All skin waist up examined.  Objective  waist up and feet: Full body skin examination- no atypical moles or non mole skin cancer  Objective  Abdomen: Multiple raised red papules  Objective  Back, face, abdomen: Stuck-on, waxy, tan-brown papules and plaques. --Discussed benign etiology and prognosis.   Objective  Left Zygomatic Area: Flat bichromic macule x 3 years Phones updated- unable to take photo due to update 3.0cm- left outer eye &  5.5 cm- left upper ear junction  Objective  Left 5th Metatarsal Plantar Area: Thick hard nodule  Assessment & Plan  Encounter for screening for malignant neoplasm of skin waist up and feet  Yearly skin exams  Hemangioma, unspecified site Abdomen  No need for removal if stable  Seborrheic keratosis Back, face, abdomen  Okay to leave if stable  Neoplasm of uncertain behavior of skin Left Zygomatic Area  Skin / nail biopsy Type of biopsy: tangential   Informed consent: discussed and consent obtained   Timeout: patient name, date of birth, surgical site, and procedure verified   Procedure prep:  Patient was prepped and draped in usual sterile fashion (Non sterile) Prep type:  Chlorhexidine Anesthesia: the lesion was anesthetized in a standard fashion   Anesthetic:  1% lidocaine w/ epinephrine 1-100,000 local infiltration Instrument  used: flexible razor blade   Outcome: patient tolerated procedure well   Post-procedure details: wound care instructions given   Additional details:  Cautery only   Specimen 1 - Surgical pathology Differential Diagnosis: r/o atypia  Check Margins: yes  Cavus deformity of foot, 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 The documentation on 12/27/19 for the exam, diagnosis, procedures, and orders are all accurate and complete.

## 2019-12-27 NOTE — Patient Instructions (Signed)

## 2020-01-02 ENCOUNTER — Telehealth: Payer: Self-pay

## 2020-01-02 NOTE — Telephone Encounter (Signed)
-----   Message from Warren Danes, [place] sent at 01/02/2020  2:29 PM EST ----- Small precancer. RTC if recurs. Recheck 6 months

## 2020-01-02 NOTE — Telephone Encounter (Signed)
Phone call to patient with his pathology results. Patient aware.  

## 2020-01-07 NOTE — Addendum Note (Signed)
Addended by: Robyne Askew R on: 01/07/2020 12:34 PM   Modules accepted: Level of Service

## 2020-01-24 DIAGNOSIS — H3341 Traction detachment of retina, right eye: Secondary | ICD-10-CM | POA: Insufficient documentation

## 2020-02-08 ENCOUNTER — Ambulatory Visit: Payer: 59 | Admitting: Endocrinology

## 2020-02-23 ENCOUNTER — Other Ambulatory Visit: Payer: Self-pay | Admitting: Internal Medicine

## 2020-02-23 DIAGNOSIS — E108 Type 1 diabetes mellitus with unspecified complications: Secondary | ICD-10-CM

## 2020-02-23 NOTE — Telephone Encounter (Signed)
Please refill as per office routine med refill policy (all routine meds refilled for 3 mo or monthly per pt preference up to one year from last visit, then month to month grace period for 3 mo, then further med refills will have to be denied)  

## 2020-02-29 ENCOUNTER — Ambulatory Visit: Payer: 59 | Admitting: Physician Assistant

## 2020-03-07 ENCOUNTER — Other Ambulatory Visit: Payer: Self-pay

## 2020-03-10 ENCOUNTER — Other Ambulatory Visit: Payer: Self-pay

## 2020-03-10 ENCOUNTER — Ambulatory Visit (INDEPENDENT_AMBULATORY_CARE_PROVIDER_SITE_OTHER): Payer: 59

## 2020-03-10 DIAGNOSIS — Z23 Encounter for immunization: Secondary | ICD-10-CM | POA: Diagnosis not present

## 2020-03-16 ENCOUNTER — Other Ambulatory Visit: Payer: Self-pay | Admitting: Cardiology

## 2020-03-28 ENCOUNTER — Ambulatory Visit: Payer: 59 | Admitting: Endocrinology

## 2020-04-03 ENCOUNTER — Other Ambulatory Visit: Payer: Self-pay

## 2020-04-07 ENCOUNTER — Ambulatory Visit (INDEPENDENT_AMBULATORY_CARE_PROVIDER_SITE_OTHER): Payer: 59 | Admitting: Endocrinology

## 2020-04-07 ENCOUNTER — Other Ambulatory Visit: Payer: Self-pay

## 2020-04-07 VITALS — BP 130/70 | HR 80 | Ht 68.0 in | Wt 207.4 lb

## 2020-04-07 DIAGNOSIS — E108 Type 1 diabetes mellitus with unspecified complications: Secondary | ICD-10-CM

## 2020-04-07 LAB — POCT GLYCOSYLATED HEMOGLOBIN (HGB A1C): Hemoglobin A1C: 8.4 % — AB (ref 4.0–5.6)

## 2020-04-07 MED ORDER — INSULIN GLARGINE 100 UNIT/ML ~~LOC~~ SOLN: 38.0000 [IU] | Freq: Every day | SUBCUTANEOUS | 11 refills | Status: DC

## 2020-04-07 NOTE — Patient Instructions (Addendum)
Please continue the the same 2 insulins.   Please let us know if "Semglee" is cheaper than Lantus, so we can change.   check your blood sugar 5 times a day.  vary the time of day when you check, between before the 3 meals, and at bedtime.  also check if you have symptoms of your blood sugar being too high or too low.  please keep a record of the readings and bring it to your next appointment here (or you can bring the meter itself).  You can write it on any piece of paper.  please call us sooner if your blood sugar goes below 70, or if you have a lot of readings over 200.   If you are going to be active, and can anticipate it, skip the prior dose of novolog.  If not, eat a light snack with it.   Please come back for a follow-up appointment in 4 months.

## 2020-04-07 NOTE — Progress Notes (Unsigned)
Subjective:    Patient ID: Donald Lopez, male    DOB: 01/13/1956, 65 y.o.   MRN: [id number]  HPI Pt returns for f/u of diabetes mellitus:  DM type: 1 Dx'ed: 3300 Complications: CAD and PDR.  Therapy: insulin since soon after dx.   DKA: never.  Severe hypoglycemia: never.   Pancreatitis: never.   Other: he has declined pump and continuous glucose monitor; he takes multiple daily injections, but emphasizing basal has helped A1c.   Interval history: no cbg record, but states cbg's vary from 66-290.  He seldom has hypoglycemia, and these episodes are mild.  This usually happens any time of day, but most commonly before lunch.  It is in general highest at HS.  He reduced the Lantus to 38 units qd.  He divides the Lantus BID.   Past Medical History:  Diagnosis Date  . Cancer [facility])    prostate cancer  . Cancer of prostate with intermediate recurrence risk (stage T2b-c or Gleason 7 or PSA 10-20) ([place]) 07/30/2014  . DIABETES MELLITUS, TYPE I 11/17/2006  . DIABETIC  RETINOPATHY 11/17/2006  . 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 HYPERLIPIDEMIA 11/17/2006  . 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 POLYP, GALLBLADDER 11/17/2006  . 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 s/p PRP OD  s/p PPV with laser and cryo OS 5/10  . TOBACCO USE, QUIT 11/17/2006  . Trigger finger   . VARICOSE VEINS, LOWER EXTREMITIES 11/17/2006    Past Surgical History:  Procedure Laterality Date  . KNEE ARTHROSCOPY     Left  . PROSTATE SURGERY  October 2016   [facility]/ Dr. Georgetta Haber   . SHOULDER SURGERY     Left  . VASECTOMY    . VITRECTOMY  2009   Right    Social History   Socioeconomic History  . Marital status: Married    Spouse name: Presenter, broadcasting  . Number of children: 3  . Years of education: Not on file  . Highest education level: Not on file  Occupational History  . Occupation: Airline pilot  Tobacco Use   . Smoking status: Never Smoker  . Smokeless tobacco: Never Used  Substance and Sexual Activity  . Alcohol use: Yes    Comment: 5-6/week  . Drug use: No  . Sexual activity: Not on file  Other Topics Concern  . Not on file  Social History Narrative  . Not on file   Social Determinants of Health   Financial Resource Strain: Not on file  Food Insecurity: Not on file  Transportation Needs: Not on file  Physical Activity: Not on file  Stress: Not on file  Social Connections: Not on file  Intimate Partner Violence: Not on file    Current Outpatient Medications on File Prior to Visit  Medication Sig Dispense Refill  . ACCU-CHEK AVIVA PLUS test strip CHECK BLOOD SUGAR FIVE TIMES DAILY 450 strip 0  . Accu-Chek Softclix Lancets lancets Use to check blood sugar 5 times per day. Dx Code: E10.8 100 each 12  . Alcohol Swabs (B-D SINGLE USE SWABS REGULAR) PADS USE AS DIRECTED 100 each 0  . amLODipine (NORVASC) 5 MG tablet Take 1 tablet (5 mg total) by mouth daily. 90 tablet 3  . aspirin EC 81 MG tablet Take 81 mg by mouth daily.    Marland Kitchen atorvastatin (LIPITOR) 40 MG tablet TAKE 1 TABLET ONCE DAILY. 90 tablet 0  . Blood Glucose Monitoring Suppl (  ACCU-CHEK AVIVA PLUS) w/Device KIT USE AS DIRECTED 1 kit 0  . Cyanocobalamin (B-12 COMPLIANCE INJECTION IJ) Inject as directed every 30 (thirty) days.    . DROPLET INSULIN SYRINGE 30G X 1/2" 0.3 ML MISC USE AS DIRECTED FOUR TIMES A DAY 400 each 2  . lisinopril (ZESTRIL) 20 MG tablet Take 1 tablet (20 mg total) by mouth daily. 90 tablet 3  . NOVOLOG 100 UNIT/ML injection INJECT 2 TO 5 UNITS INTO THE SKIN 3 TIMES DAILY WITH MEALS (DISCARD VIAL 28 DAYS AFTER OPENING) 30 mL 3  . sildenafil (VIAGRA) 100 MG tablet Take 1 tablet (100 mg total) by mouth daily as needed for erectile dysfunction. 30 tablet 5  . Vitamin D, Ergocalciferol, (DRISDOL) 1.25 MG (50000 UT) CAPS capsule Take 1 capsule (50,000 Units total) by mouth every 7 (seven) days. 12 capsule 0   No  current facility-administered medications on file prior to visit.    Allergies  Allergen Reactions  . Penicillins     REACTION: unspecified reaction    Family History  Problem Relation Age of Onset  . Cancer Father   . Hypertension Mother   . Varicose Veins Maternal Aunt   . Varicose Veins Maternal Grandmother     BP 130/70 (BP Location: Right Arm, Patient Position: Sitting, Cuff Size: Large)   Pulse 80   Ht $R'5\' 8"'sQ$  (1.727 m)   Wt 207 lb 6.4 oz (94.1 kg)   SpO2 98%   BMI 31.54 kg/m  [id number]  Review of Systems Denies LOC.      Objective:   Physical Exam VITAL SIGNS:  See vs page GENERAL: no distress Pulses: dorsalis pedis intact bilat.   MSK: no deformity of the feet.   CV: 1+ bilat leg edema, and bilat vv's.   Skin:  no ulcer on the feet.  normal color and temp on the feet.  Few mm nodule at the left temporal area.  Neuro: sensation is intact to touch on the feet.   Lab Results  Component Value Date   HGBA1C 8.4 (A) 04/07/2020       Assessment & Plan:  Type 1 DM, with CAD: uncontrolled.   Hypoglycemia, due to insulin: He declines to change the Lantus to QAM.    Patient Instructions  Please continue the the same 2 insulins.   Please let us know if "Semglee" is cheaper than Lantus, so we can change.   check your blood sugar 5 times a day.  vary the time of day when you check, between before the 3 meals, and at bedtime.  also check if you have symptoms of your blood sugar being too high or too low.  please keep a record of the readings and bring it to your next appointment here (or you can bring the meter itself).  You can write it on any piece of paper.  please call us sooner if your blood sugar goes below 70, or if you have a lot of readings over 200.   If you are going to be active, and can anticipate it, skip the prior dose of novolog.  If not, eat a light snack with it.   Please come back for a follow-up appointment in 4 months.

## 2020-04-15 ENCOUNTER — Other Ambulatory Visit: Payer: Self-pay | Admitting: Cardiology

## 2020-05-22 ENCOUNTER — Telehealth: Payer: Self-pay | Admitting: Cardiology

## 2020-05-22 ENCOUNTER — Other Ambulatory Visit: Payer: Self-pay

## 2020-05-22 MED ORDER — ATORVASTATIN CALCIUM 40 MG PO TABS: 1.0000 | ORAL_TABLET | Freq: Every day | ORAL | 0 refills | Status: DC

## 2020-05-22 NOTE — Telephone Encounter (Signed)
*  STAT* If patient is at the pharmacy, call can be transferred to refill team.   1. Which medications need to be refilled? (please list name of each medication and dose if known) atorvastatin (LIPITOR) 40 MG tablet  2. Which pharmacy/location (including street and city if local pharmacy) is medication to be sent to? [place], [address]  3. Do they need a 30 day or 90 day supply? 90 day

## 2020-05-26 ENCOUNTER — Other Ambulatory Visit: Payer: Self-pay | Admitting: Cardiology

## 2020-05-28 NOTE — Progress Notes (Signed)
Cardiology Office Note:    Date:  05/30/2020   ID:  Donald Lopez, DOB [birth year], MRN [id number]  PCP:  Donald Borg, MD  Cardiologist:  None  Electrophysiologist:  None   Referring MD: Donald Borg, MD   Chief Complaint  Patient presents with  . Follow-up    6 months.  . Coronary Artery Disease    History of Present Illness:    Donald Lopez is a 65 y.o. male with a hx of type 1 diabetes, prostate cancer, hyperlipidemia, tobacco use who presents for follow-up.  He was referred by Dr. Jenny Lopez for an evaluation of elevated calcium score, initially seen on 03/05/2019.  Patient underwent a calcium score on 02/07/2019, which was 1397 (97th percentile for age/gender).  Given the significant elevation in calcium score, he underwent a Lexiscan Myoview on 02/28/2019, which showed no evidence of ischemia, EF 61%.  At initial clinic visit, he denied any chest pain or dyspnea.  Reports that he exercises 3-4 times per week, walk 2 to 3 miles carrying 2 pound weights and also does the bicycle or elliptical at the [facility] for 30 minutes.  Denies any exertional symptoms.  Started statin in October 2020.  Never smoked.  Father had MI in 45s.  Mother had CVA in 56s.  Since last clinic visit, he reports that he has been doing well.  Denies any chest pain, dyspnea, syncope, or palpitations.  Reports occasional lightheadedness that corresponds to hypoglycemia.  Reports chronic left ankle swelling from varicose veins.  He exercises by walking his dog about 5 miles per day and also does cardio/weights at [facility] 2-3 times per week for 30 to 45 minutes.  He denies any exertional symptoms.   Past Medical History:  Diagnosis Date  . Cancer [facility])    prostate cancer  . Cancer of prostate with intermediate recurrence risk (stage T2b-c or Gleason 7 or PSA 10-20) ([place]) 07/30/2014  . DIABETES MELLITUS, TYPE I 11/17/2006  . DIABETIC  RETINOPATHY 11/17/2006  . 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 HYPERLIPIDEMIA 11/17/2006  .  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 POLYP, GALLBLADDER 11/17/2006  . 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 s/p PRP OD  s/p PPV with laser and cryo OS 5/10  . TOBACCO USE, QUIT 11/17/2006  . Trigger finger   . VARICOSE VEINS, LOWER EXTREMITIES 11/17/2006    Past Surgical History:  Procedure Laterality Date  . KNEE ARTHROSCOPY     Left  . PROSTATE SURGERY  October 2016   [facility]/ Dr. Georgetta Haber   . SHOULDER SURGERY     Left  . VASECTOMY    . VITRECTOMY  2009   Right    Current Medications: Current Meds  Medication Sig  . ACCU-CHEK AVIVA PLUS test strip CHECK BLOOD SUGAR FIVE TIMES DAILY  . Accu-Chek Softclix Lancets lancets Use to check blood sugar 5 times per day. Dx Code: E10.8  . Alcohol Swabs (B-D SINGLE USE SWABS REGULAR) PADS USE AS DIRECTED  . amLODipine (NORVASC) 5 MG tablet TAKE 1 TABLET EACH DAY.  Marland Kitchen aspirin EC 81 MG tablet Take 81 mg by mouth daily.  Marland Kitchen atorvastatin (LIPITOR) 40 MG tablet Take 1 tablet (40 mg total) by mouth daily.  . Blood Glucose Monitoring Suppl (ACCU-CHEK AVIVA PLUS) w/Device KIT USE AS DIRECTED  . Cyanocobalamin (B-12 COMPLIANCE INJECTION IJ) Inject as directed every 30 (thirty) days.  . DROPLET INSULIN SYRINGE 30G X 1/2"  0.3 ML MISC USE AS DIRECTED FOUR TIMES A DAY  . insulin glargine (LANTUS) 100 UNIT/ML injection Inject 0.38 mLs (38 Units total) into the skin daily.  Marland Kitchen lisinopril (ZESTRIL) 20 MG tablet TAKE 1 TABLET ONCE DAILY.  Marland Kitchen NOVOLOG 100 UNIT/ML injection INJECT 2 TO 5 UNITS INTO THE SKIN 3 TIMES DAILY WITH MEALS (DISCARD VIAL 28 DAYS AFTER OPENING)  . sildenafil (VIAGRA) 100 MG tablet Take 1 tablet (100 mg total) by mouth daily as needed for erectile dysfunction.  Marland Kitchen VITAMIN D, CHOLECALCIFEROL, PO Take 1 capsule by mouth daily.  . [DISCONTINUED] Vitamin D, Ergocalciferol, (DRISDOL) 1.25 MG (50000 UT) CAPS capsule Take 1 capsule (50,000  Units total) by mouth every 7 (seven) days.     Allergies:   Penicillins   Social History   Socioeconomic History  . Marital status: Married    Spouse name: Presenter, broadcasting  . Number of children: 3  . Years of education: Not on file  . Highest education level: Not on file  Occupational History  . Occupation: Airline pilot  Tobacco Use  . Smoking status: Never Smoker  . Smokeless tobacco: Never Used  Substance and Sexual Activity  . Alcohol use: Yes    Comment: 5-6/week  . Drug use: No  . Sexual activity: Not on file  Other Topics Concern  . Not on file  Social History Narrative  . Not on file   Social Determinants of Health   Financial Resource Strain: Not on file  Food Insecurity: Not on file  Transportation Needs: Not on file  Physical Activity: Not on file  Stress: Not on file  Social Connections: Not on file     Family History: The patient's family history includes Cancer in his father; Hypertension in his mother; Varicose Veins in his maternal aunt and maternal grandmother.  ROS:   Please see the history of present illness.     All other systems reviewed and are negative.  EKGs/Labs/Other Studies Reviewed:    The following studies were reviewed today:   EKG:  EKG is ordered today.  The ekg ordered today demonstrates normal sinus rhythm, rate 75, no ST/T abnormality  Recent Labs: 11/23/2019: BUN 13; Creatinine, Ser 0.72; Potassium 4.1; Sodium 142 12/10/2019: ALT 18; Hemoglobin 15.3; Platelets 275.0; TSH 1.00  Recent Lipid Panel    Component Value Date/Time   CHOL 147 12/10/2019 1033   CHOL 152 11/09/2019 1039   TRIG 72.0 12/10/2019 1033   HDL 69.60 12/10/2019 1033   HDL 60 11/09/2019 1039   CHOLHDL 2 12/10/2019 1033   VLDL 14.4 12/10/2019 1033   LDLCALC 63 12/10/2019 1033   LDLCALC 80 11/09/2019 1039   LDLDIRECT 123.2 01/22/2013 1101    Calcium score 02/07/19: Coronary calcium score of 1397. This was 97th percentile for age and sex  matched control. Recommend aggressive risk factor modification. Consider noninvasive ischemic testing.  Lexiscan Myoview 02/28/2019:  The left ventricular ejection fraction is normal (55-65%).  Nuclear stress EF: 61%.  There was no ST segment deviation noted during stress.  The study is normal. No evidence of ischemia.  This is a low risk study.  No prior study for comparison.  Physical Exam:    VS:  BP 130/62 (BP Location: Left Arm, Patient Position: Sitting, Cuff Size: Normal)   Pulse 75   Ht _0  (1.727 m)   Wt 206 lb (93.4 kg)   BMI 31.32 kg/m     Wt Readings from Last 3 Encounters:  05/30/20 206 lb (  93.4 kg)  04/07/20 207 lb 6.4 oz (94.1 kg)  12/10/19 209 lb 9.6 oz (95.1 kg)     GEN:  Well nourished, well developed in no acute distress HEENT: Normal NECK: No JVD CARDIAC: RRR, no murmurs, rubs, gallops RESPIRATORY:  Clear to auscultation without rales, wheezing or rhonchi  ABDOMEN: Soft, non-tender, non-distended MUSCULOSKELETAL:  No edema; No deformity  SKIN: Warm and dry NEUROLOGIC:  Alert and oriented x 3 PSYCHIATRIC:  Normal affect   ASSESSMENT:    1. Coronary artery disease involving native coronary artery of native heart without angina pectoris   2. Essential hypertension   3. Hyperlipidemia, unspecified hyperlipidemia type    PLAN:    Coronary artery disease: calcium score on 02/07/2019 was 1397 (97th percentile for age/gender).  Given the significant elevation in calcium score, he underwent a Lexiscan Myoview on 02/28/2019, which showed no evidence of ischemia, EF 61%.  He denies any anginal symptoms -Continue atorvastatin 40 mg daily, LDL 63 on 12/10/2019 -Continue aspirin 81 mg daily  Hypertension: On lisinopril 20 mg daily and amlodipine 5 mg daily.  Appears controlled  Hyperlipidemia: LDL 63 on 12/10/2019, continue atorvastatin 40 mg daily  Type 1 diabetes: On insulin, follows with endocrinology.  A1c 8.4% on 04/07/2020  RTC in 1  year   Medication Adjustments/Labs and Tests Ordered: Current medicines are reviewed at length with the patient today.  Concerns regarding medicines are outlined above.  Orders Placed This Encounter  Procedures  . EKG 12-Lead   No orders of the defined types were placed in this encounter.   Patient Instructions  Medication Instructions:  Your physician recommends that you continue on your current medications as directed. Please refer to the Current Medication list given to you today.  *If you need a refill on your cardiac medications before your next appointment, please call your pharmacy*  Follow-Up: At [facility], you and your health needs are our priority.  As part of our continuing mission to provide you with exceptional heart care, we have created designated Provider Care Teams.  These Care Teams include your primary Cardiologist (physician) and Advanced Practice Providers (APPs -  Physician Assistants and Nurse Practitioners) who all work together to provide you with the care you need, when you need it.  We recommend signing up for the patient portal called "MyChart".  Sign up information is provided on this After Visit Summary.  MyChart is used to connect with patients for Virtual Visits (Telemedicine).  Patients are able to view lab/test results, encounter notes, upcoming appointments, etc.  Non-urgent messages can be sent to your provider as well.   To learn more about what you can do with MyChart, go to [email or web address].    Your next appointment:   12 month(s)  The format for your next appointment:   In Person  Provider:   Oswaldo Milian, MD      Signed, Donato Heinz, MD  05/30/2020 8:49 AM    [place]

## 2020-05-30 ENCOUNTER — Encounter: Payer: Self-pay | Admitting: Cardiology

## 2020-05-30 ENCOUNTER — Other Ambulatory Visit: Payer: Self-pay

## 2020-05-30 ENCOUNTER — Ambulatory Visit (INDEPENDENT_AMBULATORY_CARE_PROVIDER_SITE_OTHER): Payer: 59 | Admitting: Cardiology

## 2020-05-30 VITALS — BP 130/62 | HR 75 | Ht 68.0 in | Wt 206.0 lb

## 2020-05-30 DIAGNOSIS — I2510 Atherosclerotic heart disease of native coronary artery without angina pectoris: Secondary | ICD-10-CM | POA: Diagnosis not present

## 2020-05-30 DIAGNOSIS — E785 Hyperlipidemia, unspecified: Secondary | ICD-10-CM | POA: Diagnosis not present

## 2020-05-30 DIAGNOSIS — I10 Essential (primary) hypertension: Secondary | ICD-10-CM | POA: Diagnosis not present

## 2020-05-30 NOTE — Patient Instructions (Signed)

## 2020-06-24 ENCOUNTER — Ambulatory Visit: Payer: 59 | Admitting: Physician Assistant

## 2020-08-08 ENCOUNTER — Ambulatory Visit: Payer: 59 | Admitting: Endocrinology

## 2020-08-11 ENCOUNTER — Other Ambulatory Visit: Payer: Self-pay | Admitting: Endocrinology

## 2020-08-18 ENCOUNTER — Telehealth: Payer: Self-pay

## 2020-08-18 ENCOUNTER — Encounter: Payer: Self-pay | Admitting: Internal Medicine

## 2020-08-18 ENCOUNTER — Other Ambulatory Visit: Payer: Self-pay | Admitting: Internal Medicine

## 2020-08-18 MED ORDER — NIRMATRELVIR/RITONAVIR (PAXLOVID)TABLET: 3.0000 | ORAL_TABLET | Freq: Two times a day (BID) | ORAL | 0 refills | Status: AC

## 2020-08-18 NOTE — Telephone Encounter (Signed)
Patient would like Paxlovid rx to be sent to pharmacy:  [id number] [address] [place]  [phone] Fax:  [phone]

## 2020-08-18 NOTE — Addendum Note (Signed)
Addended by: Biagio Borg on: 08/18/2020 04:55 PM   Modules accepted: Orders

## 2020-08-18 NOTE — Telephone Encounter (Signed)
Left patient a voicemail to call office back to let us know what pharmacy he wants the medication to go to as his preferred location is out.

## 2020-08-18 NOTE — Telephone Encounter (Signed)
Ok sent to [facility]  Please ask pt to hold the lipitor for total 8 days when starting the paxlovid thanks

## 2020-08-18 NOTE — Telephone Encounter (Signed)
Ok this is done 

## 2020-08-18 NOTE — Telephone Encounter (Signed)
Please to call pt to ask for an alternative pharmacy, as we found out his pharmacy does not stock paxlovid

## 2020-08-18 NOTE — Telephone Encounter (Signed)
pt has stated he test POS for COVID this morning and is wanting a rx for Paxlovid.  **Pts sxs are running nose, sore throat and cough.

## 2020-08-21 ENCOUNTER — Telehealth: Payer: Self-pay | Admitting: Endocrinology

## 2020-08-21 NOTE — Telephone Encounter (Signed)
MEDICATION: insulin glargine (LANTUS) 100 UNIT/ML injection  PHARMACY:   [facility] - [place], [place] Phone:  [phone]  Fax:  [phone]      HAS THE PATIENT CONTACTED THEIR PHARMACY?  Yes  IS THIS A 90 DAY SUPPLY : Yes  IS PATIENT OUT OF MEDICATION: No  IF NOT; HOW MUCH IS LEFT: Approx. 2 weeks  LAST APPOINTMENT DATE: '@3'$ /21/2022  NEXT APPOINTMENT DATE:'@8'$ /18/2022  DO WE HAVE YOUR PERMISSION TO LEAVE A DETAILED MESSAGE?:Yes  OTHER COMMENTS:    **Let patient know to contact pharmacy at the end of the day to make sure medication is ready. **  ** Please notify patient to allow 48-72 hours to process**  **Encourage patient to contact the pharmacy for refills or they can request refills through [facility]**

## 2020-08-22 ENCOUNTER — Other Ambulatory Visit: Payer: Self-pay

## 2020-08-22 DIAGNOSIS — E108 Type 1 diabetes mellitus with unspecified complications: Secondary | ICD-10-CM

## 2020-08-22 MED ORDER — INSULIN GLARGINE 100 UNIT/ML ~~LOC~~ SOLN: 38.0000 [IU] | Freq: Every day | SUBCUTANEOUS | 11 refills | Status: DC

## 2020-08-22 NOTE — Telephone Encounter (Signed)
Sent to the pharmacy

## 2020-09-04 ENCOUNTER — Ambulatory Visit: Payer: 59 | Admitting: Endocrinology

## 2020-09-05 ENCOUNTER — Ambulatory Visit (INDEPENDENT_AMBULATORY_CARE_PROVIDER_SITE_OTHER): Payer: 59 | Admitting: Endocrinology

## 2020-09-05 ENCOUNTER — Other Ambulatory Visit: Payer: Self-pay

## 2020-09-05 VITALS — BP 146/60 | HR 95 | Ht 68.0 in | Wt 204.0 lb

## 2020-09-05 DIAGNOSIS — E1065 Type 1 diabetes mellitus with hyperglycemia: Secondary | ICD-10-CM | POA: Diagnosis not present

## 2020-09-05 DIAGNOSIS — E108 Type 1 diabetes mellitus with unspecified complications: Secondary | ICD-10-CM

## 2020-09-05 LAB — POCT GLYCOSYLATED HEMOGLOBIN (HGB A1C): Hemoglobin A1C: 8 % — AB (ref 4.0–5.6)

## 2020-09-05 MED ORDER — INSULIN ASPART 100 UNIT/ML IJ SOLN: 2.0000 [IU] | Freq: Three times a day (TID) | INTRAMUSCULAR | 3 refills | Status: DC

## 2020-09-05 NOTE — Patient Instructions (Addendum)
Please take the Novolog 2-7 units 3 times a day (just before each meal), and continue the the same Lantus. check your blood sugar 5 times a day.  vary the time of day when you check, between before the 3 meals, and at bedtime.  also check if you have symptoms of your blood sugar being too high or too low.  please keep a record of the readings and bring it to your next appointment here (or you can bring the meter itself).  You can write it on any piece of paper.  please call us sooner if your blood sugar goes below 70, or if you have a lot of readings over 200.    If you are going to be active, and can anticipate it, skip the prior dose of novolog.  If not, eat a light snack with it.   Please come back for a follow-up appointment in 4-5 months.

## 2020-09-05 NOTE — Progress Notes (Signed)
   Subjective:    Patient ID: Donald Lopez, male    DOB: 13-Apr-1955, 65 y.o.   MRN: [id number]  HPI Pt returns for f/u of diabetes mellitus:  DM type: 1 Dx'ed: Q000111Q Complications: CAD and PDR.  Therapy: insulin since soon after dx.   DKA: never.  Severe hypoglycemia: never.   Pancreatitis: never.   Other: he has declined pump and continuous glucose monitor; he takes multiple daily injections, but emphasizing basal has helped A1c.   Interval history: no cbg record, but states cbg's vary from 66-290.  He has hypoglycemia approx 2/week, and these episodes are mild.  This usually happens before lunch.  It is in general highest fasting.  He say this is due to rebound from hypoglycemia in the middle of the night.     Review of Systems     Objective:   Physical Exam Pulses: dorsalis pedis intact bilat.   MSK: no deformity of the feet.   CV: 1+ bilat leg edema, and bilat vv's.   Skin:  no ulcer on the feet.  normal color and temp on the feet.  Neuro: sensation is intact to touch on the feet.    Lab Results  Component Value Date   CREATININE 0.72 (L) 11/23/2019   BUN 13 11/23/2019   NA 142 11/23/2019   K 4.1 11/23/2019   CL 105 11/23/2019   CO2 23 11/23/2019   A1c=8.0%    Assessment & Plan:  Type 1 DM: uncontrolled Hypoglycemia, due to insulin: this limits aggressiveness of glycemic control.    Patient Instructions  Please take the Novolog 2-7 units 3 times a day (just before each meal), and continue the the same Lantus. check your blood sugar 5 times a day.  vary the time of day when you check, between before the 3 meals, and at bedtime.  also check if you have symptoms of your blood sugar being too high or too low.  please keep a record of the readings and bring it to your next appointment here (or you can bring the meter itself).  You can write it on any piece of paper.  please call us sooner if your blood sugar goes below 70, or if you have a lot of readings over 200.     If you are going to be active, and can anticipate it, skip the prior dose of novolog.  If not, eat a light snack with it.   Please come back for a follow-up appointment in 4-5 months.

## 2020-10-06 ENCOUNTER — Other Ambulatory Visit: Payer: Self-pay | Admitting: Cardiology

## 2020-10-09 ENCOUNTER — Other Ambulatory Visit: Payer: Self-pay | Admitting: Endocrinology

## 2020-10-23 ENCOUNTER — Other Ambulatory Visit: Payer: Self-pay | Admitting: Endocrinology

## 2020-10-23 DIAGNOSIS — E108 Type 1 diabetes mellitus with unspecified complications: Secondary | ICD-10-CM

## 2020-11-24 ENCOUNTER — Other Ambulatory Visit: Payer: Self-pay

## 2020-11-24 ENCOUNTER — Ambulatory Visit (INDEPENDENT_AMBULATORY_CARE_PROVIDER_SITE_OTHER): Payer: 59

## 2020-11-24 DIAGNOSIS — Z23 Encounter for immunization: Secondary | ICD-10-CM

## 2020-11-24 NOTE — Progress Notes (Signed)
Pt here for Flu vaccine  Given IM, and pt tolerated injection well.

## 2020-12-15 ENCOUNTER — Other Ambulatory Visit: Payer: Self-pay

## 2020-12-15 ENCOUNTER — Ambulatory Visit (INDEPENDENT_AMBULATORY_CARE_PROVIDER_SITE_OTHER): Payer: 59 | Admitting: Internal Medicine

## 2020-12-15 ENCOUNTER — Encounter: Payer: Self-pay | Admitting: Internal Medicine

## 2020-12-15 VITALS — BP 168/84 | HR 91 | Temp 98.5°F | Ht 68.0 in | Wt 211.0 lb

## 2020-12-15 DIAGNOSIS — Z0001 Encounter for general adult medical examination with abnormal findings: Secondary | ICD-10-CM | POA: Diagnosis not present

## 2020-12-15 DIAGNOSIS — R030 Elevated blood-pressure reading, without diagnosis of hypertension: Secondary | ICD-10-CM | POA: Diagnosis not present

## 2020-12-15 DIAGNOSIS — E108 Type 1 diabetes mellitus with unspecified complications: Secondary | ICD-10-CM

## 2020-12-15 DIAGNOSIS — I872 Venous insufficiency (chronic) (peripheral): Secondary | ICD-10-CM

## 2020-12-15 DIAGNOSIS — E538 Deficiency of other specified B group vitamins: Secondary | ICD-10-CM

## 2020-12-15 DIAGNOSIS — C61 Malignant neoplasm of prostate: Secondary | ICD-10-CM | POA: Diagnosis not present

## 2020-12-15 DIAGNOSIS — Z23 Encounter for immunization: Secondary | ICD-10-CM

## 2020-12-15 DIAGNOSIS — E559 Vitamin D deficiency, unspecified: Secondary | ICD-10-CM

## 2020-12-15 LAB — HEPATIC FUNCTION PANEL
ALT: 22 U/L (ref 0–53)
AST: 26 U/L (ref 0–37)
Albumin: 4.1 g/dL (ref 3.5–5.2)
Alkaline Phosphatase: 71 U/L (ref 39–117)
Bilirubin, Direct: 0.2 mg/dL (ref 0.0–0.3)
Total Bilirubin: 0.7 mg/dL (ref 0.2–1.2)
Total Protein: 6.8 g/dL (ref 6.0–8.3)

## 2020-12-15 LAB — BASIC METABOLIC PANEL
BUN: 10 mg/dL (ref 6–23)
CO2: 29 mEq/L (ref 19–32)
Calcium: 9.2 mg/dL (ref 8.4–10.5)
Chloride: 102 mEq/L (ref 96–112)
Creatinine, Ser: 0.82 mg/dL (ref 0.40–1.50)
GFR: 93.73 mL/min (ref >60.00)
Glucose, Bld: 183 mg/dL — ABNORMAL HIGH (ref 70–99)
Potassium: 4.9 mEq/L (ref 3.5–5.1)
Sodium: 138 mEq/L (ref 135–145)

## 2020-12-15 LAB — CBC WITH DIFFERENTIAL/PLATELET
Basophils Absolute: 0.1 10*3/uL (ref 0.0–0.1)
Basophils Relative: 1.2 % (ref 0.0–3.0)
Eosinophils Absolute: 0.2 10*3/uL (ref 0.0–0.7)
Eosinophils Relative: 3 % (ref 0.0–5.0)
HCT: 46.6 % (ref 39.0–52.0)
Hemoglobin: 15.7 g/dL (ref 13.0–17.0)
Lymphocytes Relative: 30.4 % (ref 12.0–46.0)
Lymphs Abs: 2.1 10*3/uL (ref 0.7–4.0)
MCHC: 33.7 g/dL (ref 30.0–36.0)
MCV: 93.7 fl (ref 78.0–100.0)
Monocytes Absolute: 0.5 10*3/uL (ref 0.1–1.0)
Monocytes Relative: 6.6 % (ref 3.0–12.0)
Neutro Abs: 4.1 10*3/uL (ref 1.4–7.7)
Neutrophils Relative %: 58.8 % (ref 43.0–77.0)
Platelets: 237 10*3/uL (ref 150.0–400.0)
RBC: 4.97 Mil/uL (ref 4.22–5.81)
RDW: 13.4 % (ref 11.5–15.5)
WBC: 6.9 10*3/uL (ref 4.0–10.5)

## 2020-12-15 LAB — URINALYSIS, ROUTINE W REFLEX MICROSCOPIC
Bilirubin Urine: NEGATIVE
Hgb urine dipstick: NEGATIVE
Ketones, ur: 15 — AB
Leukocytes,Ua: NEGATIVE
Nitrite: NEGATIVE
Specific Gravity, Urine: 1.015 (ref 1.000–1.030)
Total Protein, Urine: NEGATIVE
Urine Glucose: 100 — AB
Urobilinogen, UA: 0.2 (ref 0.0–1.0)
pH: 7 (ref 5.0–8.0)

## 2020-12-15 LAB — PSA: PSA: 0 ng/mL — ABNORMAL LOW (ref 0.10–4.00)

## 2020-12-15 LAB — VITAMIN D 25 HYDROXY (VIT D DEFICIENCY, FRACTURES): VITD: 27.66 ng/mL — ABNORMAL LOW (ref 30.00–100.00)

## 2020-12-15 LAB — LIPID PANEL
Cholesterol: 184 mg/dL (ref 0–200)
HDL: 80.1 mg/dL (ref >39.00)
LDL Cholesterol: 94 mg/dL (ref 0–99)
NonHDL: 104.13
Total CHOL/HDL Ratio: 2
Triglycerides: 52 mg/dL (ref 0.0–149.0)
VLDL: 10.4 mg/dL (ref 0.0–40.0)

## 2020-12-15 LAB — TSH: TSH: 1.34 u[IU]/mL (ref 0.35–5.50)

## 2020-12-15 LAB — HEMOGLOBIN A1C: Hgb A1c MFr Bld: 8.2 % — ABNORMAL HIGH (ref 4.6–6.5)

## 2020-12-15 LAB — MICROALBUMIN / CREATININE URINE RATIO
Creatinine,U: 56.1 mg/dL
Microalb Creat Ratio: 1.4 mg/g (ref 0.0–30.0)
Microalb, Ur: 0.8 mg/dL (ref 0.0–1.9)

## 2020-12-15 LAB — VITAMIN B12: Vitamin B-12: >1550 pg/mL — ABNORMAL HIGH (ref 211–911)

## 2020-12-15 NOTE — Progress Notes (Signed)
Patient ID: Donald Lopez, male   DOB: 1955-09-09, 65 y.o.   MRN: [id number]         Chief Complaint:: wellness exam and elevated bp, obesity, lle varicose veins, dm, prostate ca, low vit d, low b12       HPI:  Donald Lopez is a 65 y.o. male here for wellness exam; declines covid booster, o/w up to date                        Also BP at home < 140/90 but not checked recently, and has gained several lbs. S/p covid infection aug 2022 but fortunately no longer symptoms such as fatigue and sob.  Hard to lose wt, conts to work on being more active.  Has worsening sometimes painful varicose veins to LLE but essentially none large to the RLE.  Pt denies chest pain, increased sob or doe, wheezing, orthopnea, PND, increased LE swelling, palpitations, dizziness or syncope.   Pt denies polydipsia, polyuria, or new focal neuro s/s.  Denies urinary symptoms such as dysuria, frequency, urgency, flank pain, hematuria or n/v, fever, chills.  Conts to follow with endo for DM.  Taking low dose Vit d he thinks 1000 u qd.  Also taking b12 1000 mcg qd for over a yr.  No other new complaints.    Wt Readings from Last 3 Encounters:  12/15/20 211 lb (95.7 kg)  09/05/20 204 lb (92.5 kg)  05/30/20 206 lb (93.4 kg)   BP Readings from Last 3 Encounters:  12/15/20 (!) 168/84  09/05/20 (!) 146/60  05/30/20 130/62   Immunization History  Administered Date(s) Administered   Influenza Whole 11/10/2007, 10/10/2009   Influenza, Quadrivalent, Recombinant, Inj, Pf 10/14/2018   Influenza,inj,Quad PF,6+ Mos 10/09/2012, 11/19/2013, 02/25/2017, 12/10/2019, 11/24/2020   PFIZER(Purple Top)SARS-COV-2 Vaccination 03/24/2019, 04/14/2019, 09/25/2019   PNEUMOCOCCAL CONJUGATE-20 12/15/2020   Pneumococcal Polysaccharide-23 09/10/2011   Td 06/24/2008   Tdap 11/17/2018   Zoster Recombinat (Shingrix) 12/12/2019, 03/10/2020   There are no preventive care reminders to display for this patient.     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 s/p PRP OD  s/p PPV with laser and cryo OS 5/10   TOBACCO USE, QUIT 11/17/2006   Trigger finger    VARICOSE VEINS, LOWER EXTREMITIES 11/17/2006   Past Surgical History:  Procedure Laterality Date   CHOLECYSTECTOMY     KNEE ARTHROSCOPY     Left   PROSTATE SURGERY  10/19/2014   [facility]/ Dr. Georgetta Haber    SHOULDER SURGERY     Left   VASECTOMY     VITRECTOMY  01/19/2007   Right   VITRECTOMY      reports that he has never smoked. He has never used smokeless tobacco. He reports current alcohol use. He reports that he does not use drugs. family history includes Cancer in his father; Hypertension in his mother; Varicose Veins in his maternal aunt and maternal grandmother. Allergies  Allergen Reactions   Penicillins     REACTION:  unspecified reaction   Current Outpatient Medications on File Prior to Visit  Medication Sig Dispense Refill   ACCU-CHEK AVIVA PLUS test strip CHECK BLOOD SUGAR FIVE TIMES DAILY 450 strip 0   Accu-Chek Softclix Lancets lancets Use to check blood sugar 5 times per day. Dx Code: E10.8 100 each 12   Alcohol Swabs (B-D SINGLE USE SWABS REGULAR) PADS USE AS DIRECTED 100 each 0   amLODipine (NORVASC) 5 MG tablet TAKE 1 TABLET EACH DAY. 90 tablet 2   aspirin EC 81 MG tablet Take 81 mg by mouth daily.     atorvastatin (LIPITOR) 40 MG tablet Take 1 tablet (40 mg total) by mouth daily. 90 tablet 3   Blood Glucose Calibration (ACCU-CHEK GUIDE CONTROL) LIQD USE AS DIRECTED WITH GLUCOSE  METER 1 each 3   Blood Glucose Monitoring Suppl (ACCU-CHEK GUIDE) w/Device KIT USE AS DIRECTED 1 kit 0   Cyanocobalamin (B-12 COMPLIANCE INJECTION IJ) Inject as directed every 30 (thirty) days.     DROPLET INSULIN SYRINGE 30G X 1/2" 0.3 ML MISC USE AS DIRECTED FOUR TIMES A DAY 400 each 2   insulin glargine (LANTUS) 100 UNIT/ML injection Inject 0.38 mLs (38 Units total) into the skin daily. 20 mL 11   lisinopril (ZESTRIL) 20 MG tablet TAKE 1 TABLET ONCE DAILY. 90 tablet 3   NOVOLOG 100 UNIT/ML injection INJECT 3-5 UNITS SUBCUTANEOUSLY 3 TIMES DAILY BEFORE MEALS. 10 mL 2   sildenafil (VIAGRA) 100 MG tablet Take 1 tablet (100 mg total) by mouth daily as needed for erectile dysfunction. 30 tablet 5   VITAMIN D, CHOLECALCIFEROL, PO Take 1 capsule by mouth daily.     No current facility-administered medications on file prior to visit.        ROS:  All others reviewed and negative.  Objective        PE:  BP (!) 168/84 (BP Location: Right Arm, Patient Position: Sitting, Cuff Size: Large)   Pulse 91   Temp 98.5 F (36.9 C) (Oral)   Ht $R'5\' 8"'MY$  (1.727 m)   Wt 211 lb (95.7 kg)   SpO2 97%   BMI 32.08 kg/m                 Constitutional: Pt appears in NAD               HENT: Head: NCAT.                Right Ear: External ear normal.                 Left Ear: External ear normal.                Eyes: . Pupils are equal, round, and reactive to light. Conjunctivae and EOM are normal               Nose: without d/c or deformity               Neck: Neck supple. Gross normal ROM               Cardiovascular: Normal rate and regular rhythm.                 Pulmonary/Chest: Effort normal and breath sounds without rales or wheezing.                Abd:  Soft, NT, ND, + BS, no organomegaly               Neurological:  Pt is alert. At baseline orientation, motor grossly intact               Skin: Skin is warm. No rashes, no other new lesions, LE edema - 1= to LLE with large varicosites somewhat tender but  no phlebitis               Psychiatric: Pt behavior is normal without agitation   Micro: none  Cardiac tracings I have personally interpreted today:  none  Pertinent Radiological findings (summarize): none   Lab Results  Component Value Date   WBC 6.9 12/15/2020   HGB 15.7 12/15/2020   HCT 46.6 12/15/2020   PLT 237.0 12/15/2020   GLUCOSE 183 (H) 12/15/2020   CHOL 184 12/15/2020   TRIG 52.0 12/15/2020   HDL 80.10 12/15/2020   LDLDIRECT 123.2 01/22/2013   LDLCALC 94 12/15/2020   ALT 22 12/15/2020   AST 26 12/15/2020   NA 138 12/15/2020   K 4.9 12/15/2020   CL 102 12/15/2020   CREATININE 0.82 12/15/2020   BUN 10 12/15/2020   CO2 29 12/15/2020   TSH 1.34 12/15/2020   PSA 0.00 (L) 12/15/2020   HGBA1C 8.2 (H) 12/15/2020   MICROALBUR 0.8 12/15/2020   Assessment/Plan:  Donald Lopez is a 65 y.o. White or Caucasian [1] male with  has a past medical history of Cancer ([place]), Cancer of prostate with intermediate recurrence risk (stage T2b-c or Gleason 7 or PSA 10-20) ([place]) (07/30/2014), DIABETES MELLITUS, TYPE I (11/17/2006), DIABETIC  RETINOPATHY (11/17/2006), HPV (11/17/2006), HYPERLIPIDEMIA (11/17/2006), Ischemic optic neuropathy, right (01/21/2011), POLYP, GALLBLADDER (11/17/2006), Proliferative diabetic retinopathy(362.02) (08/26/2011), TOBACCO USE, QUIT (11/17/2006), Trigger finger, and VARICOSE VEINS, LOWER EXTREMITIES (11/17/2006).  Cancer of prostate with intermediate recurrence risk (stage T2b-c or Gleason 7 or PSA 10-20) ([facility]) Also for psa, f/u urology as planned  Type 1 diabetes mellitus with complications ([place]) Lab Results  Component Value Date   HGBA1C 8.2 (H) 12/15/2020   uncontrolled, pt to continue current medical treatment *lantus, novolog , declines change in tx, conts to f//u with endo as well   Chronic venous insufficiency With large varicosities LLE with some discomfort, ok for vascular surgury referral  Vitamin D deficiency Last vitamin D Lab Results   Component Value Date   VD25OH 27.66 (L) 12/15/2020   Low, pt to double oral replacement to 2000 u qd (or 4000 if already taking the 2000)  B12 deficiency Lab Results  Component Value Date   VITAMINB12 >1550 (H) 12/15/2020   Was low, now over controlled, for oral replacement - b12 1000 mcg qd reduced to mon - wed - fri  Blood pressure elevated without history of HTN BP Readings from Last 3 Encounters:  12/15/20 (!) 168/84  09/05/20 (!) 146/60  05/30/20 130/62   Uncontrolled   D/w pt, pt to continue medical treatment  - none as declines change today, states BP ok at home  Followup: Return in about 1 year (around 12/15/2021).  Cathlean Cower, MD 12/21/2020 10:00 AM [facility]

## 2020-12-15 NOTE — Patient Instructions (Addendum)
You had the Prevnar 20 pneumonia shot today  Ok to double the [place] to decrease the B12 to mon- wed - fri  Please consider the Novavax covid vaccine instead of the MRNA boosters when it is available  Please continue all other medications as before, and refills have been done if requested.  Please have the pharmacy call with any other refills you may need.  Please continue your efforts at being more active, low cholesterol diet, and weight control.  You are otherwise up to date with prevention measures today.  Please keep your appointments with your specialists as you may have planned - cardiology and endo  Please go to the LAB at the blood drawing area for the tests to be done  You will be contacted by phone if any changes need to be made immediately.  Otherwise, you will receive a letter about your results with an explanation, but please check with MyChart first.  Please remember to sign up for MyChart if you have not done so, as this will be important to you in the future with finding out test results, communicating by private email, and scheduling acute appointments online when needed.  Please make an Appointment to return for your 1 year visit, or sooner if needed, with Lab testing by Appointment as well, to be done about 3-5 days before at the [facility] (so this is for TWO appointments - please see the scheduling desk as you leave)   Due to the ongoing Covid 19 pandemic, our lab now requires an appointment for any labs done at our office.  If you need labs done and do not have an appointment, please call our office ahead of time to schedule before presenting to the lab for your testing.

## 2020-12-16 ENCOUNTER — Encounter: Payer: Self-pay | Admitting: Internal Medicine

## 2020-12-21 ENCOUNTER — Encounter: Payer: Self-pay | Admitting: Internal Medicine

## 2020-12-21 DIAGNOSIS — R030 Elevated blood-pressure reading, without diagnosis of hypertension: Secondary | ICD-10-CM | POA: Insufficient documentation

## 2020-12-21 DIAGNOSIS — E559 Vitamin D deficiency, unspecified: Secondary | ICD-10-CM | POA: Insufficient documentation

## 2020-12-21 DIAGNOSIS — I10 Essential (primary) hypertension: Secondary | ICD-10-CM | POA: Insufficient documentation

## 2020-12-21 DIAGNOSIS — E538 Deficiency of other specified B group vitamins: Secondary | ICD-10-CM | POA: Insufficient documentation

## 2020-12-21 NOTE — Assessment & Plan Note (Signed)
Also for psa, f/u urology as planned

## 2020-12-21 NOTE — Assessment & Plan Note (Signed)
Lab Results  Component Value Date   VITAMINB12 >1550 (H) 12/15/2020   Was low, now over controlled, for oral replacement - b12 1000 mcg qd reduced to mon - wed - fri

## 2020-12-21 NOTE — Assessment & Plan Note (Signed)
BP Readings from Last 3 Encounters:  12/15/20 (!) 168/84  09/05/20 (!) 146/60  05/30/20 130/62   Uncontrolled   D/w pt, pt to continue medical treatment  - none as declines change today, states BP ok at home

## 2020-12-21 NOTE — Assessment & Plan Note (Signed)
With large varicosities LLE with some discomfort, ok for vascular surgury referral

## 2020-12-21 NOTE — Assessment & Plan Note (Signed)
Last vitamin D Lab Results  Component Value Date   VD25OH 27.66 (L) 12/15/2020   Low, pt to double oral replacement to 2000 u qd (or 4000 if already taking the 2000)

## 2020-12-21 NOTE — Assessment & Plan Note (Signed)
Lab Results  Component Value Date   HGBA1C 8.2 (H) 12/15/2020   uncontrolled, pt to continue current medical treatment *lantus, novolog , declines change in tx, conts to f//u with endo as well

## 2020-12-31 ENCOUNTER — Telehealth: Payer: Self-pay | Admitting: Endocrinology

## 2020-12-31 DIAGNOSIS — E108 Type 1 diabetes mellitus with unspecified complications: Secondary | ICD-10-CM

## 2020-12-31 MED ORDER — INSULIN ASPART 100 UNIT/ML IJ SOLN: INTRAMUSCULAR | 2 refills | Status: DC

## 2020-12-31 NOTE — Telephone Encounter (Signed)
RX now sent ?

## 2020-12-31 NOTE — Telephone Encounter (Signed)
MEDICATION: NOVOLOG 100 UNIT/ML injection  PHARMACY:   [facility], [place] Phone:  [phone]  Fax:  [phone]      HAS THE PATIENT CONTACTED THEIR PHARMACY?  YES  IS THIS A 90 DAY SUPPLY : YES  IS PATIENT OUT OF MEDICATION: NO  IF NOT; HOW MUCH IS LEFT: 2 Vials  LAST APPOINTMENT DATE: @10 /06/2020  NEXT APPOINTMENT DATE:@1 /20/2023  DO WE HAVE YOUR PERMISSION TO LEAVE A DETAILED MESSAGE?: Yes  OTHER COMMENTS:    **Let patient know to contact pharmacy at the end of the day to make sure medication is ready. **  ** Please notify patient to allow 48-72 hours to process**  **Encourage patient to contact the pharmacy for refills or they can request refills through [facility]**

## 2021-01-02 ENCOUNTER — Ambulatory Visit: Payer: 59 | Admitting: Endocrinology

## 2021-01-05 MED ORDER — INSULIN ASPART 100 UNIT/ML IJ SOLN: INTRAMUSCULAR | 2 refills | Status: DC

## 2021-01-05 NOTE — Addendum Note (Signed)
Addended by: Sarina Ill on: 01/05/2021 01:09 PM   Modules accepted: Orders

## 2021-01-05 NOTE — Telephone Encounter (Signed)
Order has now been adjusted to 90 day supply and sent to [facility]

## 2021-01-05 NOTE — Telephone Encounter (Signed)
Patient called to advise that his request for a refill was for 90 days of Novolog to be sent to [facility] - please see original dated 12/31/20.   This request is because at this point in his insurance year a 90 day supply will be free.    We sent 30 day refill to [facility] instead of 90 days to [facility]

## 2021-01-13 ENCOUNTER — Other Ambulatory Visit: Payer: Self-pay | Admitting: Endocrinology

## 2021-01-13 DIAGNOSIS — E108 Type 1 diabetes mellitus with unspecified complications: Secondary | ICD-10-CM

## 2021-02-06 ENCOUNTER — Ambulatory Visit: Payer: 59 | Admitting: Endocrinology

## 2021-02-17 ENCOUNTER — Other Ambulatory Visit: Payer: Self-pay | Admitting: Cardiology

## 2021-02-17 NOTE — Telephone Encounter (Signed)
This is Dr. Schumann's pt 

## 2021-02-19 LAB — HM DIABETES EYE EXAM

## 2021-02-27 ENCOUNTER — Other Ambulatory Visit: Payer: Self-pay

## 2021-02-27 ENCOUNTER — Ambulatory Visit (INDEPENDENT_AMBULATORY_CARE_PROVIDER_SITE_OTHER): Payer: 59 | Admitting: Endocrinology

## 2021-02-27 VITALS — BP 150/70 | HR 91 | Ht 68.0 in | Wt 207.0 lb

## 2021-02-27 DIAGNOSIS — E108 Type 1 diabetes mellitus with unspecified complications: Secondary | ICD-10-CM

## 2021-02-27 LAB — POCT GLYCOSYLATED HEMOGLOBIN (HGB A1C): Hemoglobin A1C: 8 % — AB (ref 4.0–5.6)

## 2021-02-27 MED ORDER — INSULIN ASPART 100 UNIT/ML IJ SOLN: INTRAMUSCULAR | 2 refills | Status: DC

## 2021-02-27 MED ORDER — INSULIN GLARGINE 100 UNIT/ML ~~LOC~~ SOLN: 41.0000 [IU] | Freq: Every day | SUBCUTANEOUS | 3 refills | Status: DC

## 2021-02-27 NOTE — Progress Notes (Signed)
Subjective:    Patient ID: Donald Lopez, male    DOB: Nov 01, 1955, 66 y.o.   MRN: [id number]  HPI Pt returns for f/u of diabetes mellitus:  DM type: 1 Dx'ed: 1985 Complications: CAD and PDR.  Therapy: insulin since soon after dx.   DKA: never.  Severe hypoglycemia: never.   Pancreatitis: never.   Other: he has declined pump and continuous glucose monitor; he takes multiple daily injections, but emphasizing basal has helped A1c.   Interval history: no cbg record, but states cbg's vary from 67-300.  He still has hypoglycemia approx 2/week, and these episodes are mild.  This usually happens before lunch.  It is in general highest fasting.  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 s/p PRP OD  s/p PPV with laser and cryo OS 5/10   TOBACCO USE, QUIT 11/17/2006   Trigger finger    VARICOSE VEINS, LOWER EXTREMITIES 11/17/2006    Past Surgical History:  Procedure Laterality Date   CHOLECYSTECTOMY     KNEE ARTHROSCOPY     Left   PROSTATE SURGERY  10/19/2014   [facility]/ Dr. Ivin Booty    SHOULDER SURGERY     Left   VASECTOMY     VITRECTOMY  01/19/2007   Right   VITRECTOMY      Social History   Socioeconomic History   Marital status: Married    Spouse name: Education officer, environmental   Number of children: 3   Years of education: Not on file   Highest education level: Not on file  Occupational History   Occupation: Research officer, political party  Tobacco Use   Smoking status: Never   Smokeless tobacco: Never  Substance and  Sexual Activity   Alcohol use: Yes    Comment: 5-6/week   Drug use: No   Sexual activity: Not on file  Other Topics Concern   Not on file  Social History Narrative   Not on file   Social Determinants of Health   Financial Resource Strain: Not on file  Food Insecurity: Not on file  Transportation Needs: Not on file  Physical Activity: Not on file  Stress: Not on file  Social Connections: Not on file  Intimate Partner Violence: Not on file    Current Outpatient Medications on File Prior to Visit  Medication Sig Dispense Refill   ACCU-CHEK GUIDE test strip CHECK BLOOD SUGAR FIVE TIMES DAILY 450 strip 0   Accu-Chek Softclix Lancets lancets Use to check blood sugar 5 times per day. Dx Code: E10.8 100 each 12   Alcohol Swabs (B-D SINGLE USE SWABS REGULAR) PADS USE AS DIRECTED 100 each 0   amLODipine (NORVASC) 5 MG tablet TAKE ONE TABLET BY MOUTH ONCE DAILY. you will need appointment FOR future refills.thank you 90 tablet 2   aspirin EC 81 MG tablet Take 81 mg by mouth daily.     atorvastatin (LIPITOR) 40 MG tablet Take 1 tablet (40 mg total) by mouth daily. 90 tablet 3   Blood Glucose Calibration (ACCU-CHEK GUIDE CONTROL) LIQD USE AS DIRECTED  WITH GLUCOSE METER 1 each 3   Blood Glucose Monitoring Suppl (ACCU-CHEK GUIDE) w/Device KIT USE AS DIRECTED 1 kit 0   Cyanocobalamin (B-12 COMPLIANCE INJECTION IJ) Inject as directed every 30 (thirty) days.     DROPLET INSULIN SYRINGE 30G X 1/2" 0.3 ML MISC USE AS DIRECTED FOUR TIMES A DAY 400 each 2   lisinopril (ZESTRIL) 20 MG tablet TAKE 1 TABLET ONCE DAILY. 90 tablet 3   sildenafil (VIAGRA) 100 MG tablet Take 1 tablet (100 mg total) by mouth daily as needed for erectile dysfunction. 30 tablet 5   VITAMIN D, CHOLECALCIFEROL, PO Take 1 capsule by mouth daily.     No current facility-administered medications on file prior to visit.    Allergies  Allergen Reactions   Penicillins     REACTION: unspecified reaction    Family History   Problem Relation Age of Onset   Cancer Father    Hypertension Mother    Varicose Veins Maternal Aunt    Varicose Veins Maternal Grandmother     BP (!) 150/70    Pulse 91    Ht $R'5\' 8"'UJ$  (1.727 m)    Wt 207 lb (93.9 kg)    SpO2 97%    BMI 31.47 kg/m    Review of Systems     Objective:   Physical Exam    A1c=8.0%    Assessment & Plan:  Type 1 DM: uncontrolled Hypoglycemia, due to insulin.   Patient Instructions  Please take the Novolog 2-7 units 3 times a day (just before each meal), and continue the the same Lantus. check your blood sugar 5 times a day.  vary the time of day when you check, between before the 3 meals, and at bedtime.  also check if you have symptoms of your blood sugar being too high or too low.  please keep a record of the readings and bring it to your next appointment here (or you can bring the meter itself).  You can write it on any piece of paper.  please call us sooner if your blood sugar goes below 70, or if you have a lot of readings over 200.   If you are going to be active, and can anticipate it, skip the prior dose of novolog.  If not, eat a light snack with it.   I have sent a prescription to your pharmacy, to increase the Lantus to 41 units at bedtime.   Try taking just 2 units of Novolog with breakfast, to avoid the hypoglycemia at lunch.   Please come back for a follow-up appointment in 3 months.

## 2021-02-27 NOTE — Patient Instructions (Addendum)
Please take the Novolog 2-7 units 3 times a day (just before each meal), and continue the the same Lantus. check your blood sugar 5 times a day.  vary the time of day when you check, between before the 3 meals, and at bedtime.  also check if you have symptoms of your blood sugar being too high or too low.  please keep a record of the readings and bring it to your next appointment here (or you can bring the meter itself).  You can write it on any piece of paper.  please call us sooner if your blood sugar goes below 70, or if you have a lot of readings over 200.   If you are going to be active, and can anticipate it, skip the prior dose of novolog.  If not, eat a light snack with it.   I have sent a prescription to your pharmacy, to increase the Lantus to 41 units at bedtime.   Try taking just 2 units of Novolog with breakfast, to avoid the hypoglycemia at lunch.   Please come back for a follow-up appointment in 3 months.

## 2021-03-20 ENCOUNTER — Encounter: Payer: Self-pay | Admitting: Internal Medicine

## 2021-05-27 ENCOUNTER — Ambulatory Visit: Payer: 59 | Admitting: Physician Assistant

## 2021-06-05 ENCOUNTER — Ambulatory Visit: Payer: 59 | Admitting: Endocrinology

## 2021-06-11 ENCOUNTER — Other Ambulatory Visit: Payer: Self-pay | Admitting: Cardiology

## 2021-06-16 LAB — COLOGUARD: COLOGUARD: NEGATIVE

## 2021-08-24 ENCOUNTER — Other Ambulatory Visit: Payer: 59

## 2021-08-31 ENCOUNTER — Other Ambulatory Visit: Payer: 59

## 2021-09-04 ENCOUNTER — Ambulatory Visit: Payer: 59 | Admitting: Endocrinology

## 2021-09-20 ENCOUNTER — Other Ambulatory Visit: Payer: Self-pay | Admitting: Endocrinology

## 2021-09-20 DIAGNOSIS — E108 Type 1 diabetes mellitus with unspecified complications: Secondary | ICD-10-CM

## 2021-09-24 ENCOUNTER — Other Ambulatory Visit (INDEPENDENT_AMBULATORY_CARE_PROVIDER_SITE_OTHER): Payer: BC Managed Care – PPO

## 2021-09-24 DIAGNOSIS — E108 Type 1 diabetes mellitus with unspecified complications: Secondary | ICD-10-CM

## 2021-09-24 LAB — HEMOGLOBIN A1C: Hgb A1c MFr Bld: 8.5 % — ABNORMAL HIGH (ref 4.6–6.5)

## 2021-09-24 LAB — GLUCOSE, RANDOM: Glucose, Bld: 110 mg/dL — ABNORMAL HIGH (ref 70–99)

## 2021-09-24 LAB — MICROALBUMIN / CREATININE URINE RATIO
Creatinine,U: 152.7 mg/dL
Microalb Creat Ratio: 0.5 mg/g (ref 0.0–30.0)
Microalb, Ur: 0.7 mg/dL (ref 0.0–1.9)

## 2021-10-01 NOTE — Progress Notes (Unsigned)
Patient ID: Donald Lopez, male   DOB: 05/14/1955, 66 y.o.   MRN: [id number]    Chief complaint : Follow up of Type 1 Diabetes  History of Present Illness:          Date of diagnosis:    1985  Prior history: He has been on multiple injections since diagnosis A1c in the last few years has ranged between 7.9 and 8.7  INSULIN regimen : Lantus 25 units in the morning--15 at night NovoLog 3 to 5 units mostly at meals  Recent history:     He has been on Lantus and NovoLog for several years without any changes Currently taking Lantus twice a day with larger doses in the morning Currently taking NovoLog based partly on blood sugar reading before the meals and not with carbohydrate counting He generally takes only 3 units of NovoLog for breakfast and 5 units at dinnertime He says he is trying to eat more salads and usually no carbohydrates at lunch and may not take any insulin to cover his lunch Previously had been reluctant to try continue glucose monitoring and still does not want this  Fasting glucose: He thinks this is quite variable and may be near normal or sometimes over 200 Overnight he may have occasional low sugars about twice a week but may or may not have symptoms at that time Postprandial glucoses tend to be high after dinner anything this is from more snacks in the evenings Hyperglycemia appears to be occurring at most times he regularly except blood sugars are generally better before lunch Hypoglycemia: Occurring occasionally overnight but not frequently during the day     Glucose monitoring:  done 4-5x times a day         Glucometer: Accu-Chek, did not bring monitor for download  Blood Glucose reading summary from recall  PREMEAL Breakfast Lunch Dinner Bedtime Overall  Glucose range: 120-250 120 150 175   Median:        Symptoms of hypoglycemia: Blurred vision, sweating Treatment of hypoglycemia: Juices                Exercise: Elliptical or other exercise either  morning or evening Does not always check blood sugar before exercising, blood sugar may come down somewhat after exercise .          Most recent dietitian/nurse educator visit :        Several years ago  Retinal exams, Most recent:    Diabetes labs:  Lab Results  Component Value Date   HGBA1C 8.5 (H) 09/24/2021   HGBA1C 8.0 (A) 02/27/2021   HGBA1C 8.2 (H) 12/15/2020   Lab Results  Component Value Date   MICROALBUR 0.7 09/24/2021   LDLCALC 94 12/15/2020   CREATININE 0.82 12/15/2020    Office Visit on 10/02/2021  Component Date Value Ref Range Status   POC Glucose 10/02/2021 160 (A)  70 - 99 mg/dl Final    Allergies as of 10/02/2021       Reactions   Penicillins    REACTION: unspecified reaction        Medication List        Accurate as of October 02, 2021 10:45 AM. If you have any questions, ask your nurse or doctor.          Accu-Chek Guide Control Liqd USE AS DIRECTED WITH GLUCOSE METER   Accu-Chek Guide test strip Generic drug: glucose blood CHECK BLOOD SUGAR FIVE TIMES DAILY   Accu-Chek Guide w/Device Kit USE   AS DIRECTED   Accu-Chek Softclix Lancets lancets Use to check blood sugar 5 times per day. Dx Code: E10.8   amLODipine 5 MG tablet Commonly known as: NORVASC TAKE ONE TABLET BY MOUTH ONCE DAILY. you will need appointment FOR future refills.thank you   aspirin EC 81 MG tablet Take 81 mg by mouth daily.   atorvastatin 40 MG tablet Commonly known as: LIPITOR Take 1 tablet (40 mg total) by mouth daily.   B-12 COMPLIANCE INJECTION IJ Inject as directed every 30 (thirty) days.   B-D SINGLE USE SWABS REGULAR Pads USE AS DIRECTED   Droplet Insulin Syringe 30G X 1/2" 0.3 ML Misc Generic drug: Insulin Syringe-Needle U-100 USE AS DIRECTED FOUR TIMES A DAY   insulin aspart 100 UNIT/ML injection Commonly known as: NovoLOG 2-5 UNITS SUBCUTANEOUSLY 3 TIMES DAILY BEFORE MEALS.   insulin glargine 100 UNIT/ML injection Commonly known as:  Lantus Inject 0.41 mLs (41 Units total) into the skin daily. What changed:  how much to take additional instructions   lisinopril 20 MG tablet Commonly known as: ZESTRIL TAKE ONE TABLET ONCE DAILY   sildenafil 100 MG tablet Commonly known as: VIAGRA Take 1 tablet (100 mg total) by mouth daily as needed for erectile dysfunction.   VITAMIN D (CHOLECALCIFEROL) PO Take 1 capsule by mouth daily.        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 s/p PRP OD  s/p PPV with laser and cryo OS 5/10   TOBACCO USE, QUIT 11/17/2006   Trigger finger    VARICOSE VEINS, LOWER EXTREMITIES 11/17/2006    Past Surgical History:  Procedure Laterality Date   CHOLECYSTECTOMY     KNEE ARTHROSCOPY     Left   PROSTATE SURGERY  10/19/2014   [facility]/ Dr. Georgetta Haber    SHOULDER SURGERY     Left   VASECTOMY     VITRECTOMY  01/19/2007   Right   VITRECTOMY      Family History  Problem Relation Age of Onset   Cancer Father    Hypertension Mother    Varicose Veins Maternal Aunt    Varicose Veins Maternal Grandmother     Social History:  reports that he has never smoked. He has never used smokeless tobacco. He reports current alcohol use. He reports that he does not use drugs.    Review of Systems:   Blood pressure: Controlled with lisinopril 20 mg prescribed by his cardiologist Last urine microalbumin normal in  9/23  Diabetic retinopathy: Followed regularly for proliferative retinopathy and history of vitreous hemorrhage Last visit 2/23  Last foot exam 11/22 by PCP  Lipids: Last LDL 94, treated with 40 mg atorvastatin by cardiologist Calcium scoring result was in the 97th percentile  Diabetes complications: Retinopathy, no symptoms of neuropathy   Physical Examination:  BP 122/62   Pulse 81   Ht 5' 8" (1.727 m)   Wt 208 lb 6.4 oz (94.5  kg)   SpO2 97%   BMI 31.69 kg/m         ASSESSMENT/PLAN   Diabetes type 1 with consistently poor control   Problems identified: Consistently poor control with A1c usually over 8% High variability in blood sugars especially overnight Inadequate knowledge of diabetes management especially mealtime insulin adjustment Currently not using correction factor or carbohydrate counting Appears to have variable action of Lantus insulin and likely overnight hypoglycemia frequently Not using continuous glucose monitoring    RECOMMENDATIONS:  Change LANTUS to Tresiba 36 U daily and adjust based on am sugar 2x weekly Given flowsheet with instructions to adjust the dose up or down 2 units to keep blood sugars in the mornings in the 90-130 range mostly He can take this in the morning  Check blood sugars 4 times a day before/after meals  Recommended blood sugar levels on waking up are 90-130 and about 2 hours after meal is 130-180  Needs to bring your blood sugar monitor to each visit,  Discussed the need for continuous glucose monitoring for more complete evaluation his blood sugar patterns, help with adjusting insulin, titrating hypoglycemia orally as well as better control of postprandial readings He will be using the Dexcom G7 sensor which was prescribed and patient information given He will ask the nurse educator for help with starting if needed  He is also a good candidate for insulin pump and discussed the benefits of closed-loop insulin systems,  he will discuss further options with diabetes educator  For high blood sugars use correction factor 1 unit per 50 over target   LIPIDS: Continue follow-up with cardiologist  Patient Instructions  Tresiba 36 U daily and adjust based on am sugar 2x weekly  Check blood sugars on waking up  Also check blood sugars about 2 hours after meals and do this after different meals by rotation  Recommended blood sugar levels on waking up are 90-130 and about 2 hours after meal is 130-180  Please bring your blood sugar monitor to each visit, thank you  Correction factor 1 unit per 50 over target                          Total visit time including counseling = 35 minutes  Ajay Kumar 10/02/2021, 10:45 AM   

## 2021-10-02 ENCOUNTER — Ambulatory Visit: Payer: BC Managed Care – PPO | Admitting: Endocrinology

## 2021-10-02 ENCOUNTER — Encounter: Payer: Self-pay | Admitting: Endocrinology

## 2021-10-02 VITALS — BP 122/62 | HR 81 | Ht 68.0 in | Wt 208.4 lb

## 2021-10-02 DIAGNOSIS — E1065 Type 1 diabetes mellitus with hyperglycemia: Secondary | ICD-10-CM | POA: Diagnosis not present

## 2021-10-02 LAB — POCT GLUCOSE (DEVICE FOR HOME USE): POC Glucose: 160 mg/dl — AB (ref 70–99)

## 2021-10-02 NOTE — Patient Instructions (Addendum)
Tresiba 36 U daily and adjust based on am sugar 2x weekly  Check blood sugars on waking up  Also check blood sugars about 2 hours after meals and do this after different meals by rotation  Recommended blood sugar levels on waking up are 90-130 and about 2 hours after meal is 130-180  Please bring your blood sugar monitor to each visit, thank you  Correction factor 1 unit per 50 over target

## 2021-10-06 ENCOUNTER — Telehealth: Payer: Self-pay

## 2021-10-06 DIAGNOSIS — E1065 Type 1 diabetes mellitus with hyperglycemia: Secondary | ICD-10-CM

## 2021-10-06 NOTE — Telephone Encounter (Signed)
Patient called in about [place]. He would like a prescription sent in. Please verify if U-100 or U-200?

## 2021-10-08 DIAGNOSIS — H4311 Vitreous hemorrhage, right eye: Secondary | ICD-10-CM | POA: Diagnosis not present

## 2021-10-08 DIAGNOSIS — E103593 Type 1 diabetes mellitus with proliferative diabetic retinopathy without macular edema, bilateral: Secondary | ICD-10-CM | POA: Diagnosis not present

## 2021-10-08 DIAGNOSIS — H2513 Age-related nuclear cataract, bilateral: Secondary | ICD-10-CM | POA: Diagnosis not present

## 2021-10-08 DIAGNOSIS — H47011 Ischemic optic neuropathy, right eye: Secondary | ICD-10-CM | POA: Diagnosis not present

## 2021-10-08 MED ORDER — TRESIBA FLEXTOUCH 200 UNIT/ML ~~LOC~~ SOPN: PEN_INJECTOR | SUBCUTANEOUS | 2 refills | Status: DC

## 2021-10-08 NOTE — Addendum Note (Signed)
Addended by: Cinda Quest on: 10/08/2021 09:00 AM   Modules accepted: Orders

## 2021-10-13 ENCOUNTER — Other Ambulatory Visit: Payer: Self-pay

## 2021-10-13 DIAGNOSIS — E1065 Type 1 diabetes mellitus with hyperglycemia: Secondary | ICD-10-CM

## 2021-10-13 MED ORDER — PEN NEEDLES 33G X 4 MM MISC: 1 refills | Status: DC

## 2021-10-19 ENCOUNTER — Other Ambulatory Visit: Payer: Self-pay | Admitting: Cardiology

## 2021-10-23 ENCOUNTER — Other Ambulatory Visit: Payer: Self-pay

## 2021-10-23 DIAGNOSIS — E1065 Type 1 diabetes mellitus with hyperglycemia: Secondary | ICD-10-CM

## 2021-10-23 MED ORDER — ACCU-CHEK AVIVA PLUS VI STRP
ORAL_STRIP | 3 refills | Status: AC
Start: 2021-10-23 — End: ?

## 2021-11-12 ENCOUNTER — Telehealth: Payer: Self-pay

## 2021-11-12 NOTE — Telephone Encounter (Signed)
Error

## 2021-11-16 ENCOUNTER — Ambulatory Visit: Payer: BC Managed Care – PPO | Admitting: Cardiology

## 2021-11-23 ENCOUNTER — Other Ambulatory Visit: Payer: Self-pay | Admitting: Cardiology

## 2021-11-30 ENCOUNTER — Encounter: Payer: BC Managed Care – PPO | Attending: Internal Medicine | Admitting: Nutrition

## 2021-11-30 DIAGNOSIS — E108 Type 1 diabetes mellitus with unspecified complications: Secondary | ICD-10-CM | POA: Insufficient documentation

## 2021-12-01 NOTE — Patient Instructions (Addendum)
Change sensor every 14 days  Take Tresiba 36u once daily.  Increase dose by 2u q 3 days until morning blood sugars are less than 130 Call if interested in try the Cequr device.

## 2021-12-01 NOTE — Progress Notes (Signed)
Patient reports that he started the [place], but blood sugars were in the 200s, and he went back on the Lantus.  Now taking 28 AM, and 12PM.  Says FBS today was 129,  He reports not having adjusted the [place] dose at all.  We discussed the advantages of this insulin over the Lantus insulin, and how to adjust the insulin dose, per Dr. Ronnie Derby orders.   He also reports taking Novolog insulin-2-5u ac meals, but admits to not taking it sometimes when eating out, or snacking late at night  He was shown the [place], and he liked this.  He was given a brochure and will call them to see what it will cost him.  He was told that we can give him a sample for a week to try to see if he likes it, of ok with Dr. Dwyane Dee when he returns from vacation.   He was shown to two CGM systems and he chose the [place].  He was given a Libre 3.  The app was downloaded to his phone and this was linked to [place].    A sensor was inserted into his right upper, outer arm and started at 9:30AM.  Lot #: [id number] Exp. 02/17/22.  We discussed the difference between sensor readings and blood glucose readings and when he will need to do a finger stick blood sugar readings.  He reported good understanding of this and had no questions.   He has agreed to restart the [place] and to follow the directions for insulin adjustments until FBSs are all less than 130.  He had no final questions.

## 2021-12-07 ENCOUNTER — Encounter: Payer: Self-pay | Admitting: Endocrinology

## 2021-12-07 DIAGNOSIS — E1065 Type 1 diabetes mellitus with hyperglycemia: Secondary | ICD-10-CM

## 2021-12-07 MED ORDER — FREESTYLE LIBRE 3 SENSOR MISC: 1.0000 | 5 refills | Status: DC

## 2021-12-28 ENCOUNTER — Encounter: Payer: 59 | Admitting: Internal Medicine

## 2022-01-01 IMAGING — CT CT HEART SCORING
2 series · 16 of 20 positions shown, 18 images · non-contrast
Comparison: None.
COMPARISON: None.

Addendum:
EXAM:
OVER-READ INTERPRETATION  CT CHEST

The following report is an over-read performed by radiologist Dr.
Dafne Magno [REDACTED] on 02/07/2019. This
over-read does not include interpretation of cardiac or coronary
anatomy or pathology. The coronary calcium interpretation by the
cardiologist is attached.
CLINICAL DATA: Risk stratification
Coronary Calcium Score
TECHNIQUE: The patient was scanned on a Siemens Force scanner. Axial
non-contrast 3 mm slices were carried out through the heart. The
data set was analyzed on a dedicated work station and scored using
the Agatson method.

[Series 3: casc 3.0 i36f 2 bestdiast 61 % · axial · 0.36mm/px · z∈[-230,-125]mm · 8 of 46 slices shown, 10 images]
[im 6/46  vessel]
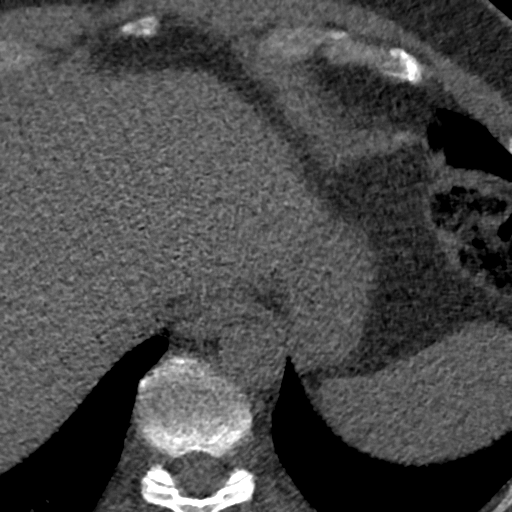
[im 6/46  lung]
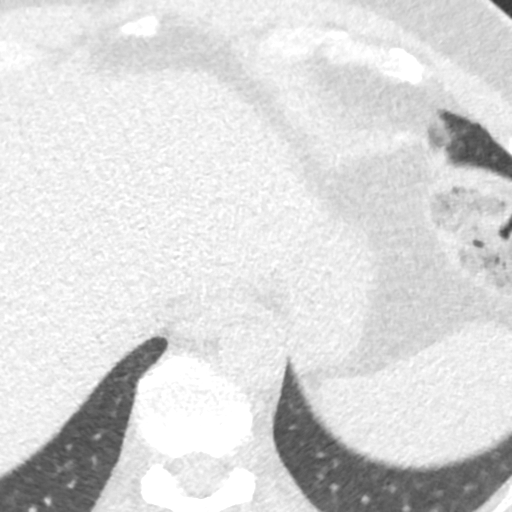
[im 11/46  vessel]
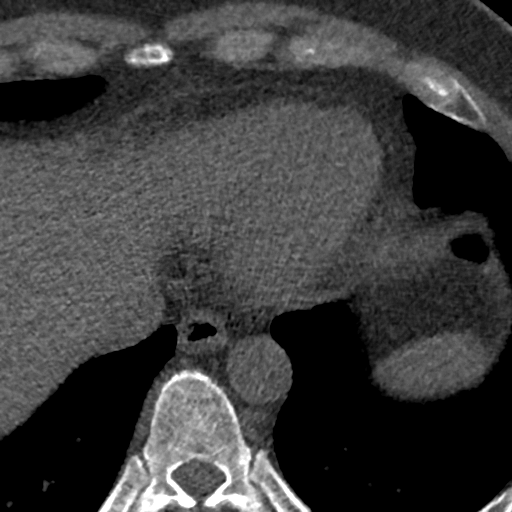
[im 16/46  vessel]
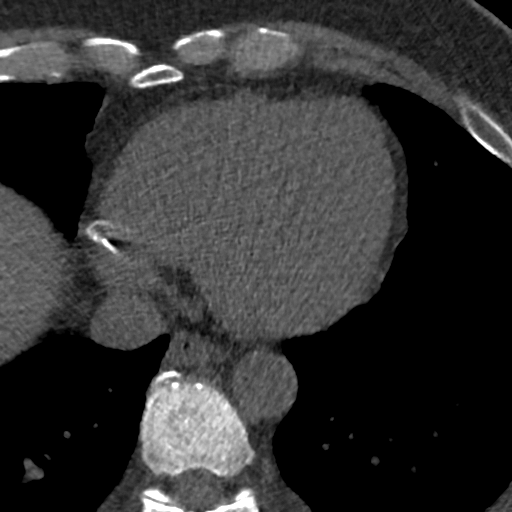
[im 21/46  vessel]
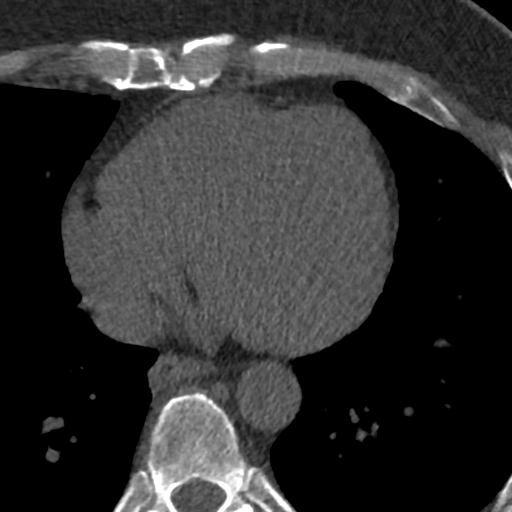
[im 26/46  vessel]
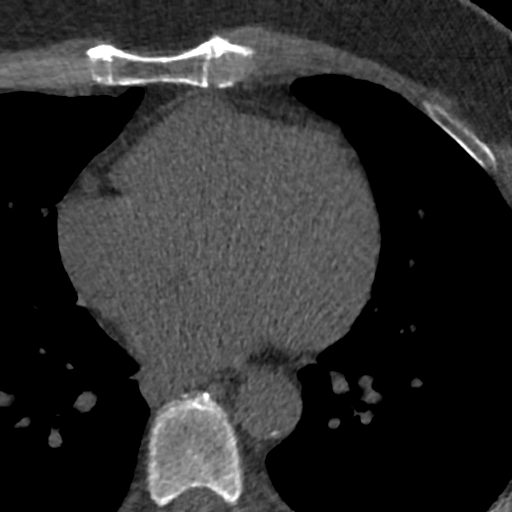
[im 26/46  lung]
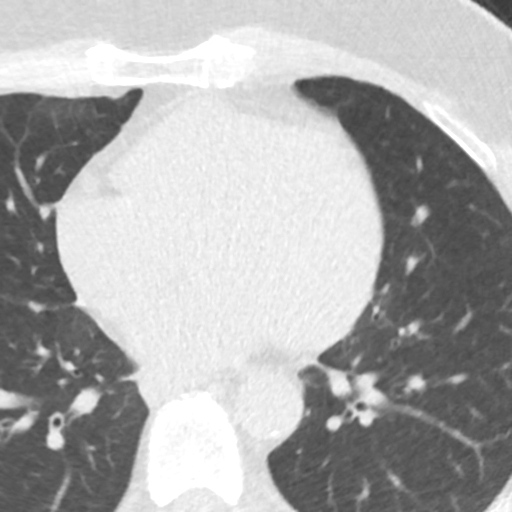
[im 31/46  vessel]
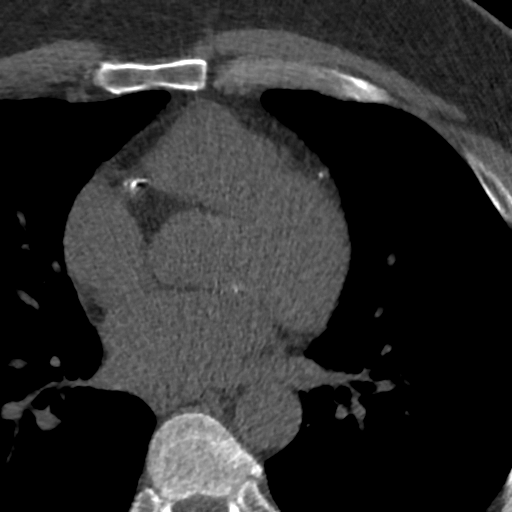
[im 36/46  vessel]
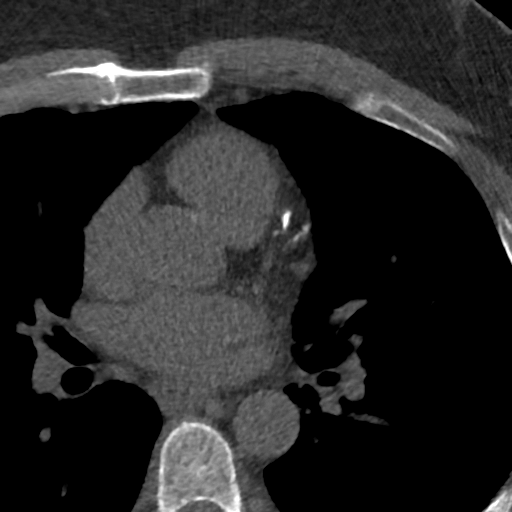
[im 41/46  vessel]
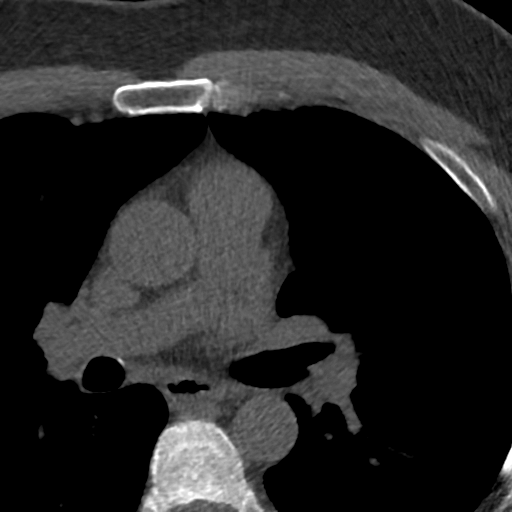

[Series 5: lung st 64 % · axial · 0.67mm/px · z∈[-230,-125]mm · 8 of 46 slices shown]
[im 6/46  lung]
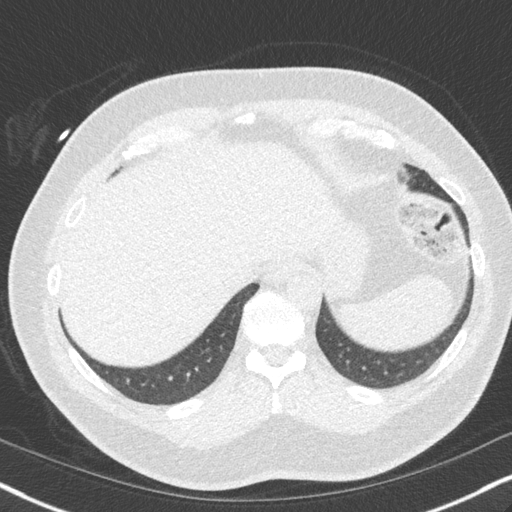
[im 11/46  lung]
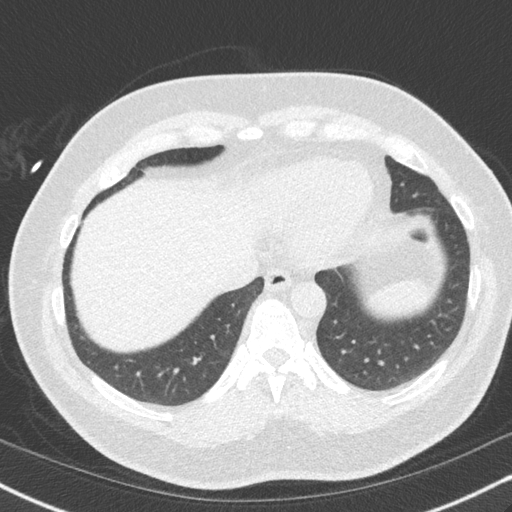
[im 16/46  lung]
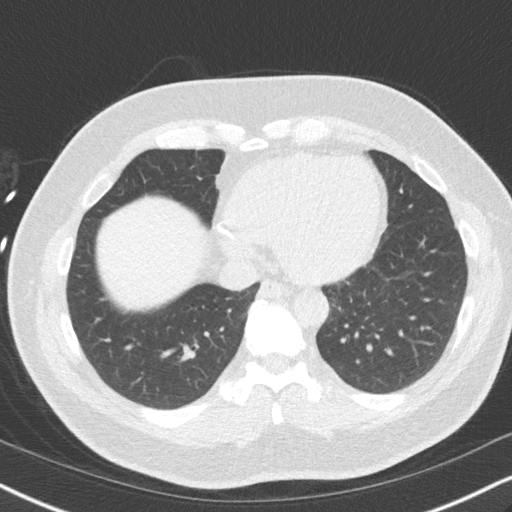
[im 21/46  lung]
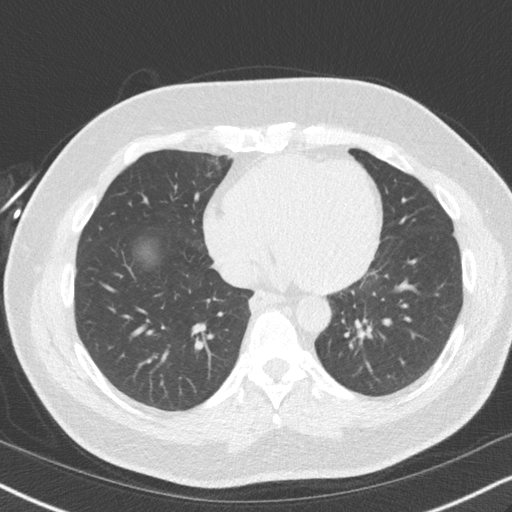
[im 26/46  lung]
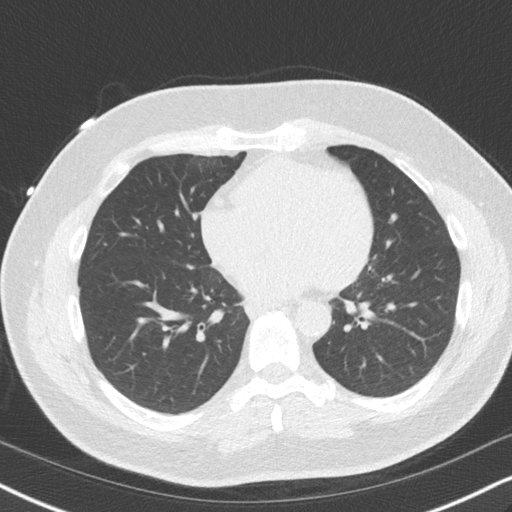
[im 31/46  lung]
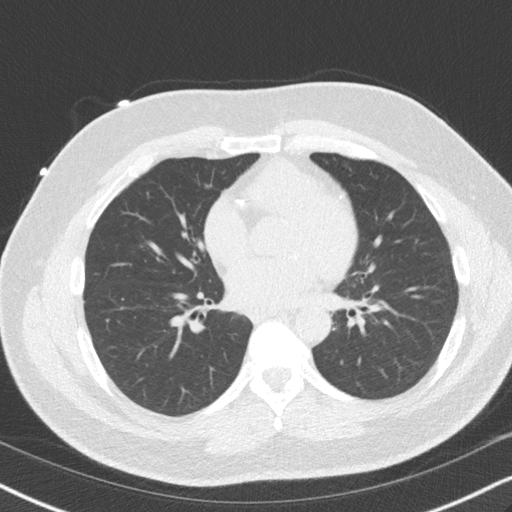
[im 36/46  lung]
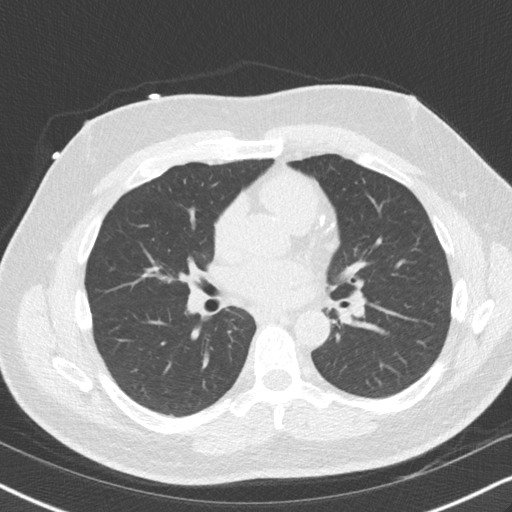
[im 41/46  lung]
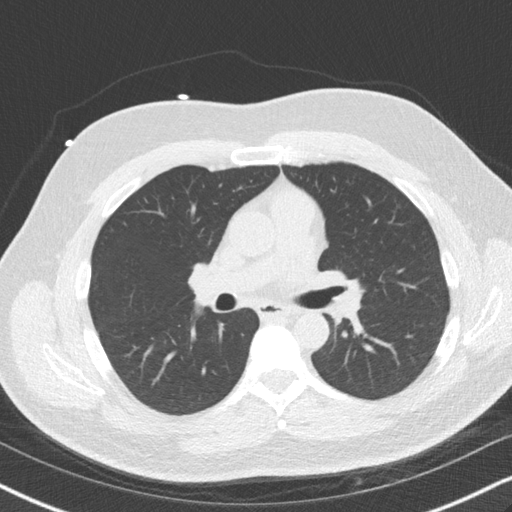

[16 of 20 positions shown; findings below may reference images not displayed]

FINDINGS: Limited view of the lung parenchyma demonstrates no suspicious
nodularity. Airways are normal.

Limited view of the mediastinum demonstrates no adenopathy.
Esophagus normal.

Limited view of the upper abdomen unremarkable.

Limited view of the skeleton demonstrates a 5 mm sclerotic lesion in
the LEFT transverse process of the T8 vertebral body which is
unchanged 11/17/2011 - image 104/6.
IMPRESSION: Benign sclerotic lesion in the thoracic spine. No significant
extracardiac findings
FINDINGS: Non-cardiac: See separate report from [REDACTED].

Ascending Aorta: Normal Caliber. Moderate calcifications of the
ascending and aortic root

Pericardium: Normal

Coronary arteries: Normal coronary origins. Heavy calcifications of
the LAD and RCA.
IMPRESSION: Coronary calcium score of 6816. This was 97th percentile for age and
sex matched control.

Recommend aggressive risk factor modification.

Consider noninvasive ischemic testing.

Shajhan Chaubey

*** End of Addendum ***
EXAM:
OVER-READ INTERPRETATION  CT CHEST

The following report is an over-read performed by radiologist Dr.
Dafne Magno [REDACTED] on 02/07/2019. This
over-read does not include interpretation of cardiac or coronary
anatomy or pathology. The coronary calcium interpretation by the
cardiologist is attached.
FINDINGS: Limited view of the lung parenchyma demonstrates no suspicious
nodularity. Airways are normal.

Limited view of the mediastinum demonstrates no adenopathy.
Esophagus normal.

Limited view of the upper abdomen unremarkable.

Limited view of the skeleton demonstrates a 5 mm sclerotic lesion in
the LEFT transverse process of the T8 vertebral body which is
unchanged 11/17/2011 - image 104/6.
IMPRESSION: Benign sclerotic lesion in the thoracic spine. No significant
extracardiac findings

## 2022-01-04 ENCOUNTER — Encounter: Payer: Self-pay | Admitting: Internal Medicine

## 2022-01-04 ENCOUNTER — Ambulatory Visit (INDEPENDENT_AMBULATORY_CARE_PROVIDER_SITE_OTHER): Payer: BC Managed Care – PPO | Admitting: Internal Medicine

## 2022-01-04 VITALS — BP 124/66 | HR 76 | Temp 97.7°F | Ht 68.0 in | Wt 209.0 lb

## 2022-01-04 DIAGNOSIS — C61 Malignant neoplasm of prostate: Secondary | ICD-10-CM | POA: Diagnosis not present

## 2022-01-04 DIAGNOSIS — E559 Vitamin D deficiency, unspecified: Secondary | ICD-10-CM | POA: Diagnosis not present

## 2022-01-04 DIAGNOSIS — E538 Deficiency of other specified B group vitamins: Secondary | ICD-10-CM

## 2022-01-04 DIAGNOSIS — E108 Type 1 diabetes mellitus with unspecified complications: Secondary | ICD-10-CM

## 2022-01-04 DIAGNOSIS — Z23 Encounter for immunization: Secondary | ICD-10-CM | POA: Diagnosis not present

## 2022-01-04 DIAGNOSIS — Z0001 Encounter for general adult medical examination with abnormal findings: Secondary | ICD-10-CM | POA: Diagnosis not present

## 2022-01-04 LAB — BASIC METABOLIC PANEL
BUN: 13 mg/dL (ref 6–23)
CO2: 27 mEq/L (ref 19–32)
Calcium: 9.1 mg/dL (ref 8.4–10.5)
Chloride: 104 mEq/L (ref 96–112)
Creatinine, Ser: 0.85 mg/dL (ref 0.40–1.50)
GFR: 92.03 mL/min (ref >60.00)
Glucose, Bld: 154 mg/dL — ABNORMAL HIGH (ref 70–99)
Potassium: 4 mEq/L (ref 3.5–5.1)
Sodium: 140 mEq/L (ref 135–145)

## 2022-01-04 LAB — LIPID PANEL
Cholesterol: 152 mg/dL (ref 0–200)
HDL: 68.5 mg/dL (ref >39.00)
LDL Cholesterol: 73 mg/dL (ref 0–99)
NonHDL: 83.97
Total CHOL/HDL Ratio: 2
Triglycerides: 56 mg/dL (ref 0.0–149.0)
VLDL: 11.2 mg/dL (ref 0.0–40.0)

## 2022-01-04 LAB — CBC WITH DIFFERENTIAL/PLATELET
Basophils Absolute: 0.1 10*3/uL (ref 0.0–0.1)
Basophils Relative: 0.9 % (ref 0.0–3.0)
Eosinophils Absolute: 0.2 10*3/uL (ref 0.0–0.7)
Eosinophils Relative: 2.6 % (ref 0.0–5.0)
HCT: 43.6 % (ref 39.0–52.0)
Hemoglobin: 14.8 g/dL (ref 13.0–17.0)
Lymphocytes Relative: 30 % (ref 12.0–46.0)
Lymphs Abs: 2.5 10*3/uL (ref 0.7–4.0)
MCHC: 34 g/dL (ref 30.0–36.0)
MCV: 93.4 fl (ref 78.0–100.0)
Monocytes Absolute: 0.6 10*3/uL (ref 0.1–1.0)
Monocytes Relative: 7.4 % (ref 3.0–12.0)
Neutro Abs: 4.9 10*3/uL (ref 1.4–7.7)
Neutrophils Relative %: 59.1 % (ref 43.0–77.0)
Platelets: 254 10*3/uL (ref 150.0–400.0)
RBC: 4.67 Mil/uL (ref 4.22–5.81)
RDW: 13.9 % (ref 11.5–15.5)
WBC: 8.3 10*3/uL (ref 4.0–10.5)

## 2022-01-04 LAB — URINALYSIS, ROUTINE W REFLEX MICROSCOPIC
Bilirubin Urine: NEGATIVE
Hgb urine dipstick: NEGATIVE
Ketones, ur: NEGATIVE
Leukocytes,Ua: NEGATIVE
Nitrite: NEGATIVE
Specific Gravity, Urine: 1.02 (ref 1.000–1.030)
Total Protein, Urine: NEGATIVE
Urine Glucose: NEGATIVE
Urobilinogen, UA: 0.2 (ref 0.0–1.0)
pH: 7 (ref 5.0–8.0)

## 2022-01-04 LAB — HEPATIC FUNCTION PANEL
ALT: 16 U/L (ref 0–53)
AST: 16 U/L (ref 0–37)
Albumin: 3.9 g/dL (ref 3.5–5.2)
Alkaline Phosphatase: 68 U/L (ref 39–117)
Bilirubin, Direct: 0.1 mg/dL (ref 0.0–0.3)
Total Bilirubin: 0.6 mg/dL (ref 0.2–1.2)
Total Protein: 6.5 g/dL (ref 6.0–8.3)

## 2022-01-04 LAB — VITAMIN B12: Vitamin B-12: 478 pg/mL (ref 211–911)

## 2022-01-04 LAB — MICROALBUMIN / CREATININE URINE RATIO
Creatinine,U: 105.4 mg/dL
Microalb Creat Ratio: 0.7 mg/g (ref 0.0–30.0)
Microalb, Ur: <0.7 mg/dL (ref 0.0–1.9)

## 2022-01-04 LAB — PSA: PSA: 0 ng/mL — ABNORMAL LOW (ref 0.10–4.00)

## 2022-01-04 LAB — HEMOGLOBIN A1C: Hgb A1c MFr Bld: 7.4 % — ABNORMAL HIGH (ref 4.6–6.5)

## 2022-01-04 LAB — VITAMIN D 25 HYDROXY (VIT D DEFICIENCY, FRACTURES): VITD: 34.6 ng/mL (ref 30.00–100.00)

## 2022-01-04 LAB — TSH: TSH: 1.17 u[IU]/mL (ref 0.35–5.50)

## 2022-01-04 NOTE — Assessment & Plan Note (Signed)

## 2022-01-04 NOTE — Assessment & Plan Note (Signed)
Last vitamin D Lab Results  Component Value Date   VD25OH 34.60 01/04/2022   Low, to start oral replacement

## 2022-01-04 NOTE — Patient Instructions (Signed)
You had the flu shot today  Please continue all other medications as before, and refills have been done if requested.  Please have the pharmacy call with any other refills you may need.  Please continue your efforts at being more active, low cholesterol diet, and weight control.  You are otherwise up to date with prevention measures today.  Please keep your appointments with your specialists as you may have planned  Please go to the LAB at the blood drawing area for the tests to be done  You will be contacted by phone if any changes need to be made immediately.  Otherwise, you will receive a letter about your results with an explanation, but please check with MyChart first.  Please remember to sign up for MyChart if you have not done so, as this will be important to you in the future with finding out test results, communicating by private email, and scheduling acute appointments online when needed.  Please make an Appointment to return for your 1 year visit, or sooner if needed

## 2022-01-04 NOTE — Progress Notes (Signed)
Patient ID: Donald Lopez, male   DOB: [birth year], 66 y.o.   MRN: [id number]         Chief Complaint:: wellness exam and low B12, prostate ca, DM, low vit d       HPI:  Donald Lopez is a 66 y.o. male here for wellness exam; declines covid booster, o/w up to date                        Also now with Libre3, follow with endo closely.  A1c 7.4 today.  Pt denies chest pain, increased sob or doe, wheezing, orthopnea, PND, increased LE swelling, palpitations, dizziness or syncope.   Pt denies polydipsia, polyuria, or new focal neuro s/s.    Pt denies fever, wt loss, night sweats, loss of appetite, or other constitutional symptoms   To see endo and cardiology soon.   Due for flu shot.  No longer seeing urology on regular basis. Wt Readings from Last 3 Encounters:  01/04/22 209 lb (94.8 kg)  10/02/21 208 lb 6.4 oz (94.5 kg)  02/27/21 207 lb (93.9 kg)   BP Readings from Last 3 Encounters:  01/04/22 124/66  10/02/21 122/62  02/27/21 (!) 150/70   Immunization History  Administered Date(s) Administered   Influenza Whole 11/10/2007, 10/10/2009   Influenza, Quadrivalent, Recombinant, Inj, Pf 10/14/2018   Influenza,inj,Quad PF,6+ Mos 10/09/2012, 11/19/2013, 02/25/2017, 12/10/2019, 11/24/2020, 01/04/2022   PFIZER(Purple Top)SARS-COV-2 Vaccination 03/24/2019, 04/14/2019, 09/25/2019   PNEUMOCOCCAL CONJUGATE-20 12/15/2020   Pneumococcal Polysaccharide-23 09/10/2011   Td 06/24/2008   Tdap 11/17/2018   Zoster Recombinat (Shingrix) 12/12/2019, 03/10/2020   There are no preventive care reminders to display for this patient.     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 s/p PRP OD  s/p PPV with laser and cryo OS 5/10   TOBACCO USE, QUIT 11/17/2006   Trigger finger    VARICOSE VEINS, LOWER EXTREMITIES 11/17/2006   Past Surgical History:  Procedure Laterality Date   CHOLECYSTECTOMY     KNEE ARTHROSCOPY     Left   PROSTATE SURGERY  10/19/2014   [facility]/ Dr. Georgetta Haber    SHOULDER SURGERY     Left   VASECTOMY     VITRECTOMY  01/19/2007   Right   VITRECTOMY      reports that he has never smoked. He has never used smokeless tobacco. He reports current alcohol use. He reports that he does not use drugs. family history includes Cancer in his father; Hypertension in his mother; Varicose Veins in his maternal aunt and maternal grandmother. Allergies  Allergen Reactions   Penicillins     REACTION: unspecified reaction   Current Outpatient Medications on File Prior to Visit  Medication Sig Dispense Refill   ACCU-CHEK GUIDE test strip CHECK BLOOD SUGAR FIVE TIMES DAILY 450 strip 0   Accu-Chek Softclix Lancets lancets Use to check blood sugar 5 times per day. Dx Code: E10.8 100 each 12   Alcohol Swabs (B-D SINGLE USE SWABS REGULAR) PADS USE AS  DIRECTED 100 each 0   amLODipine (NORVASC) 5 MG tablet TAKE ONE TABLET BY MOUTH ONCE DAILY. please schedule appontment, 90 tablet 2   aspirin EC 81 MG tablet Take 81 mg by mouth daily.     atorvastatin (LIPITOR) 40 MG tablet Take 1 tablet (40 mg total) by mouth daily. 90 tablet 0   Blood Glucose Calibration (ACCU-CHEK GUIDE CONTROL) LIQD USE AS DIRECTED WITH GLUCOSE METER 1 each 3   Blood Glucose Monitoring Suppl (ACCU-CHEK GUIDE) w/Device KIT USE AS DIRECTED 1 kit 0   Continuous Blood Gluc Sensor (FREESTYLE LIBRE 3 SENSOR) MISC 1 applicator by Does not apply route every 14 (fourteen) days. Place 1 sensor on the skin every 14 days. Use to check glucose continuously  2 each 5   Cyanocobalamin (B-12 COMPLIANCE INJECTION IJ) Inject as directed every 30 (thirty) days.     DROPLET INSULIN SYRINGE 30G X 1/2" 0.3 ML MISC USE AS DIRECTED FOUR TIMES A DAY 400 each 2   glucose blood (ACCU-CHEK AVIVA PLUS) test strip Check blood sugar 3 times a day 100 each 3   insulin aspart (NOVOLOG) 100 UNIT/ML injection 2-5 UNITS SUBCUTANEOUSLY 3 TIMES DAILY BEFORE MEALS. 30 mL 2   insulin degludec (TRESIBA FLEXTOUCH) 200 UNIT/ML FlexTouch Pen Inject 36 units daily 9 mL 2   Insulin Pen Needle (PEN NEEDLES) 33G X 4 MM MISC Use to inject tresiba daily 100 each 1   lisinopril (ZESTRIL) 20 MG tablet TAKE ONE TABLET ONCE DAILY 90 tablet 3   sildenafil (VIAGRA) 100 MG tablet Take 1 tablet (100 mg total) by mouth daily as needed for erectile dysfunction. 30 tablet 5   VITAMIN D, CHOLECALCIFEROL, PO Take 1 capsule by mouth daily.     insulin glargine (LANTUS) 100 UNIT/ML injection Inject 0.41 mLs (41 Units total) into the skin daily. (Patient not taking: Reported on 01/04/2022) 50 mL 3   No current facility-administered medications on file prior to visit.        ROS:  All others reviewed and negative.  Objective        PE:  BP 124/66 (BP Location: Left Arm, Patient Position: Sitting, Cuff Size: Large)   Pulse 76   Temp 97.7 F (36.5 C) (Oral)   Ht _0  (1.727 m)   Wt 209 lb (94.8 kg)   SpO2 95%   BMI 31.78 kg/m                 Constitutional: Pt appears in NAD               HENT: Head: NCAT.                Right Ear: External ear normal.                 Left Ear: External ear normal.                Eyes: . Pupils are equal, round, and reactive to light. Conjunctivae and EOM are normal               Nose: without d/c or deformity               Neck: Neck supple. Gross normal ROM               Cardiovascular: Normal rate and regular rhythm.                 Pulmonary/Chest: Effort normal and breath sounds without rales or  wheezing.                Abd:  Soft, NT, ND, + BS, no  organomegaly               Neurological: Pt is alert. At baseline orientation, motor grossly intact               Skin: Skin is warm. No rashes, no other new lesions, LE edema - none               Psychiatric: Pt behavior is normal without agitation   Micro: none  Cardiac tracings I have personally interpreted today:  none  Pertinent Radiological findings (summarize): none   Lab Results  Component Value Date   WBC 8.3 01/04/2022   HGB 14.8 01/04/2022   HCT 43.6 01/04/2022   PLT 254.0 01/04/2022   GLUCOSE 154 (H) 01/04/2022   CHOL 152 01/04/2022   TRIG 56.0 01/04/2022   HDL 68.50 01/04/2022   LDLDIRECT 123.2 01/22/2013   LDLCALC 73 01/04/2022   ALT 16 01/04/2022   AST 16 01/04/2022   NA 140 01/04/2022   K 4.0 01/04/2022   CL 104 01/04/2022   CREATININE 0.85 01/04/2022   BUN 13 01/04/2022   CO2 27 01/04/2022   TSH 1.17 01/04/2022   PSA 0.00 (L) 01/04/2022   HGBA1C 7.4 (H) 01/04/2022   MICROALBUR <0.7 01/04/2022   Assessment/Plan:  Donald Lopez is a 66 y.o. White or Caucasian [1] male with  has a past medical history of Cancer ([place]), Cancer of prostate with intermediate recurrence risk (stage T2b-c or Gleason 7 or PSA 10-20) ([place]) (07/30/2014), DIABETES MELLITUS, TYPE I (11/17/2006), DIABETIC  RETINOPATHY (11/17/2006), HPV (11/17/2006), HYPERLIPIDEMIA (11/17/2006), Ischemic optic neuropathy, right (01/21/2011), POLYP, GALLBLADDER (11/17/2006), Proliferative diabetic retinopathy(362.02) (08/26/2011), TOBACCO USE, QUIT (11/17/2006), Trigger finger, and VARICOSE VEINS, LOWER EXTREMITIES (11/17/2006).  Cancer of prostate with intermediate recurrence risk (stage T2b-c or Gleason 7 or PSA 10-20) ([facility]) Asympt, also for f/u psa  Encounter for well adult exam with abnormal findings Age and sex appropriate education and counseling updated with regular exercise and diet Referrals for preventative services - none needed Immunizations addressed - declines covid booster Smoking  counseling  - none needed Evidence for depression or other mood disorder - none significant Most recent labs reviewed. I have personally reviewed and have noted: 1) the patient's medical and social history 2) The patient's current medications and supplements 3) The patient's height, weight, and BMI have been recorded in the chart   B12 deficiency Lab Results  Component Value Date   [id number] 426 01/04/2022   Stable, cont oral replacement - b12 1000 mcg qd   Type 1 diabetes mellitus with complications ([place]) Lab Results  Component Value Date   HGBA1C 7.4 (H) 01/04/2022   Stable, pt to continue current medical treatment and f/u endo as planned   Vitamin D deficiency Last vitamin D Lab Results  Component Value Date   VD25OH 34.60 01/04/2022   Low, to start oral replacement  Followup: Return in about 1 year (around 01/05/2023).  Cathlean Cower, MD 01/04/2022 9:21 PM [facility]

## 2022-01-04 NOTE — Assessment & Plan Note (Signed)
Lab Results  Component Value Date   VITAMINB12 478 01/04/2022   Stable, cont oral replacement - b12 1000 mcg qd

## 2022-01-04 NOTE — Assessment & Plan Note (Addendum)
Lab Results  Component Value Date   HGBA1C 7.4 (H) 01/04/2022   Stable, pt to continue current medical treatment and f/u endo as planned

## 2022-01-04 NOTE — Assessment & Plan Note (Signed)
Asympt, also for f/u psa

## 2022-01-25 ENCOUNTER — Other Ambulatory Visit (INDEPENDENT_AMBULATORY_CARE_PROVIDER_SITE_OTHER): Payer: BC Managed Care – PPO

## 2022-01-25 DIAGNOSIS — E1065 Type 1 diabetes mellitus with hyperglycemia: Secondary | ICD-10-CM | POA: Diagnosis not present

## 2022-01-25 LAB — BASIC METABOLIC PANEL
BUN: 14 mg/dL (ref 6–23)
CO2: 30 mEq/L (ref 19–32)
Calcium: 9.4 mg/dL (ref 8.4–10.5)
Chloride: 104 mEq/L (ref 96–112)
Creatinine, Ser: 0.89 mg/dL (ref 0.40–1.50)
GFR: 90.73 mL/min (ref >60.00)
Glucose, Bld: 142 mg/dL — ABNORMAL HIGH (ref 70–99)
Potassium: 4.7 mEq/L (ref 3.5–5.1)
Sodium: 141 mEq/L (ref 135–145)

## 2022-01-25 LAB — HEMOGLOBIN A1C: Hgb A1c MFr Bld: 7.2 % — ABNORMAL HIGH (ref 4.6–6.5)

## 2022-01-27 ENCOUNTER — Encounter: Payer: Self-pay | Admitting: Endocrinology

## 2022-01-27 ENCOUNTER — Ambulatory Visit: Payer: BC Managed Care – PPO | Admitting: Endocrinology

## 2022-01-27 VITALS — BP 122/52 | HR 79 | Ht 67.0 in | Wt 213.0 lb

## 2022-01-27 DIAGNOSIS — E1065 Type 1 diabetes mellitus with hyperglycemia: Secondary | ICD-10-CM

## 2022-01-27 NOTE — Progress Notes (Signed)
Patient ID: Donald Lopez, male   DOB: Feb 12, 1955, 67 y.o.   MRN: [id number]    Chief complaint : Follow up of Type 1 Diabetes  History of Present Illness:          Date of diagnosis:    1985  Prior history: He has been on multiple injections since diagnosis A1c in the last few years has ranged between 7.9 and 8.7  INSULIN regimen : 40 NovoLog 3 to 5 units mostly at meals  Recent history:   His A1c is significantly better at 7.2 compared to 8.5 on his initial visit and previously best was 7.9    He has been on [place] instead of Lantus since his last visit Only recently as going up 2 units on the starting dose of 38 units However he has difficulty controlling his postprandial readings especially late at night Although his control is better is glucose readings are improving only in the last 3 days or so Most of his difficulty with hyperglycemia is with inconsistent coverage for variability in his lunch, dinner and evening snacks  Fasting glucose: This is still mostly running high with carryover from the night before but the last couple of days may be improving  Postprandial readings after BREAKFAST may be low because of eating only a small meal with 20 g of carbohydrate and using 3 units of insulin along with either going for a walk or going to the gym afterwards  Blood sugars after lunch are somewhat variable although on average not significantly higher Most of his hyperglycemia is late at night after 8 PM even though his blood sugars after dinner are not consistently high and occasionally low also.  This is likely from late evening snacks which may be high fat and not covering these  As before does not like to count carbohydrates Also not interested in insulin pump per     Interpretation of his freestyle libre version 3 data as follows  OVERNIGHT blood sugars are significantly higher overall until about 5 AM but somewhat variable and relatively better in the last 2 or 3  days FASTING blood sugars are in the 150 generally but variable POSTPRANDIAL readings as discussed above are trending lower after breakfast highly They are somewhat variable at lunchtime with occasional hyperglycemia but on average not rising excessively After dinner blood sugars are inconsistent with average 157 but usually tend to rise more slowly late at night with highest readings around 2-4 AM with average of 206 Hypoglycemia has documented around 6-7 PM right around suppertime and rarely after breakfast and once overnight  CGM use % of time 96  2-week average/GV 169/28  Time in range     61   %  % Time Above 180 32  % Time above 250 6  % Time Below 70 1      Symptoms of hypoglycemia: Blurred vision, sweating Treatment of hypoglycemia: Juices                Exercise: Elliptical or other exercise either morning or evening Does not always check blood sugar before exercising, blood sugar may come down somewhat after exercise .          Most recent dietitian/nurse educator visit :        Several years ago   Diabetes labs:  Lab Results  Component Value Date   HGBA1C 7.2 (H) 01/25/2022   HGBA1C 7.4 (H) 01/04/2022   HGBA1C 8.5 (H) 09/24/2021   Lab Results  Component Value Date   MICROALBUR <0.7 01/04/2022   LDLCALC 73 01/04/2022   CREATININE 0.89 01/25/2022    Lab on 01/25/2022  Component Date Value Ref Range Status   Sodium 01/25/2022 141  135 - 145 mEq/L Final   Potassium 01/25/2022 4.7  3.5 - 5.1 mEq/L Final   Chloride 01/25/2022 104  96 - 112 mEq/L Final   CO2 01/25/2022 30  19 - 32 mEq/L Final   Glucose, Bld 01/25/2022 142 (H)  70 - 99 mg/dL Final   BUN 01/25/2022 14  6 - 23 mg/dL Final   Creatinine, Ser 01/25/2022 0.89  0.40 - 1.50 mg/dL Final   GFR 01/25/2022 90.73  >60.00 mL/min Final   Calculated using the CKD-EPI Creatinine Equation (2021)   Calcium 01/25/2022 9.4  8.4 - 10.5 mg/dL Final   Hgb A1c MFr Bld 01/25/2022 7.2 (H)  4.6 - 6.5 % Final   Glycemic  Control Guidelines for People with Diabetes:Non Diabetic:  <6%Goal of Therapy: <7%Additional Action Suggested:  >8%     Allergies as of 01/27/2022       Reactions   Penicillins    REACTION: unspecified reaction        Medication List        Accurate as of January 27, 2022 10:54 AM. If you have any questions, ask your nurse or doctor.          Accu-Chek Guide Control Liqd USE AS DIRECTED WITH GLUCOSE METER   Accu-Chek Guide test strip Generic drug: glucose blood CHECK BLOOD SUGAR FIVE TIMES DAILY   Accu-Chek Aviva Plus test strip Generic drug: glucose blood Check blood sugar 3 times a day   Accu-Chek Guide w/Device Kit USE AS DIRECTED   Accu-Chek Softclix Lancets lancets Use to check blood sugar 5 times per day. Dx Code: E10.8   amLODipine 5 MG tablet Commonly known as: NORVASC TAKE ONE TABLET BY MOUTH ONCE DAILY. please schedule appontment,   aspirin EC 81 MG tablet Take 81 mg by mouth daily.   atorvastatin 40 MG tablet Commonly known as: LIPITOR Take 1 tablet (40 mg total) by mouth daily.   B-12 COMPLIANCE INJECTION IJ Inject as directed every 30 (thirty) days.   B-D SINGLE USE SWABS REGULAR Pads USE AS DIRECTED   Droplet Insulin Syringe 30G X 1/2" 0.3 ML Misc Generic drug: Insulin Syringe-Needle U-100 USE AS DIRECTED FOUR TIMES A DAY   FreeStyle Libre 3 Sensor Misc 1 applicator by Does not apply route every 14 (fourteen) days. Place 1 sensor on the skin every 14 days. Use to check glucose continuously   insulin aspart 100 UNIT/ML injection Commonly known as: NovoLOG 2-5 UNITS SUBCUTANEOUSLY 3 TIMES DAILY BEFORE MEALS.   insulin glargine 100 UNIT/ML injection Commonly known as: Lantus Inject 0.41 mLs (41 Units total) into the skin daily.   lisinopril 20 MG tablet Commonly known as: ZESTRIL TAKE ONE TABLET ONCE DAILY   Pen Needles 33G X 4 MM Misc Use to inject tresiba daily   sildenafil 100 MG tablet Commonly known as: VIAGRA Take 1  tablet (100 mg total) by mouth daily as needed for erectile dysfunction.   Tyler Aas FlexTouch 200 UNIT/ML FlexTouch Pen Generic drug: insulin degludec Inject 36 units daily What changed: how much to take   VITAMIN D (CHOLECALCIFEROL) PO Take 1 capsule by mouth daily.        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 s/p PRP OD  s/p PPV with laser and cryo OS 5/10   TOBACCO USE, QUIT 11/17/2006   Trigger finger    VARICOSE VEINS, LOWER EXTREMITIES 11/17/2006    Past Surgical History:  Procedure Laterality Date   CHOLECYSTECTOMY     KNEE ARTHROSCOPY     Left   PROSTATE SURGERY  10/19/2014   [facility]/ Dr. Georgetta Haber    SHOULDER SURGERY     Left   VASECTOMY     VITRECTOMY  01/19/2007   Right   VITRECTOMY      Family History  Problem Relation Age of Onset   Cancer Father    Hypertension Mother    Varicose Veins Maternal Aunt    Varicose Veins Maternal Grandmother     Social History:  reports that he has never smoked. He has never used smokeless tobacco. He reports current alcohol use. He reports that he does not use drugs.    Review of Systems:   Blood pressure: Controlled with lisinopril 20 mg prescribed by his cardiologist Last urine microalbumin normal in 9/23  Diabetic retinopathy: Followed regularly for proliferative retinopathy and history of vitreous hemorrhage Last visit 2/23  Last foot exam  11/22 by PCP  Lipids: Last LDL 94, treated with 40 mg atorvastatin by cardiologist Calcium scoring result was in the 97th percentile  Diabetes complications: Retinopathy, no symptoms of neuropathy   Physical Examination:  BP (!) 122/52   Pulse 79   Ht '5\' 7"'$  (1.702 m)   Wt 213 lb (96.6 kg)   SpO2 96%   BMI 33.36 kg/m         ASSESSMENT/PLAN   Diabetes type 1 on multiple insulin injections  Current insulin regimen: Tresiba 38 units and mealtime insulin generally 3 to 5 units  A1c is significantly better at 7.2  Problems identified: Variable control of postprandial readings especially at dinnertime likely from not accurately estimating type of meal and carbohydrate intake with mealtime calculation Also likely not covering late evening snacks which are causing higher sugars Overall fasting blood sugars are still high although he is just starting to increase his [place] again to 38 Tendency to low normal or low sugars after breakfast from relatively low carbohydrate intake and being more active   RECOMMENDATIONS:  No change in [place] but likely needs to go up to 40 units if fasting readings are still high Reduce breakfast coverage to 2 units and may skip if planning to be more active especially with blood sugar near normal before eating To adjust dinnertime coverage better based on type of meal and carbohydrates as well as reduce the dose of plan to exercise For high blood sugars use correction factor 1 unit per 40 over target and this was discussed He again may benefit from reviewing his management in more detail with diabetes educator Currently still reluctant to do insulin pump which would be ideal for him   There are no Patient Instructions on file for this visit.  Total visit time including counseling = 35 minutes  Elayne Snare 01/27/2022, 10:54 AM

## 2022-02-04 ENCOUNTER — Encounter: Payer: Self-pay | Admitting: Endocrinology

## 2022-02-08 ENCOUNTER — Ambulatory Visit: Payer: BC Managed Care – PPO | Attending: Cardiology | Admitting: Cardiology

## 2022-02-08 DIAGNOSIS — H25813 Combined forms of age-related cataract, bilateral: Secondary | ICD-10-CM | POA: Diagnosis not present

## 2022-02-08 DIAGNOSIS — Z8669 Personal history of other diseases of the nervous system and sense organs: Secondary | ICD-10-CM | POA: Diagnosis not present

## 2022-02-08 DIAGNOSIS — E1036 Type 1 diabetes mellitus with diabetic cataract: Secondary | ICD-10-CM | POA: Diagnosis not present

## 2022-02-08 DIAGNOSIS — E103593 Type 1 diabetes mellitus with proliferative diabetic retinopathy without macular edema, bilateral: Secondary | ICD-10-CM | POA: Diagnosis not present

## 2022-02-08 NOTE — Progress Notes (Deleted)
Cardiology Office Note:    Date:  05/30/2020   ID:  Donald Lopez, DOB 04/06/1955, MRN [id number]  PCP:  Carnie, James W, MD  Cardiologist:  None  Electrophysiologist:  None   Referring MD: Xzavion, James W, MD   Chief Complaint  Patient presents with  . Follow-up    6 months.  . Coronary Artery Disease    History of Present Illness:    Donald Lopez is a 67 y.o. male with a hx of type 1 diabetes, prostate cancer, hyperlipidemia, tobacco use who presents for follow-up.  He was referred by Dr. Lin for an evaluation of elevated calcium score, initially seen on 03/05/2019.  Patient underwent a calcium score on 02/07/2019, which was 1397 (97th percentile for age/gender).  Given the significant elevation in calcium score, he underwent a Lexiscan Myoview on 02/28/2019, which showed no evidence of ischemia, EF 61%.  At initial clinic visit, he denied any chest pain or dyspnea.  Reports that he exercises 3-4 times per week, walk 2 to 3 miles carrying 2 pound weights and also does the bicycle or elliptical at the [employer] for 30 minutes.  Denies any exertional symptoms.  Started statin in October 2020.  Never smoked.  Father had MI in 60s.  Mother had CVA in 70s.  Since last clinic visit, he reports that he has been doing well.  Denies any chest pain, dyspnea, syncope, or palpitations.  Reports occasional lightheadedness that corresponds to hypoglycemia.  Reports chronic left ankle swelling from varicose veins.  He exercises by walking his dog about 5 miles per day and also does cardio/weights at [employer] 2-3 times per week for 30 to 45 minutes.  He denies any exertional symptoms.   Past Medical History:  Diagnosis Date  . Cancer (HCC)    prostate cancer  . Cancer of prostate with intermediate recurrence risk (stage T2b-c or Gleason 7 or PSA 10-20) ([facility]) 07/30/2014  . DIABETES MELLITUS, TYPE I 11/17/2006  . DIABETIC  RETINOPATHY 11/17/2006  . 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 HYPERLIPIDEMIA 11/17/2006  .  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 POLYP, GALLBLADDER 11/17/2006  . 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 s/p PRP OD  s/p PPV with laser and cryo OS 5/10  . TOBACCO USE, QUIT 11/17/2006  . Trigger finger   . VARICOSE VEINS, LOWER EXTREMITIES 11/17/2006    Past Surgical History:  Procedure Laterality Date  . KNEE ARTHROSCOPY     Left  . PROSTATE SURGERY  October 2016   [facility]/ Dr. Himel   . SHOULDER SURGERY     Left  . VASECTOMY    . VITRECTOMY  2009   Right    Current Medications: Current Meds  Medication Sig  . ACCU-CHEK AVIVA PLUS test strip CHECK BLOOD SUGAR FIVE TIMES DAILY  . Accu-Chek Softclix Lancets lancets Use to check blood sugar 5 times per day. Dx Code: E10.8  . Alcohol Swabs (B-D SINGLE USE SWABS REGULAR) PADS USE AS DIRECTED  . amLODipine (NORVASC) 5 MG tablet TAKE 1 TABLET EACH DAY.  . aspirin EC 81 MG tablet Take 81 mg by mouth daily.  . atorvastatin (LIPITOR) 40 MG tablet Take 1 tablet (40 mg total) by mouth daily.  . Blood Glucose Monitoring Suppl (ACCU-CHEK AVIVA PLUS) w/Device KIT USE AS DIRECTED  . Cyanocobalamin (B-12 COMPLIANCE INJECTION IJ) Inject as directed every 30 (thirty) days.  . DROPLET INSULIN SYRINGE 30G X 1/2"   0.3 ML MISC USE AS DIRECTED FOUR TIMES A DAY  . insulin glargine (LANTUS) 100 UNIT/ML injection Inject 0.38 mLs (38 Units total) into the skin daily.  . lisinopril (ZESTRIL) 20 MG tablet TAKE 1 TABLET ONCE DAILY.  . NOVOLOG 100 UNIT/ML injection INJECT 2 TO 5 UNITS INTO THE SKIN 3 TIMES DAILY WITH MEALS (DISCARD VIAL 28 DAYS AFTER OPENING)  . sildenafil (VIAGRA) 100 MG tablet Take 1 tablet (100 mg total) by mouth daily as needed for erectile dysfunction.  . VITAMIN D, CHOLECALCIFEROL, PO Take 1 capsule by mouth daily.  . [DISCONTINUED] Vitamin D, Ergocalciferol, (DRISDOL) 1.25 MG (50000 UT) CAPS capsule Take 1 capsule (50,000  Units total) by mouth every 7 (seven) days.     Allergies:   Penicillins   Social History   Socioeconomic History  . Marital status: Married    Spouse name: Tresea Prendergrast  . Number of children: 3  . Years of education: Not on file  . Highest education level: Not on file  Occupational History  . Occupation: sales engennier  Tobacco Use  . Smoking status: Never Smoker  . Smokeless tobacco: Never Used  Substance and Sexual Activity  . Alcohol use: Yes    Comment: 5-6/week  . Drug use: No  . Sexual activity: Not on file  Other Topics Concern  . Not on file  Social History Narrative  . Not on file   Social Determinants of Health   Financial Resource Strain: Not on file  Food Insecurity: Not on file  Transportation Needs: Not on file  Physical Activity: Not on file  Stress: Not on file  Social Connections: Not on file     Family History: The patient's family history includes Cancer in his father; Hypertension in his mother; Varicose Veins in his maternal aunt and maternal grandmother.  ROS:   Please see the history of present illness.     All other systems reviewed and are negative.  EKGs/Labs/Other Studies Reviewed:    The following studies were reviewed today:   EKG:  EKG is ordered today.  The ekg ordered today demonstrates normal sinus rhythm, rate 75, no ST/T abnormality  Recent Labs: 11/23/2019: BUN 13; Creatinine, Ser 0.72; Potassium 4.1; Sodium 142 12/10/2019: ALT 18; Hemoglobin 15.3; Platelets 275.0; TSH 1.00  Recent Lipid Panel    Component Value Date/Time   CHOL 147 12/10/2019 1033   CHOL 152 11/09/2019 1039   TRIG 72.0 12/10/2019 1033   HDL 69.60 12/10/2019 1033   HDL 60 11/09/2019 1039   CHOLHDL 2 12/10/2019 1033   VLDL 14.4 12/10/2019 1033   LDLCALC 63 12/10/2019 1033   LDLCALC 80 11/09/2019 1039   LDLDIRECT 123.2 01/22/2013 1101    Calcium score 02/07/19: Coronary calcium score of 1397. This was 97th percentile for age and sex  matched control. Recommend aggressive risk factor modification. Consider noninvasive ischemic testing.  Lexiscan Myoview 02/28/2019:  The left ventricular ejection fraction is normal (55-65%).  Nuclear stress EF: 61%.  There was no ST segment deviation noted during stress.  The study is normal. No evidence of ischemia.  This is a low risk study.  No prior study for comparison.  Physical Exam:    VS:  BP 130/62 (BP Location: Left Arm, Patient Position: Sitting, Cuff Size: Normal)   Pulse 75   Ht 5' 8" (1.727 m)   Wt 206 lb (93.4 kg)   BMI 31.32 kg/m     Wt Readings from Last 3 Encounters:  05/30/20 206 lb (  93.4 kg)  04/07/20 207 lb 6.4 oz (94.1 kg)  12/10/19 209 lb 9.6 oz (95.1 kg)     GEN:  Well nourished, well developed in no acute distress HEENT: Normal NECK: No JVD CARDIAC: RRR, no murmurs, rubs, gallops RESPIRATORY:  Clear to auscultation without rales, wheezing or rhonchi  ABDOMEN: Soft, non-tender, non-distended MUSCULOSKELETAL:  No edema; No deformity  SKIN: Warm and dry NEUROLOGIC:  Alert and oriented x 3 PSYCHIATRIC:  Normal affect   ASSESSMENT:    1. Coronary artery disease involving native coronary artery of native heart without angina pectoris   2. Essential hypertension   3. Hyperlipidemia, unspecified hyperlipidemia type    PLAN:    Coronary artery disease: calcium score on 02/07/2019 was 1397 (97th percentile for age/gender).  Given the significant elevation in calcium score, he underwent a Lexiscan Myoview on 02/28/2019, which showed no evidence of ischemia, EF 61%.  He denies any anginal symptoms -Continue atorvastatin 40 mg daily, LDL 63 on 12/10/2019 -Continue aspirin 81 mg daily  Hypertension: On lisinopril 20 mg daily and amlodipine 5 mg daily.  Appears controlled  Hyperlipidemia: LDL 63 on 12/10/2019, continue atorvastatin 40 mg daily  Type 1 diabetes: On insulin, follows with endocrinology.  A1c 8.4% on 04/07/2020  RTC in 1  year   Medication Adjustments/Labs and Tests Ordered: Current medicines are reviewed at length with the patient today.  Concerns regarding medicines are outlined above.  Orders Placed This Encounter  Procedures  . EKG 12-Lead   No orders of the defined types were placed in this encounter.   Patient Instructions  Medication Instructions:  Your physician recommends that you continue on your current medications as directed. Please refer to the Current Medication list given to you today.  *If you need a refill on your cardiac medications before your next appointment, please call your pharmacy*  Follow-Up: At [facility], you and your health needs are our priority.  As part of our continuing mission to provide you with exceptional heart care, we have created designated Provider Care Teams.  These Care Teams include your primary Cardiologist (physician) and Advanced Practice Providers (APPs -  Physician Assistants and Nurse Practitioners) who all work together to provide you with the care you need, when you need it.  We recommend signing up for the patient portal called "MyChart".  Sign up information is provided on this After Visit Summary.  MyChart is used to connect with patients for Virtual Visits (Telemedicine).  Patients are able to view lab/test results, encounter notes, upcoming appointments, etc.  Non-urgent messages can be sent to your provider as well.   To learn more about what you can do with MyChart, go to [email or web address].    Your next appointment:   12 month(s)  The format for your next appointment:   In Person  Provider:   Kiyomi Pallo, MD      Signed, Charlee Whitebread L Magnolia Mattila, MD  05/30/2020 8:49 AM    [facility] 

## 2022-02-14 ENCOUNTER — Other Ambulatory Visit: Payer: Self-pay | Admitting: Cardiology

## 2022-02-15 ENCOUNTER — Telehealth: Payer: Self-pay | Admitting: Cardiology

## 2022-02-15 ENCOUNTER — Other Ambulatory Visit: Payer: Self-pay | Admitting: Cardiology

## 2022-02-15 NOTE — Telephone Encounter (Signed)
Pt is requesting a call back to discuss a NO SHOW appt from 01/22 with Dr. Gardiner Rhyme. He states he was double booked that day and this appt slipped his mind and he would like to not be charged for this.

## 2022-03-15 ENCOUNTER — Other Ambulatory Visit: Payer: Self-pay | Admitting: Endocrinology

## 2022-03-15 DIAGNOSIS — E1065 Type 1 diabetes mellitus with hyperglycemia: Secondary | ICD-10-CM

## 2022-04-01 ENCOUNTER — Other Ambulatory Visit: Payer: Self-pay | Admitting: Endocrinology

## 2022-04-01 DIAGNOSIS — E108 Type 1 diabetes mellitus with unspecified complications: Secondary | ICD-10-CM

## 2022-04-12 ENCOUNTER — Other Ambulatory Visit: Payer: Self-pay | Admitting: Cardiology

## 2022-04-13 NOTE — Progress Notes (Unsigned)
Cardiology Office Note:    Date:  05/30/2020   ID:  Donald Lopez, DOB 1955/09/03, MRN [id number]  PCP:  Biagio Borg, MD  Cardiologist:  None  Electrophysiologist:  None   Referring MD: Biagio Borg, MD   Chief Complaint  Patient presents with   Follow-up    6 months.   Coronary Artery Disease    History of Present Illness:    Donald Lopez is a 67 y.o. male with a hx of type 1 diabetes, prostate cancer, hyperlipidemia, tobacco use who presents for follow-up.  He was referred by Dr. Jenny Reichmann for an evaluation of elevated calcium score, initially seen on 03/05/2019.  Patient underwent a calcium score on 02/07/2019, which was 1397 (97th percentile for age/gender).  Given the significant elevation in calcium score, he underwent a Lexiscan Myoview on 02/28/2019, which showed no evidence of ischemia, EF 61%.  At initial clinic visit, he denied any chest pain or dyspnea.  Reports that he exercises 3-4 times per week, walk 2 to 3 miles carrying 2 pound weights and also does the bicycle or elliptical at the [facility] for 30 minutes.  Denies any exertional symptoms.  Started statin in October 2020.  Never smoked.  Father had MI in 70s.  Mother had CVA in 21s.  Since last clinic visit, he reports that he has been doing well.  Denies any chest pain, dyspnea, syncope, or palpitations.  Reports occasional lightheadedness that corresponds to hypoglycemia.  Reports chronic left ankle swelling from varicose veins.  He exercises by walking his dog about 5 miles per day and also does cardio/weights at [facility] 2-3 times per week for 30 to 45 minutes.  He denies any exertional symptoms.   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 s/p PRP OD  s/p PPV with laser and cryo OS 5/10   TOBACCO USE, QUIT 11/17/2006   Trigger finger    VARICOSE VEINS, LOWER EXTREMITIES 11/17/2006    Past Surgical History:  Procedure Laterality Date   KNEE ARTHROSCOPY     Left   PROSTATE SURGERY  October 2016   [facility] [place]/ Dr. Stanwood     Left   VASECTOMY     VITRECTOMY  2009   Right    Current Medications: Current Meds  Medication Sig   ACCU-CHEK AVIVA PLUS test strip CHECK BLOOD SUGAR FIVE TIMES DAILY   Accu-Chek Softclix Lancets lancets Use to check blood sugar 5 times per day. Dx Code: E10.8   Alcohol Swabs (B-D SINGLE USE SWABS REGULAR) PADS USE AS DIRECTED   amLODipine (NORVASC) 5 MG tablet TAKE 1 TABLET EACH DAY.   aspirin EC 81 MG tablet Take 81 mg by mouth daily.   atorvastatin (LIPITOR) 40 MG tablet Take 1 tablet (40 mg total) by mouth daily.   Blood Glucose Monitoring Suppl (ACCU-CHEK AVIVA PLUS) w/Device KIT USE AS DIRECTED   Cyanocobalamin (B-12 COMPLIANCE INJECTION IJ) Inject as directed every 30 (thirty) days.   DROPLET INSULIN SYRINGE 30G X 1/2"  0.3 ML MISC USE AS DIRECTED FOUR TIMES A DAY   insulin glargine (LANTUS) 100 UNIT/ML injection Inject 0.38 mLs (38 Units total) into the skin daily.   lisinopril (ZESTRIL) 20 MG tablet TAKE 1 TABLET ONCE DAILY.   NOVOLOG 100 UNIT/ML injection INJECT 2 TO 5 UNITS INTO THE SKIN 3 TIMES DAILY WITH MEALS (DISCARD VIAL 28 DAYS AFTER OPENING)   sildenafil (VIAGRA) 100 MG tablet Take 1 tablet (100 mg total) by mouth daily as needed for erectile dysfunction.   VITAMIN D, CHOLECALCIFEROL, PO Take 1 capsule by mouth daily.   [DISCONTINUED] Vitamin D, Ergocalciferol, (DRISDOL) 1.25 MG (50000 UT) CAPS capsule Take 1 capsule (50,000 Units total) by mouth every 7 (seven) days.      Allergies:   Penicillins   Social History   Socioeconomic History   Marital status: Married    Spouse name: Presenter, broadcasting   Number of children: 3   Years of education: Not on file   Highest education level: Not on file  Occupational History   Occupation: Airline pilot  Tobacco Use   Smoking status: Never Smoker   Smokeless tobacco: Never Used  Substance and Sexual Activity   Alcohol use: Yes    Comment: 5-6/week   Drug use: No   Sexual activity: Not on file  Other Topics Concern   Not on file  Social History Narrative   Not on file   Social Determinants of Health   Financial Resource Strain: Not on file  Food Insecurity: Not on file  Transportation Needs: Not on file  Physical Activity: Not on file  Stress: Not on file  Social Connections: Not on file     Family History: The patient's family history includes Cancer in his father; Hypertension in his mother; Varicose Veins in his maternal aunt and maternal grandmother.  ROS:   Please see the history of present illness.     All other systems reviewed and are negative.  EKGs/Labs/Other Studies Reviewed:    The following studies were reviewed today:   EKG:  EKG is ordered today.  The ekg ordered today demonstrates normal sinus rhythm, rate 75, no ST/T abnormality  Recent Labs: 11/23/2019: BUN 13; Creatinine, Ser 0.72; Potassium 4.1; Sodium 142 12/10/2019: ALT 18; Hemoglobin 15.3; Platelets 275.0; TSH 1.00  Recent Lipid Panel    Component Value Date/Time   CHOL 147 12/10/2019 1033   CHOL 152 11/09/2019 1039   TRIG 72.0 12/10/2019 1033   HDL 69.60 12/10/2019 1033   HDL 60 11/09/2019 1039   CHOLHDL 2 12/10/2019 1033   VLDL 14.4 12/10/2019 1033   LDLCALC 63 12/10/2019 1033   LDLCALC 80 11/09/2019 1039   LDLDIRECT 123.2 01/22/2013 1101    Calcium score 02/07/19: Coronary calcium score of 1397. This was 97th percentile for age and sex matched control. Recommend aggressive risk factor  modification. Consider noninvasive ischemic testing.  Lexiscan Myoview 02/28/2019: The left ventricular ejection fraction is normal (55-65%). Nuclear stress EF: 61%. There was no ST segment deviation noted during stress. The study is normal. No evidence of ischemia. This is a low risk study. No prior study for comparison.  Physical Exam:    VS:  BP 130/62 (BP Location: Left Arm, Patient Position: Sitting, Cuff Size: Normal)   Pulse 75   Ht 5\' 8"  (1.727 m)   Wt 206 lb (93.4 kg)   BMI 31.32 kg/m     Wt Readings from Last 3 Encounters:  05/30/20 206 lb (93.4 kg)  04/07/20 207 lb  6.4 oz (94.1 kg)  12/10/19 209 lb 9.6 oz (95.1 kg)     GEN:  Well nourished, well developed in no acute distress HEENT: Normal NECK: No JVD CARDIAC: RRR, no murmurs, rubs, gallops RESPIRATORY:  Clear to auscultation without rales, wheezing or rhonchi  ABDOMEN: Soft, non-tender, non-distended MUSCULOSKELETAL:  No edema; No deformity  SKIN: Warm and dry NEUROLOGIC:  Alert and oriented x 3 PSYCHIATRIC:  Normal affect   ASSESSMENT:    1. Coronary artery disease involving native coronary artery of native heart without angina pectoris   2. Essential hypertension   3. Hyperlipidemia, unspecified hyperlipidemia type    PLAN:    Coronary artery disease: calcium score on 02/07/2019 was 1397 (97th percentile for age/gender).  Given the significant elevation in calcium score, he underwent a Lexiscan Myoview on 02/28/2019, which showed no evidence of ischemia, EF 61%.  He denies any anginal symptoms -Continue atorvastatin 40 mg daily*** -Continue aspirin 81 mg daily  Hypertension: On lisinopril 20 mg daily and amlodipine 5 mg daily.  Appears controlled  Hyperlipidemia: LDL 73 on 01/04/2022, continue atorvastatin 40 mg daily***  Type 1 diabetes: On insulin, follows with endocrinology.  A1c 7.4% on 01/01/2022  RTC in 1 year   Medication Adjustments/Labs and Tests Ordered: Current medicines are reviewed  at length with the patient today.  Concerns regarding medicines are outlined above.  Orders Placed This Encounter  Procedures   EKG 12-Lead   No orders of the defined types were placed in this encounter.   Patient Instructions  Medication Instructions:  Your physician recommends that you continue on your current medications as directed. Please refer to the Current Medication list given to you today.  *If you need a refill on your cardiac medications before your next appointment, please call your pharmacy*  Follow-Up: At [facility], you and your health needs are our priority.  As part of our continuing mission to provide you with exceptional heart care, we have created designated Provider Care Teams.  These Care Teams include your primary Cardiologist (physician) and Advanced Practice Providers (APPs -  Physician Assistants and Nurse Practitioners) who all work together to provide you with the care you need, when you need it.  We recommend signing up for the patient portal called "MyChart".  Sign up information is provided on this After Visit Summary.  MyChart is used to connect with patients for Virtual Visits (Telemedicine).  Patients are able to view lab/test results, encounter notes, upcoming appointments, etc.  Non-urgent messages can be sent to your provider as well.   To learn more about what you can do with MyChart, go to [email or web address].    Your next appointment:   12 month(s)  The format for your next appointment:   In Person  Provider:   Oswaldo Milian, MD      Signed, Donato Heinz, MD  05/30/2020 8:49 AM    [place]

## 2022-04-15 ENCOUNTER — Encounter (HOSPITAL_BASED_OUTPATIENT_CLINIC_OR_DEPARTMENT_OTHER): Payer: Self-pay | Admitting: Cardiology

## 2022-04-15 ENCOUNTER — Ambulatory Visit (HOSPITAL_BASED_OUTPATIENT_CLINIC_OR_DEPARTMENT_OTHER): Payer: BC Managed Care – PPO | Admitting: Cardiology

## 2022-04-15 VITALS — BP 122/62 | HR 74 | Ht 68.0 in | Wt 212.0 lb

## 2022-04-15 DIAGNOSIS — E785 Hyperlipidemia, unspecified: Secondary | ICD-10-CM

## 2022-04-15 DIAGNOSIS — I10 Essential (primary) hypertension: Secondary | ICD-10-CM

## 2022-04-15 DIAGNOSIS — I2510 Atherosclerotic heart disease of native coronary artery without angina pectoris: Secondary | ICD-10-CM | POA: Diagnosis not present

## 2022-04-15 DIAGNOSIS — Z8279 Family history of other congenital malformations, deformations and chromosomal abnormalities: Secondary | ICD-10-CM

## 2022-04-15 MED ORDER — ATORVASTATIN CALCIUM 80 MG PO TABS: 80.0000 mg | ORAL_TABLET | Freq: Every day | ORAL | 3 refills | Status: DC

## 2022-04-15 NOTE — Patient Instructions (Signed)
Medication Instructions:  INCREASE atorvastatin (Lipitor) 80 mg daily  *If you need a refill on your cardiac medications before your next appointment, please call your pharmacy*  Lab Work: Please return for FASTING labs in 2 months (Lipid)  Our in office lab hours are Monday-Friday 8:00-4:00, closed for lunch 1-2 pm.  No appointment needed.  [facility] locations:   [place] ([place]) - [id number] [place] [address] - [address] [place] - [address] Oncologist)  Testing/Procedures: Your physician has requested that you have an echocardiogram. Echocardiography is a painless test that uses sound waves to create images of your heart. It provides your doctor with information about the size and shape of your heart and how well your heart's chambers and valves are working. This procedure takes approximately one hour. There are no restrictions for this procedure. Please do NOT wear cologne, perfume, aftershave, or lotions (deodorant is allowed). Please arrive 15 minutes prior to your appointment time.  Follow-Up: At [facility], you and your health needs are our priority.  As part of our continuing mission to provide you with exceptional heart care, we have created designated Provider Care Teams.  These Care Teams include your primary Cardiologist (physician) and Advanced Practice Providers (APPs -  Physician Assistants and Nurse Practitioners) who all work together to provide you with the care you need, when you need it.  We recommend signing up for the patient portal called "MyChart".  Sign up information is provided on this After Visit Summary.  MyChart is used to connect with patients for  Virtual Visits (Telemedicine).  Patients are able to view lab/test results, encounter notes, upcoming appointments, etc.  Non-urgent messages can be sent to your provider as well.   To learn more about what you can do with MyChart, go to [email or web address].    Your next appointment:   12 month(s)  Provider:   Donato Heinz, MD

## 2022-04-16 DIAGNOSIS — E103593 Type 1 diabetes mellitus with proliferative diabetic retinopathy without macular edema, bilateral: Secondary | ICD-10-CM | POA: Diagnosis not present

## 2022-04-16 DIAGNOSIS — H25813 Combined forms of age-related cataract, bilateral: Secondary | ICD-10-CM | POA: Diagnosis not present

## 2022-04-16 DIAGNOSIS — H47011 Ischemic optic neuropathy, right eye: Secondary | ICD-10-CM | POA: Diagnosis not present

## 2022-04-21 ENCOUNTER — Other Ambulatory Visit: Payer: Self-pay

## 2022-04-21 DIAGNOSIS — E108 Type 1 diabetes mellitus with unspecified complications: Secondary | ICD-10-CM

## 2022-04-21 MED ORDER — "DROPLET INSULIN SYRINGE 30G X 1/2"" 0.3 ML MISC": 2 refills | Status: DC

## 2022-04-27 ENCOUNTER — Other Ambulatory Visit: Payer: Self-pay | Admitting: Endocrinology

## 2022-04-27 DIAGNOSIS — E1065 Type 1 diabetes mellitus with hyperglycemia: Secondary | ICD-10-CM

## 2022-04-29 DIAGNOSIS — H25811 Combined forms of age-related cataract, right eye: Secondary | ICD-10-CM | POA: Diagnosis not present

## 2022-04-29 DIAGNOSIS — Z8546 Personal history of malignant neoplasm of prostate: Secondary | ICD-10-CM | POA: Diagnosis not present

## 2022-04-29 DIAGNOSIS — E1136 Type 2 diabetes mellitus with diabetic cataract: Secondary | ICD-10-CM | POA: Diagnosis not present

## 2022-04-29 DIAGNOSIS — I10 Essential (primary) hypertension: Secondary | ICD-10-CM | POA: Diagnosis not present

## 2022-04-29 DIAGNOSIS — Z7982 Long term (current) use of aspirin: Secondary | ICD-10-CM | POA: Diagnosis not present

## 2022-04-29 DIAGNOSIS — Z794 Long term (current) use of insulin: Secondary | ICD-10-CM | POA: Diagnosis not present

## 2022-04-30 DIAGNOSIS — E103593 Type 1 diabetes mellitus with proliferative diabetic retinopathy without macular edema, bilateral: Secondary | ICD-10-CM | POA: Diagnosis not present

## 2022-04-30 DIAGNOSIS — H25812 Combined forms of age-related cataract, left eye: Secondary | ICD-10-CM | POA: Diagnosis not present

## 2022-04-30 DIAGNOSIS — H47011 Ischemic optic neuropathy, right eye: Secondary | ICD-10-CM | POA: Diagnosis not present

## 2022-04-30 DIAGNOSIS — Z9841 Cataract extraction status, right eye: Secondary | ICD-10-CM | POA: Diagnosis not present

## 2022-05-07 DIAGNOSIS — E103593 Type 1 diabetes mellitus with proliferative diabetic retinopathy without macular edema, bilateral: Secondary | ICD-10-CM | POA: Diagnosis not present

## 2022-05-07 DIAGNOSIS — H25812 Combined forms of age-related cataract, left eye: Secondary | ICD-10-CM | POA: Diagnosis not present

## 2022-05-07 DIAGNOSIS — Z9841 Cataract extraction status, right eye: Secondary | ICD-10-CM | POA: Diagnosis not present

## 2022-05-07 DIAGNOSIS — H47011 Ischemic optic neuropathy, right eye: Secondary | ICD-10-CM | POA: Diagnosis not present

## 2022-05-13 DIAGNOSIS — H25812 Combined forms of age-related cataract, left eye: Secondary | ICD-10-CM | POA: Diagnosis not present

## 2022-05-14 DIAGNOSIS — H47011 Ischemic optic neuropathy, right eye: Secondary | ICD-10-CM | POA: Diagnosis not present

## 2022-05-14 DIAGNOSIS — Z9842 Cataract extraction status, left eye: Secondary | ICD-10-CM | POA: Diagnosis not present

## 2022-05-14 DIAGNOSIS — Z9841 Cataract extraction status, right eye: Secondary | ICD-10-CM | POA: Diagnosis not present

## 2022-05-14 DIAGNOSIS — E103593 Type 1 diabetes mellitus with proliferative diabetic retinopathy without macular edema, bilateral: Secondary | ICD-10-CM | POA: Diagnosis not present

## 2022-05-18 ENCOUNTER — Telehealth (HOSPITAL_BASED_OUTPATIENT_CLINIC_OR_DEPARTMENT_OTHER): Payer: Self-pay | Admitting: Cardiology

## 2022-05-18 ENCOUNTER — Other Ambulatory Visit (HOSPITAL_BASED_OUTPATIENT_CLINIC_OR_DEPARTMENT_OTHER): Payer: BC Managed Care – PPO

## 2022-05-18 NOTE — Telephone Encounter (Signed)
Left message for patient to call and discuss rescheduling the Echocardiogram appointment that was missed 05/18/22

## 2022-05-18 NOTE — Telephone Encounter (Signed)
FYI

## 2022-05-18 NOTE — Telephone Encounter (Signed)
Patient is returning call. He states he recently had cataracts surgery and he is unable to drive. He states that he will call back in about one month to reschedule--FYI.

## 2022-05-24 DIAGNOSIS — Z961 Presence of intraocular lens: Secondary | ICD-10-CM | POA: Insufficient documentation

## 2022-05-28 DIAGNOSIS — Z9842 Cataract extraction status, left eye: Secondary | ICD-10-CM | POA: Diagnosis not present

## 2022-05-28 DIAGNOSIS — H47011 Ischemic optic neuropathy, right eye: Secondary | ICD-10-CM | POA: Diagnosis not present

## 2022-05-28 DIAGNOSIS — Z9841 Cataract extraction status, right eye: Secondary | ICD-10-CM | POA: Diagnosis not present

## 2022-05-28 DIAGNOSIS — E103593 Type 1 diabetes mellitus with proliferative diabetic retinopathy without macular edema, bilateral: Secondary | ICD-10-CM | POA: Diagnosis not present

## 2022-06-01 NOTE — Telephone Encounter (Signed)
Patient wants [place] to call back the first part of June to discuss scheduling the Echocardiogram ordered by Dr. Bjorn Pippin

## 2022-06-08 ENCOUNTER — Other Ambulatory Visit: Payer: BC Managed Care – PPO

## 2022-06-09 ENCOUNTER — Other Ambulatory Visit (INDEPENDENT_AMBULATORY_CARE_PROVIDER_SITE_OTHER): Payer: BC Managed Care – PPO

## 2022-06-09 DIAGNOSIS — C61 Malignant neoplasm of prostate: Secondary | ICD-10-CM | POA: Diagnosis not present

## 2022-06-09 DIAGNOSIS — E538 Deficiency of other specified B group vitamins: Secondary | ICD-10-CM

## 2022-06-09 DIAGNOSIS — E559 Vitamin D deficiency, unspecified: Secondary | ICD-10-CM | POA: Diagnosis not present

## 2022-06-09 DIAGNOSIS — E108 Type 1 diabetes mellitus with unspecified complications: Secondary | ICD-10-CM | POA: Diagnosis not present

## 2022-06-09 LAB — HEPATIC FUNCTION PANEL
ALT: 16 U/L (ref 0–53)
AST: 15 U/L (ref 0–37)
Albumin: 3.7 g/dL (ref 3.5–5.2)
Alkaline Phosphatase: 72 U/L (ref 39–117)
Bilirubin, Direct: 0.1 mg/dL (ref 0.0–0.3)
Total Bilirubin: 0.5 mg/dL (ref 0.2–1.2)
Total Protein: 6.3 g/dL (ref 6.0–8.3)

## 2022-06-09 LAB — LIPID PANEL
Cholesterol: 151 mg/dL (ref 0–200)
HDL: 58.4 mg/dL (ref >39.00)
LDL Cholesterol: 75 mg/dL (ref 0–99)
NonHDL: 92.88
Total CHOL/HDL Ratio: 3
Triglycerides: 91 mg/dL (ref 0.0–149.0)
VLDL: 18.2 mg/dL (ref 0.0–40.0)

## 2022-06-09 LAB — CBC WITH DIFFERENTIAL/PLATELET
Basophils Absolute: 0.1 10*3/uL (ref 0.0–0.1)
Basophils Relative: 1.3 % (ref 0.0–3.0)
Eosinophils Absolute: 0.2 10*3/uL (ref 0.0–0.7)
Eosinophils Relative: 2.3 % (ref 0.0–5.0)
HCT: 44.1 % (ref 39.0–52.0)
Hemoglobin: 14.9 g/dL (ref 13.0–17.0)
Lymphocytes Relative: 27.5 % (ref 12.0–46.0)
Lymphs Abs: 2.2 10*3/uL (ref 0.7–4.0)
MCHC: 33.9 g/dL (ref 30.0–36.0)
MCV: 93.3 fl (ref 78.0–100.0)
Monocytes Absolute: 0.7 10*3/uL (ref 0.1–1.0)
Monocytes Relative: 9 % (ref 3.0–12.0)
Neutro Abs: 4.8 10*3/uL (ref 1.4–7.7)
Neutrophils Relative %: 59.9 % (ref 43.0–77.0)
Platelets: 256 10*3/uL (ref 150.0–400.0)
RBC: 4.72 Mil/uL (ref 4.22–5.81)
RDW: 14.2 % (ref 11.5–15.5)
WBC: 8 10*3/uL (ref 4.0–10.5)

## 2022-06-09 LAB — BASIC METABOLIC PANEL
BUN: 15 mg/dL (ref 6–23)
CO2: 25 mEq/L (ref 19–32)
Calcium: 8.9 mg/dL (ref 8.4–10.5)
Chloride: 105 mEq/L (ref 96–112)
Creatinine, Ser: 0.8 mg/dL (ref 0.40–1.50)
GFR: 93.45 mL/min (ref >60.00)
Glucose, Bld: 133 mg/dL — ABNORMAL HIGH (ref 70–99)
Potassium: 4 mEq/L (ref 3.5–5.1)
Sodium: 140 mEq/L (ref 135–145)

## 2022-06-09 LAB — HEMOGLOBIN A1C: Hgb A1c MFr Bld: 7.1 % — ABNORMAL HIGH (ref 4.6–6.5)

## 2022-06-09 LAB — MICROALBUMIN / CREATININE URINE RATIO
Creatinine,U: 28.8 mg/dL
Microalb Creat Ratio: 2.4 mg/g (ref 0.0–30.0)
Microalb, Ur: <0.7 mg/dL (ref 0.0–1.9)

## 2022-06-09 LAB — URINALYSIS, ROUTINE W REFLEX MICROSCOPIC
Bilirubin Urine: NEGATIVE
Hgb urine dipstick: NEGATIVE
Ketones, ur: NEGATIVE
Leukocytes,Ua: NEGATIVE
Nitrite: NEGATIVE
RBC / HPF: NONE SEEN (ref 0–?)
Specific Gravity, Urine: 1.01 (ref 1.000–1.030)
Total Protein, Urine: NEGATIVE
Urine Glucose: NEGATIVE
Urobilinogen, UA: 0.2 (ref 0.0–1.0)
WBC, UA: NONE SEEN (ref 0–?)
pH: 7 (ref 5.0–8.0)

## 2022-06-09 LAB — TSH: TSH: 1.36 u[IU]/mL (ref 0.35–5.50)

## 2022-06-09 LAB — PSA: PSA: 0 ng/mL — ABNORMAL LOW (ref 0.10–4.00)

## 2022-06-09 LAB — VITAMIN D 25 HYDROXY (VIT D DEFICIENCY, FRACTURES): VITD: 31.17 ng/mL (ref 30.00–100.00)

## 2022-06-09 LAB — VITAMIN B12: Vitamin B-12: 603 pg/mL (ref 211–911)

## 2022-06-09 NOTE — Progress Notes (Signed)
The test results show that your current treatment is OK, as the tests are stable.  Please continue the same plan.  There is no other need for change of treatment or further evaluation based on these results, at this time.  thanks 

## 2022-06-11 ENCOUNTER — Ambulatory Visit: Payer: BC Managed Care – PPO | Admitting: "Endocrinology

## 2022-06-11 ENCOUNTER — Encounter: Payer: Self-pay | Admitting: "Endocrinology

## 2022-06-11 VITALS — BP 122/78 | HR 83 | Ht 68.0 in | Wt 212.6 lb

## 2022-06-11 DIAGNOSIS — E7800 Pure hypercholesterolemia, unspecified: Secondary | ICD-10-CM

## 2022-06-11 DIAGNOSIS — E10649 Type 1 diabetes mellitus with hypoglycemia without coma: Secondary | ICD-10-CM | POA: Diagnosis not present

## 2022-06-11 MED ORDER — GVOKE HYPOPEN 1-PACK 1 MG/0.2ML ~~LOC~~ SOAJ: 1.0000 mg | SUBCUTANEOUS | 2 refills | Status: AC | PRN

## 2022-06-11 NOTE — Progress Notes (Signed)
Outpatient Endocrinology Note Altamese [place], MD  06/11/22   Donald Lopez Nov 21, 1955 [id number]  Referring Provider: Corwin Levins, MD Primary Care Provider: Corwin Levins, MD Reason for consultation: Subjective   Assessment & Plan  Diagnoses and all orders for this visit:  Uncontrolled type 1 diabetes mellitus with hypoglycemia without coma (HCC)  Pure hypercholesterolemia  Other orders -     Glucagon (GVOKE HYPOPEN 1-PACK) 1 MG/0.2ML SOAJ; Inject 1 mg into the skin as needed (low blood sugar with imapoired consciousness).    Diabetes complicated by retinopathy Hba1c goal less than 7.0, current Hba1c is 7.1. Will recommend: Tresiba pen 40 units qam NovoLog vial 3 to 5 units mostly at meals Eat snack before taking dog for walk in morning to prevent occasional low Has alarm set up for lows at 80  No known contraindications to any of above medications Glucagon described and sent on 06/11/22  Hyperlipidemia -Last LDL near goal: 75 -on atorvastatin 80 mg QD -Follow low fat diet and exercise   -Blood pressure goal <140/90 - Microalbumin/creatinine at goal < 30 - on ACE/ARB lisinopril 20 mg qd  -diet changes including salt restriction -limit eating outside -counseled BP targets per standards of diabetes care -Uncontrolled blood pressure can lead to retinopathy, nephropathy and cardiovascular and atherosclerotic heart disease  Reviewed and counseled on: -A1C target -Blood sugar targets -Complications of uncontrolled diabetes  -Checking blood sugar before meals and bedtime and bring log next visit -All medications with mechanism of action and side effects -Hypoglycemia management: rule of 15's, Glucagon Emergency Kit and medical alert ID -low-carb low-fat plate-method diet -At least 20 minutes of physical activity per day -Annual dilated retinal eye exam and foot exam -compliance and follow up needs -follow up as scheduled or earlier if problem gets  worse  Call if blood sugar is less than 70 or consistently above 250    Take a 15 gm snack of carbohydrate at bedtime before you go to sleep if your blood sugar is less than 100.    If you are going to fast after midnight for a test or procedure, ask your physician for instructions on how to reduce/decrease your insulin dose.    Call if blood sugar is less than 70 or consistently above 250  -Treating a low sugar by rule of 15  (15 gms of sugar every 15 min until sugar is more than 70) If you feel your sugar is low, test your sugar to be sure If your sugar is low (less than 70), then take 15 grams of a fast acting Carbohydrate (3-4 glucose tablets or glucose gel or 4 ounces of juice or regular soda) Recheck your sugar 15 min after treating low to make sure it is more than 70 If sugar is still less than 70, treat again with 15 grams of carbohydrate          Don't drive the hour of hypoglycemia  If unconscious/unable to eat or drink by mouth, use glucagon injection or nasal spray baqsimi and call 911. Can repeat again in 15 min if still unconscious.  Return in about 4 months (around 10/12/2022).   I have reviewed current medications, nurse's notes, allergies, vital signs, past medical and surgical history, family medical history, and social history for this encounter. Counseled patient on symptoms, examination findings, lab findings, imaging results, treatment decisions and monitoring and prognosis. The patient understood the recommendations and agrees with the treatment plan. All questions regarding treatment plan  were fully answered.  [place], MD  06/11/22    History of Present Illness Donald Lopez is a 67 y.o. year old male who presents for evaluation of Type 1 diabetes mellitus.  Donald Lopez was first diagnosed in 1985.   Diabetes education +  Home diabetes regimen: Tresiba 40 units qam NovoLog 3 to 5 units mostly at meals  Prior history: He has been on  multiple injections since diagnosis A1c in the last few years has ranged between 7.9 and 8.7   Recent history:   His A1c is significantly better at 7.2 compared to 8.5 on his initial visit and previously best was 7.9    He has been on [place] instead of Lantus since his last visit  COMPLICATIONS -  MI/Stroke + retinopathy, last eye exam 2024 -  neuropathy -  nephropathy  BLOOD SUGAR DATA  CGM interpretation: At today's visit, we reviewed her CGM downloads. The full report is scanned in the media. Reviewing the CGM trends, BG well controlled except around 8 am and 4 pm.  Physical Exam  BP 122/78   Pulse 83   Ht 5\' 8"  (1.727 m)   Wt 212 lb 9.6 oz (96.4 kg)   SpO2 98%   BMI 32.33 kg/m    Constitutional: well developed, well nourished Head: normocephalic, atraumatic Eyes: sclera anicteric, no redness Neck: supple Lungs: normal respiratory effort Neurology: alert and oriented Skin: dry, no appreciable rashes Musculoskeletal: no appreciable defects Psychiatric: normal mood and affect Diabetic Foot Exam - Simple   No data filed      Current Medications Patient's Medications  New Prescriptions   GLUCAGON (GVOKE HYPOPEN 1-PACK) 1 MG/0.2ML SOAJ    Inject 1 mg into the skin as needed (low blood sugar with imapoired consciousness).  Previous Medications   ACCU-CHEK GUIDE TEST STRIP    CHECK BLOOD SUGAR FIVE TIMES DAILY   ACCU-CHEK SOFTCLIX LANCETS LANCETS    Use to check blood sugar 5 times per day. Dx Code: E10.8   ALCOHOL SWABS (B-D SINGLE USE SWABS REGULAR) PADS    USE AS DIRECTED   AMLODIPINE (NORVASC) 5 MG TABLET    TAKE ONE TABLET BY MOUTH ONCE DAILY. please schedule appontment,   ASPIRIN EC 81 MG TABLET    Take 81 mg by mouth daily.   ATORVASTATIN (LIPITOR) 80 MG TABLET    Take 1 tablet (80 mg total) by mouth daily.   BLOOD GLUCOSE CALIBRATION (ACCU-CHEK GUIDE CONTROL) LIQD    USE AS DIRECTED WITH GLUCOSE METER   BLOOD GLUCOSE MONITORING SUPPL (ACCU-CHEK GUIDE)  W/DEVICE KIT    USE AS DIRECTED   CHOLECALCIFEROL (VITAMIN D3) 25 MCG (1000 UNIT) TABLET    Take 1,000 Units by mouth daily.   CONTINUOUS BLOOD GLUC SENSOR (FREESTYLE LIBRE 3 SENSOR) MISC    1 applicator by Does not apply route every 14 (fourteen) days. Place 1 sensor on the skin every 14 days. Use to check glucose continuously   CYANOCOBALAMIN (VITAMIN B12) 1000 MCG TABLET    Take 1,000 mcg by mouth 2 (two) times a week.   GLUCOSE BLOOD (ACCU-CHEK AVIVA PLUS) TEST STRIP    Check blood sugar 3 times a day   INSULIN DEGLUDEC (TRESIBA FLEXTOUCH) 200 UNIT/ML FLEXTOUCH PEN    inject 40 units daily   INSULIN PEN NEEDLE (PEN NEEDLES) 33G X 4 MM MISC    Use to inject tresiba daily   INSULIN SYRINGE-NEEDLE U-100 (DROPLET INSULIN SYRINGE) 30G X 1/2" 0.3 ML MISC  USE AS DIRECTED FOUR TIMES A DAY   LISINOPRIL (ZESTRIL) 20 MG TABLET    TAKE ONE TABLET ONCE DAILY   NOVOLOG 100 UNIT/ML INJECTION    INJECT 2-5 UNITS SUBCUTANEOUSLY THREE TIMES DAILY BEFORE MEALS   SILDENAFIL (VIAGRA) 100 MG TABLET    Take 1 tablet (100 mg total) by mouth daily as needed for erectile dysfunction.   VITAMIN D, CHOLECALCIFEROL, PO    Take 1 capsule by mouth daily.  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 s/p PRP OD  s/p PPV with laser and cryo OS 5/10    TOBACCO USE, QUIT 11/17/2006   Trigger finger    VARICOSE VEINS, LOWER EXTREMITIES 11/17/2006    Past Surgical History Past Surgical History:  Procedure Laterality Date   CHOLECYSTECTOMY     KNEE ARTHROSCOPY     Left   PROSTATE SURGERY  10/19/2014   [facility] [place]/ Dr. Ivin Booty    SHOULDER SURGERY     Left   VASECTOMY     VITRECTOMY  01/19/2007   Right   VITRECTOMY      Family History family history includes Cancer in his father; Hypertension in his mother; Varicose Veins in his maternal aunt and maternal grandmother.  Social History Social History   Socioeconomic History   Marital status: Married    Spouse name: Education officer, environmental   Number of children: 3   Years of education: Not on file   Highest education level: Not on file  Occupational History   Occupation: Research officer, political party  Tobacco Use   Smoking status: Never   Smokeless tobacco: Never  Substance and Sexual Activity   Alcohol use: Yes    Comment: 5-6/week   Drug use: No   Sexual activity: Not on file  Other Topics Concern   Not on file  Social History Narrative   Not on file   Social Determinants of Health   Financial Resource Strain: Not on file  Food Insecurity: Not on file  Transportation Needs: Not on file  Physical Activity: Not on file  Stress: Not on file  Social Connections: Not on file  Intimate Partner Violence: Not on file    Lab Results  Component Value Date   HGBA1C 7.1 (H) 06/09/2022   HGBA1C 7.2 (H) 01/25/2022   HGBA1C 7.4 (H) 01/04/2022   Lab Results  Component Value Date   CHOL 151 06/09/2022   Lab Results  Component Value Date   HDL 58.40 06/09/2022   Lab Results  Component Value Date   LDLCALC 75 06/09/2022   Lab Results  Component Value Date   TRIG 91.0 06/09/2022   Lab Results  Component Value Date   CHOLHDL 3 06/09/2022   Lab Results  Component Value Date   CREATININE 0.80 06/09/2022   Lab Results  Component  Value Date   GFR 93.45  06/09/2022   Lab Results  Component Value Date   MICROALBUR <0.7 06/09/2022      Component Value Date/Time   NA 140 06/09/2022 1530   NA 142 11/23/2019 0908   K 4.0 06/09/2022 1530   CL 105 06/09/2022 1530   CO2 25 06/09/2022 1530   GLUCOSE 133 (H) 06/09/2022 1530   BUN 15 06/09/2022 1530   BUN 13 11/23/2019 0908   CREATININE 0.80 06/09/2022 1530   CALCIUM 8.9 06/09/2022 1530   PROT 6.3 06/09/2022 1530   ALBUMIN 3.7 06/09/2022 1530   AST 15 06/09/2022 1530   ALT 16 06/09/2022 1530   ALKPHOS 72 06/09/2022 1530   BILITOT 0.5 06/09/2022 1530   GFRNONAA 100 11/23/2019 0908   GFRAA 116 11/23/2019 0908      Latest Ref Rng & Units 06/09/2022    3:30 PM 01/25/2022    8:51 AM 01/04/2022    2:39 PM  BMP  Glucose 70 - 99 mg/dL 295  621  308   BUN 6 - 23 mg/dL 15  14  13    Creatinine 0.40 - 1.50 mg/dL 6.57  8.46  9.62   Sodium 135 - 145 mEq/L 140  141  140   Potassium 3.5 - 5.1 mEq/L 4.0  4.7  4.0   Chloride 96 - 112 mEq/L 105  104  104   CO2 19 - 32 mEq/L 25  30  27    Calcium 8.4 - 10.5 mg/dL 8.9  9.4  9.1        Component Value Date/Time   WBC 8.0 06/09/2022 1530   RBC 4.72 06/09/2022 1530   HGB 14.9 06/09/2022 1530   HCT 44.1 06/09/2022 1530   PLT 256.0 06/09/2022 1530   MCV 93.3 06/09/2022 1530   MCH 30.8 02/25/2017 1149   MCHC 33.9 06/09/2022 1530   RDW 14.2 06/09/2022 1530   LYMPHSABS 2.2 06/09/2022 1530   MONOABS 0.7 06/09/2022 1530   EOSABS 0.2 06/09/2022 1530   BASOSABS 0.1 06/09/2022 1530     Parts of this note may have been dictated using voice recognition software. There may be variances in spelling and vocabulary which are unintentional. Not all errors are proofread. Please notify the Thereasa Parkin if any discrepancies are noted or if the meaning of any statement is not clear.

## 2022-06-12 ENCOUNTER — Other Ambulatory Visit: Payer: Self-pay | Admitting: Endocrinology

## 2022-06-12 DIAGNOSIS — E1065 Type 1 diabetes mellitus with hyperglycemia: Secondary | ICD-10-CM

## 2022-06-21 DIAGNOSIS — M65841 Other synovitis and tenosynovitis, right hand: Secondary | ICD-10-CM | POA: Diagnosis not present

## 2022-07-01 DIAGNOSIS — H47011 Ischemic optic neuropathy, right eye: Secondary | ICD-10-CM | POA: Diagnosis not present

## 2022-07-01 DIAGNOSIS — E103593 Type 1 diabetes mellitus with proliferative diabetic retinopathy without macular edema, bilateral: Secondary | ICD-10-CM | POA: Diagnosis not present

## 2022-07-01 DIAGNOSIS — Z961 Presence of intraocular lens: Secondary | ICD-10-CM | POA: Diagnosis not present

## 2022-07-01 DIAGNOSIS — H4311 Vitreous hemorrhage, right eye: Secondary | ICD-10-CM | POA: Diagnosis not present

## 2022-07-03 ENCOUNTER — Other Ambulatory Visit: Payer: Self-pay | Admitting: Endocrinology

## 2022-07-03 DIAGNOSIS — E108 Type 1 diabetes mellitus with unspecified complications: Secondary | ICD-10-CM

## 2022-07-12 DIAGNOSIS — M65321 Trigger finger, right index finger: Secondary | ICD-10-CM | POA: Diagnosis not present

## 2022-07-12 DIAGNOSIS — M65311 Trigger thumb, right thumb: Secondary | ICD-10-CM | POA: Diagnosis not present

## 2022-07-16 ENCOUNTER — Other Ambulatory Visit: Payer: Self-pay | Admitting: Cardiology

## 2022-07-16 DIAGNOSIS — H26493 Other secondary cataract, bilateral: Secondary | ICD-10-CM | POA: Diagnosis not present

## 2022-07-16 DIAGNOSIS — H47011 Ischemic optic neuropathy, right eye: Secondary | ICD-10-CM | POA: Diagnosis not present

## 2022-07-16 DIAGNOSIS — E103593 Type 1 diabetes mellitus with proliferative diabetic retinopathy without macular edema, bilateral: Secondary | ICD-10-CM | POA: Diagnosis not present

## 2022-07-16 DIAGNOSIS — Z9841 Cataract extraction status, right eye: Secondary | ICD-10-CM | POA: Diagnosis not present

## 2022-07-28 ENCOUNTER — Ambulatory Visit (INDEPENDENT_AMBULATORY_CARE_PROVIDER_SITE_OTHER): Payer: BC Managed Care – PPO

## 2022-07-28 DIAGNOSIS — Z8279 Family history of other congenital malformations, deformations and chromosomal abnormalities: Secondary | ICD-10-CM

## 2022-07-28 LAB — ECHOCARDIOGRAM COMPLETE: Area-P 1/2: 4.21 cm2

## 2022-08-27 DIAGNOSIS — H26493 Other secondary cataract, bilateral: Secondary | ICD-10-CM | POA: Diagnosis not present

## 2022-09-06 ENCOUNTER — Other Ambulatory Visit: Payer: Self-pay

## 2022-09-06 DIAGNOSIS — E1065 Type 1 diabetes mellitus with hyperglycemia: Secondary | ICD-10-CM

## 2022-09-06 MED ORDER — TRESIBA FLEXTOUCH 200 UNIT/ML ~~LOC~~ SOPN
PEN_INJECTOR | SUBCUTANEOUS | 2 refills | Status: DC
Start: 2022-09-06 — End: 2023-01-10

## 2022-09-13 ENCOUNTER — Other Ambulatory Visit: Payer: Self-pay | Admitting: Endocrinology

## 2022-09-13 DIAGNOSIS — E1065 Type 1 diabetes mellitus with hyperglycemia: Secondary | ICD-10-CM

## 2022-09-17 ENCOUNTER — Other Ambulatory Visit: Payer: Self-pay

## 2022-09-17 DIAGNOSIS — E1065 Type 1 diabetes mellitus with hyperglycemia: Secondary | ICD-10-CM

## 2022-09-17 MED ORDER — FREESTYLE LIBRE 3 SENSOR MISC
3 refills | Status: DC
Start: 2022-09-17 — End: 2023-02-14

## 2022-09-23 ENCOUNTER — Ambulatory Visit (INDEPENDENT_AMBULATORY_CARE_PROVIDER_SITE_OTHER): Payer: BC Managed Care – PPO

## 2022-09-23 ENCOUNTER — Encounter (HOSPITAL_COMMUNITY): Payer: Self-pay

## 2022-09-23 ENCOUNTER — Ambulatory Visit (INDEPENDENT_AMBULATORY_CARE_PROVIDER_SITE_OTHER): Payer: BC Managed Care – PPO | Admitting: Podiatry

## 2022-09-23 ENCOUNTER — Telehealth: Payer: Self-pay | Admitting: Podiatry

## 2022-09-23 DIAGNOSIS — I872 Venous insufficiency (chronic) (peripheral): Secondary | ICD-10-CM | POA: Diagnosis not present

## 2022-09-23 DIAGNOSIS — L84 Corns and callosities: Secondary | ICD-10-CM

## 2022-09-23 DIAGNOSIS — M7752 Other enthesopathy of left foot: Secondary | ICD-10-CM

## 2022-09-23 DIAGNOSIS — R609 Edema, unspecified: Secondary | ICD-10-CM | POA: Diagnosis not present

## 2022-09-23 DIAGNOSIS — I739 Peripheral vascular disease, unspecified: Secondary | ICD-10-CM

## 2022-09-23 NOTE — Progress Notes (Signed)
Subjective:   Patient ID: Donald Lopez, male   DOB: 67 y.o.   MRN: [id number]   HPI Chief Complaint  Patient presents with   Callouses    Corn to bilateral feel lateral aspect of feet. Left foot is painful the corn on the right foot is not painful. Patient is diabetic. He trims his own nails.    Foot Swelling    Ankle and foot swelling. Denies any pain at this time. He does have varicose veins and he was using compression sleeve which helps with the swelling.     67 year old male presents the office for above concerns.  The swelling is from an old injury in high school and it has swelling more. No pain with the swelling. He does have varicose viens. He has compression sleaves he does not wear all the itme. When it swells more he will wear it and the swellig improves. He has not worn it the last few few days. He has not seen a pain specialist previously.  He also has a hyperkeratotic lesion on the left and right submetatarsal 5.  No ulceration reports or any drainage.  He is a type 1 diabetic. His A1c was 7.1   Review of Systems  All other systems reviewed and are negative.  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 s/p PRP OD  s/p PPV with laser and cryo OS 5/10   TOBACCO USE, QUIT 11/17/2006   Trigger finger    VARICOSE VEINS, LOWER EXTREMITIES 11/17/2006    Past Surgical History:  Procedure Laterality Date   CHOLECYSTECTOMY     KNEE ARTHROSCOPY     Left   PROSTATE SURGERY  10/19/2014   [facility]/  Dr. Ivin Booty    SHOULDER SURGERY     Left   VASECTOMY     VITRECTOMY  01/19/2007   Right   VITRECTOMY       Current Outpatient Medications:    ACCU-CHEK GUIDE test strip, CHECK BLOOD SUGAR FIVE TIMES DAILY, Disp: 450 strip, Rfl: 0   Accu-Chek Softclix Lancets lancets, Use to check blood sugar 5 times per day. Dx Code: E10.8, Disp: 100 each, Rfl: 12   Alcohol Swabs (B-D SINGLE USE SWABS REGULAR) PADS, USE AS DIRECTED, Disp: 100 each, Rfl: 0   amLODipine (NORVASC) 5 MG tablet, TAKE ONE TABLET BY MOUTH ONCE DAILY. please schedule appontment,, Disp: 90 tablet, Rfl: 2   aspirin EC 81 MG tablet, Take 81 mg by mouth daily., Disp: , Rfl:    atorvastatin (LIPITOR) 80 MG tablet, Take 1 tablet (80 mg total) by mouth daily., Disp: 90 tablet, Rfl: 3   Blood Glucose Calibration (ACCU-CHEK GUIDE CONTROL) LIQD, USE AS DIRECTED WITH GLUCOSE METER, Disp: 1 each, Rfl: 3   Blood Glucose Monitoring Suppl (ACCU-CHEK GUIDE) w/Device KIT, USE AS DIRECTED, Disp: 1 kit, Rfl: 0   cholecalciferol (VITAMIN D3) 25 MCG (1000 UNIT) tablet, Take 1,000 Units by mouth daily., Disp: , Rfl:    Continuous Glucose Sensor (FREESTYLE LIBRE 3 SENSOR) MISC, Change every 14 days,  Disp: 2 each, Rfl: 3   cyanocobalamin (VITAMIN B12) 1000 MCG tablet, Take 1,000 mcg by mouth 2 (two) times a week., Disp: , Rfl:    Glucagon (GVOKE HYPOPEN 1-PACK) 1 MG/0.2ML SOAJ, Inject 1 mg into the skin as needed (low blood sugar with imapoired consciousness)., Disp: 0.4 mL, Rfl: 2   glucose blood (ACCU-CHEK AVIVA PLUS) test strip, Check blood sugar 3 times a day, Disp: 100 each, Rfl: 3   insulin degludec (TRESIBA FLEXTOUCH) 200 UNIT/ML FlexTouch Pen, inject 40 units daily, Disp: 9 mL, Rfl: 2   Insulin Syringe-Needle U-100 (DROPLET INSULIN SYRINGE) 30G X 1/2" 0.3 ML MISC, USE AS DIRECTED FOUR TIMES A DAY, Disp: 400 each, Rfl: 2   lisinopril (ZESTRIL) 20 MG tablet, TAKE ONE TABLET ONCE DAILY, Disp: 90 tablet, Rfl: 3   NOVOLOG 100 UNIT/ML injection, inject  2-5 units subcutaneously three times daily before meals, Disp: 30 mL, Rfl: 0   sildenafil (VIAGRA) 100 MG tablet, Take 1 tablet (100 mg total) by mouth daily as needed for erectile dysfunction., Disp: 30 tablet, Rfl: 5   SURE COMFORT PEN NEEDLES 32G X 4 MM MISC, Use to inject tresiba daily, Disp: 100 each, Rfl: 1   VITAMIN D, CHOLECALCIFEROL, PO, Take 1 capsule by mouth daily., Disp: , Rfl:   Allergies  Allergen Reactions   Penicillins     REACTION: unspecified reaction         Objective:  Physical Exam  General: AAO x3, NAD  Dermatological: Hyperkeratotic lesion submetatarsal 5 bilaterally.  There is no underlying ulceration, drainage or any signs of infection noted today.  There are no open lesions noted.  Dry skin present to his feet.  Vascular: DP pulses 2/4, PT pulse 1/4.  Chronic appearing edema present to bilateral lower extremities.  There is no pain with calf compression, erythema or warmth.    Neruologic: Grossly intact via light touch bilateral.   Musculoskeletal: Prominence of metatarsal head plantarly.  There is no.  Pinpoint tenderness identified at this time.     Assessment:   67 year old male with venous insufficiency, hyperkeratotic lesions due to chronic metatarsal heads     Plan:  -Treatment options discussed including all alternatives, risks, and complications -Etiology of symptoms were discussed -X-rays were obtained and reviewed with the patient.  3 views of the ankle were obtained.  No evidence of acute fracture.  There is mild narrowing along the medial ankle gutter.  Vessel calcification is present. -I would have ABI evaluate circulation -Sharply debrided hyperkeratotic lesions with any complications or bleeding.  Discussed moisturizer, offloading.  We discussed various moisturizers in general for his dry skin. -Continue compression -Daily foot inspection  No follow-ups on file.  Vivi Barrack DPM

## 2022-09-23 NOTE — Telephone Encounter (Signed)
Since I could not get the insurance rep to give me information, as the NPI for [facility] was not coming up in their system and we were disconnected, Duncan from [facility] @ [place] called patient to have him check with insurance.  These are the details he obtained:  Patient responded, Patient stated no auth required Out of state number for [place] [employer]: [phone]  He has been scheduled for the ABI's / non-invasive studies now.

## 2022-09-23 NOTE — Patient Instructions (Signed)
Choose a moisturizer from  the list below:  For normal skin: Moisturize feet once daily; do not apply between toes A.  CeraVe Daily Moisturizing Lotion B.  Lubriderm Advanced Therapy Lotion or Lubriderm Intense Skin Repair Lotion C.  Vaseline Intensive Care Lotion D.  Gold Bond Ultimate Diabetic Foot Lotion E.  Eucerin Intensive Repair Moisturizing Lotion  For extremely dry, cracked feet: moisturize feet once daily; do not apply between toes A. CeraVe Healing Ointment B. Eucerin Aquaphor Repairing Ointment (may be labeled Aquaphor Healing Ointment) C. Vaseline Petroleum Healing Jelly   If you have problems reaching your feet: apply to feet once daily; do not apply between toes A.  Eucerin Aquaphor Ointment Body Spray  B.  Vaseline Intensive Care Spray Moisturizer (Unscented,  Cocoa Radiant Spray or Aloe Smooth Spray)    --  Diabetes Mellitus and Foot Care Diabetes, also called diabetes mellitus, may cause problems with your feet and legs because of poor blood flow (circulation). Poor circulation may make your skin: Become thinner and drier. Break more easily. Heal more slowly. Peel and crack. You may also have nerve damage (neuropathy). This can cause decreased feeling in your legs and feet. This means that you may not notice minor injuries to your feet that could lead to more serious problems. Finding and treating problems early is the best way to prevent future foot problems. How to care for your feet Foot hygiene  Wash your feet daily with warm water and mild soap. Do not use hot water. Then, pat your feet and the areas between your toes until they are fully dry. Do not soak your feet. This can dry your skin. Trim your toenails straight across. Do not dig under them or around the cuticle. File the edges of your nails with an emery board or nail file. Apply a moisturizing lotion or petroleum jelly to the skin on your feet and to dry, brittle toenails. Use lotion that does not  contain alcohol and is unscented. Do not apply lotion between your toes. Shoes and socks Wear clean socks or stockings every day. Make sure they are not too tight. Do not wear knee-high stockings. These may decrease blood flow to your legs. Wear shoes that fit well and have enough cushioning. Always look in your shoes before you put them on to be sure there are no objects inside. To break in new shoes, wear them for just a few hours a day. This prevents injuries on your feet. Wounds, scrapes, corns, and calluses  Check your feet daily for blisters, cuts, bruises, sores, and redness. If you cannot see the bottom of your feet, use a mirror or ask someone for help. Do not cut off corns or calluses or try to remove them with medicine. If you find a minor scrape, cut, or break in the skin on your feet, keep it and the skin around it clean and dry. You may clean these areas with mild soap and water. Do not clean the area with peroxide, alcohol, or iodine. If you have a wound, scrape, corn, or callus on your foot, look at it several times a day to make sure it is healing and not infected. Check for: Redness, swelling, or pain. Fluid or blood. Warmth. Pus or a bad smell. General tips Do not cross your legs. This may decrease blood flow to your feet. Do not use heating pads or hot water bottles on your feet. They may burn your skin. If you have lost feeling in your  feet or legs, you may not know this is happening until it is too late. Protect your feet from hot and cold by wearing shoes, such as at the beach or on hot pavement. Schedule a complete foot exam at least once a year or more often if you have foot problems. Report any cuts, sores, or bruises to your health care provider right away. Where to find more information American Diabetes Association: diabetes.org Association of Diabetes Care & Education Specialists: diabeteseducator.org Contact a health care provider if: You have a condition that  increases your risk of infection, and you have any cuts, sores, or bruises on your feet. You have an injury that is not healing. You have redness on your legs or feet. You feel burning or tingling in your legs or feet. You have pain or cramps in your legs and feet. Your legs or feet are numb. Your feet always feel cold. You have pain around any toenails. Get help right away if: You have a wound, scrape, corn, or callus on your foot and: You have signs of infection. You have a fever. You have a red line going up your leg. This information is not intended to replace advice given to you by your health care provider. Make sure you discuss any questions you have with your health care provider. Document Revised: 07/08/2021 Document Reviewed: 07/08/2021 Elsevier Patient Education  2024 [employer].

## 2022-09-27 ENCOUNTER — Ambulatory Visit (HOSPITAL_COMMUNITY): Payer: BC Managed Care – PPO

## 2022-10-01 ENCOUNTER — Ambulatory Visit (HOSPITAL_COMMUNITY)
Admission: RE | Admit: 2022-10-01 | Discharge: 2022-10-01 | Disposition: A | Discharge status: Home / Self Care | Payer: BC Managed Care – PPO | Source: Ambulatory Visit | Attending: Podiatry

## 2022-10-01 DIAGNOSIS — I739 Peripheral vascular disease, unspecified: Secondary | ICD-10-CM | POA: Diagnosis not present

## 2022-10-01 LAB — VAS US ABI WITH/WO TBI

## 2022-10-04 ENCOUNTER — Other Ambulatory Visit: Payer: Self-pay | Admitting: Cardiology

## 2022-10-11 DIAGNOSIS — Z9841 Cataract extraction status, right eye: Secondary | ICD-10-CM | POA: Diagnosis not present

## 2022-10-11 DIAGNOSIS — Z9889 Other specified postprocedural states: Secondary | ICD-10-CM | POA: Diagnosis not present

## 2022-10-11 DIAGNOSIS — E103593 Type 1 diabetes mellitus with proliferative diabetic retinopathy without macular edema, bilateral: Secondary | ICD-10-CM | POA: Diagnosis not present

## 2022-10-11 DIAGNOSIS — H26491 Other secondary cataract, right eye: Secondary | ICD-10-CM | POA: Diagnosis not present

## 2022-10-12 ENCOUNTER — Encounter: Payer: Self-pay | Admitting: Podiatry

## 2022-10-13 DIAGNOSIS — M25562 Pain in left knee: Secondary | ICD-10-CM | POA: Diagnosis not present

## 2022-10-15 ENCOUNTER — Encounter: Payer: Self-pay | Admitting: "Endocrinology

## 2022-10-15 ENCOUNTER — Telehealth (INDEPENDENT_AMBULATORY_CARE_PROVIDER_SITE_OTHER): Payer: BC Managed Care – PPO | Admitting: "Endocrinology

## 2022-10-15 DIAGNOSIS — E7800 Pure hypercholesterolemia, unspecified: Secondary | ICD-10-CM | POA: Diagnosis not present

## 2022-10-15 DIAGNOSIS — E10649 Type 1 diabetes mellitus with hypoglycemia without coma: Secondary | ICD-10-CM | POA: Diagnosis not present

## 2022-10-15 DIAGNOSIS — Z794 Long term (current) use of insulin: Secondary | ICD-10-CM

## 2022-10-15 NOTE — Progress Notes (Signed)
The patient reports they are currently: Donald Lopez. I spent 10-11 minutes on the video with the patient on the date of service. I spent an additional 10 minutes on pre- and post-visit activities on the date of service.   The patient was physically located in [place] or a state in which I am permitted to provide care. The patient and/or parent/guardian understood that s/he may incur co-pays and cost sharing, and agreed to the telemedicine visit. The visit was reasonable and appropriate under the circumstances given the patient's presentation at the time.  The patient and/or parent/guardian has been advised of the potential risks and limitations of this mode of treatment (including, but not limited to, the absence of in-person examination) and has agreed to be treated using telemedicine. The patient's/patient's family's questions regarding telemedicine have been answered.   The patient and/or parent/guardian has also been advised to contact their provider's office for worsening conditions, and seek emergency medical treatment and/or call 911 if the patient deems either necessary.    Outpatient Endocrinology Note Donald Donald Stickney, MD  10/15/22   Donald Lopez Jan 08, 1962 [id number]  Referring Provider: Corwin Levins, MD Primary Care Provider: Corwin Levins, MD Reason for consultation: Subjective   Assessment & Plan  Diagnoses and all orders for this visit:  Uncontrolled type 1 diabetes mellitus with hypoglycemia without coma (HCC) -     Hemoglobin A1c; Future  Long-term insulin use (HCC)  Pure hypercholesterolemia     Diabetes complicated by retinopathy Hba1c goal less than 7.0, current Hba1c is 7.1. Will recommend: Tresiba pen 40 units qam NovoLog vial 2-5 for break fast and lunch, 7-8 units with dinner mostly at meals Eat snack before taking dog for walk in morning to prevent occasional low Has alarm set up for lows at 80  No known contraindications to any of above  medications Glucagon described and sent on 06/11/22  Hyperlipidemia -Last LDL near goal: 75 -on atorvastatin 80 mg QD -Follow low fat diet and exercise   -Blood pressure goal <140/90 - Microalbumin/creatinine at goal < 30 - on ACE/ARB lisinopril 20 mg qd  -diet changes including salt restriction -limit eating outside -counseled BP targets per standards of diabetes care -Uncontrolled blood pressure can lead to retinopathy, nephropathy and cardiovascular and atherosclerotic heart disease  Reviewed and counseled on: -A1C target -Blood sugar targets -Complications of uncontrolled diabetes  -Checking blood sugar before meals and bedtime and bring log next visit -All medications with mechanism of action and side effects -Hypoglycemia management: rule of 15's, Glucagon Emergency Kit and medical alert ID -low-carb low-fat plate-method diet -At least 20 minutes of physical activity per day -Annual dilated retinal eye exam and foot exam -compliance and follow up needs -follow up as scheduled or earlier if problem gets worse  Call if blood sugar is less than 70 or consistently above 250    Take a 15 gm snack of carbohydrate at bedtime before you go to sleep if your blood sugar is less than 100.    If you are going to fast after midnight for a test or procedure, ask your physician for instructions on how to reduce/decrease your insulin dose.    Call if blood sugar is less than 70 or consistently above 250  -Treating a low sugar by rule of 15  (15 gms of sugar every 15 min until sugar is more than 70) If you feel your sugar is low, test your sugar to be sure If your sugar is low (less than  70), then take 15 grams of a fast acting Carbohydrate (3-4 glucose tablets or glucose gel or 4 ounces of juice or regular soda) Recheck your sugar 15 min after treating low to make sure it is more than 70 If sugar is still less than 70, treat again with 15 grams of carbohydrate          Don't drive the  hour of hypoglycemia  If unconscious/unable to eat or drink by mouth, use glucagon injection or nasal spray baqsimi and call 911. Can repeat again in 15 min if still unconscious.  Return in about 3 months (around 01/14/2023) for tele-visit: 3:20 pm, labs before next visit.   I have reviewed current medications, nurse's notes, allergies, vital signs, past medical and surgical history, family medical history, and social history for this encounter. Counseled patient on symptoms, examination findings, lab findings, imaging results, treatment decisions and monitoring and prognosis. The patient understood the recommendations and agrees with the treatment plan. All questions regarding treatment plan were fully answered.  Donald Zion, MD  10/15/22    History of Present Illness Donald Lopez is a 67 y.o. year old male who presents for evaluation of Type 1 diabetes mellitus.  Donald Lopez was first diagnosed in 1985.   Diabetes education +  Home diabetes regimen: Tresiba 40 units qam NovoLog vial 2-5 for break fast and lunch, 5-7 units with dinner mostly at meals  Prior history: He has been on multiple injections since diagnosis A1c in the last few years has ranged between 7.9 and 8.7   Recent history:   His A1c is significantly better at 7.2 compared to 8.5 on his initial visit and previously best was 7.9    He has been on [place] instead of Lantus since his last visit  COMPLICATIONS -  MI/Stroke + retinopathy, last eye exam 2024 -  neuropathy -  nephropathy  BLOOD SUGAR DATA  CGM interpretation: At today's visit, we reviewed her CGM downloads. The full report is scanned in the media. Reviewing the CGM trends, BG are elevated overnight.  Physical Exam  There were no vitals taken for this visit.   Constitutional: well developed, well nourished Head: normocephalic, atraumatic Eyes: sclera anicteric, no redness Neck: supple Lungs: normal respiratory  effort Neurology: alert and oriented Skin: dry, no appreciable rashes Musculoskeletal: no appreciable defects Psychiatric: normal mood and affect Diabetic Foot Exam - Simple   No data filed      Current Medications Patient's Medications  New Prescriptions   No medications on file  Previous Medications   ACCU-CHEK GUIDE TEST STRIP    CHECK BLOOD SUGAR FIVE TIMES DAILY   ACCU-CHEK SOFTCLIX LANCETS LANCETS    Use to check blood sugar 5 times per day. Dx Code: E10.8   ALCOHOL SWABS (B-D SINGLE USE SWABS REGULAR) PADS    USE AS DIRECTED   AMLODIPINE (NORVASC) 5 MG TABLET    Take 1 tablet (5 mg total) by mouth daily.   ASPIRIN EC 81 MG TABLET    Take 81 mg by mouth daily.   ATORVASTATIN (LIPITOR) 80 MG TABLET    Take 1 tablet (80 mg total) by mouth daily.   BLOOD GLUCOSE CALIBRATION (ACCU-CHEK GUIDE CONTROL) LIQD    USE AS DIRECTED WITH GLUCOSE METER   BLOOD GLUCOSE MONITORING SUPPL (ACCU-CHEK GUIDE) W/DEVICE KIT    USE AS DIRECTED   CHOLECALCIFEROL (VITAMIN D3) 25 MCG (1000 UNIT) TABLET    Take 1,000 Units by mouth daily.   CONTINUOUS  GLUCOSE SENSOR (FREESTYLE LIBRE 3 SENSOR) MISC    Change every 14 days   CYANOCOBALAMIN (VITAMIN B12) 1000 MCG TABLET    Take 1,000 mcg by mouth 2 (two) times a week.   GLUCAGON (GVOKE HYPOPEN 1-PACK) 1 MG/0.2ML SOAJ    Inject 1 mg into the skin as needed (low blood sugar with imapoired consciousness).   GLUCOSE BLOOD (ACCU-CHEK AVIVA PLUS) TEST STRIP    Check blood sugar 3 times a day   INSULIN DEGLUDEC (TRESIBA FLEXTOUCH) 200 UNIT/ML FLEXTOUCH PEN    inject 40 units daily   INSULIN SYRINGE-NEEDLE U-100 (DROPLET INSULIN SYRINGE) 30G X 1/2" 0.3 ML MISC    USE AS DIRECTED FOUR TIMES A DAY   LISINOPRIL (ZESTRIL) 20 MG TABLET    TAKE ONE TABLET ONCE DAILY   NOVOLOG 100 UNIT/ML INJECTION    inject 2-5 units subcutaneously three times daily before meals   SILDENAFIL (VIAGRA) 100 MG TABLET    Take 1 tablet (100 mg total) by mouth daily as needed for erectile  dysfunction.   SURE COMFORT PEN NEEDLES 32G X 4 MM MISC    Use to inject tresiba daily   VITAMIN D, CHOLECALCIFEROL, PO    Take 1 capsule by mouth daily.  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 s/p PRP OD  s/p PPV with laser and cryo OS 5/10   TOBACCO USE, QUIT 11/17/2006   Trigger finger    VARICOSE VEINS, LOWER EXTREMITIES 11/17/2006    Past Surgical History Past Surgical History:  Procedure Laterality Date   CHOLECYSTECTOMY     KNEE ARTHROSCOPY     Left   PROSTATE SURGERY  10/19/2014   [facility]/ Dr. Ivin Booty    SHOULDER SURGERY     Left   VASECTOMY     VITRECTOMY  01/19/2007   Right   VITRECTOMY      Family History family history includes Cancer in his father; Hypertension in his mother; Varicose Veins in his maternal aunt and maternal grandmother.  Social History Social History   Socioeconomic History   Marital status: Married    Spouse name: Education officer, environmental   Number of children: 3   Years of education: Not on file   Highest education level: Not on file  Occupational History   Occupation: Research officer, political party  Tobacco Use   Smoking status: Never   Smokeless tobacco: Never  Substance and Sexual Activity   Alcohol use: Yes    Comment: 5-6/week   Drug use: No   Sexual  activity: Not on file  Other Topics Concern   Not on file  Social History Narrative   Not on file   Social Determinants of Health   Financial Resource Strain: Not on file  Food Insecurity: Not on file  Transportation Needs: Not on file  Physical Activity: Not on file  Stress: Not on file  Social Connections: Not on file  Intimate Partner Violence: Not on file    Lab Results  Component Value Date   HGBA1C 7.1 (H) 06/09/2022   HGBA1C 7.2 (H) 01/25/2022   HGBA1C 7.4 (H) 01/04/2022   Lab Results  Component Value Date   CHOL 151 06/09/2022   Lab Results  Component Value Date   HDL 58.40 06/09/2022   Lab Results  Component Value Date   LDLCALC 75 06/09/2022   Lab Results  Component Value Date   TRIG 91.0 06/09/2022   Lab Results  Component Value Date   CHOLHDL 3 06/09/2022   Lab Results  Component Value Date   CREATININE 0.80 06/09/2022   Lab Results  Component Value Date   GFR 93.45 06/09/2022   Lab Results  Component Value Date   MICROALBUR <0.7 06/09/2022      Component Value Date/Time   NA 140 06/09/2022 1530   NA 142 11/23/2019 0908   K 4.0 06/09/2022 1530   CL 105 06/09/2022 1530   CO2 25 06/09/2022 1530   GLUCOSE 133 (H) 06/09/2022 1530   BUN 15 06/09/2022 1530   BUN 13 11/23/2019 0908   CREATININE 0.80 06/09/2022 1530   CALCIUM 8.9 06/09/2022 1530   PROT 6.3 06/09/2022 1530   ALBUMIN 3.7 06/09/2022 1530   AST 15 06/09/2022 1530   ALT 16 06/09/2022 1530   ALKPHOS 72 06/09/2022 1530   BILITOT 0.5 06/09/2022 1530   GFRNONAA 100 11/23/2019 0908   GFRAA 116 11/23/2019 0908      Latest Ref Rng & Units 06/09/2022    3:30 PM 01/25/2022    8:51 AM 01/04/2022    2:39 PM  BMP  Glucose 70 - 99 mg/dL 914  782  956   BUN 6 - 23 mg/dL 15  14  13    Creatinine 0.40 - 1.50 mg/dL 2.13  0.86  5.78   Sodium 135 - 145 mEq/L 140  141  140   Potassium 3.5 - 5.1 mEq/L 4.0  4.7  4.0   Chloride 96 - 112 mEq/L 105  104  104   CO2 19 - 32 mEq/L 25  30  27     Calcium 8.4 - 10.5 mg/dL 8.9  9.4  9.1        Component Value Date/Time   WBC 8.0 06/09/2022 1530   RBC 4.72 06/09/2022 1530   HGB 14.9 06/09/2022 1530   HCT 44.1 06/09/2022 1530   PLT 256.0 06/09/2022 1530   MCV 93.3 06/09/2022 1530   MCH 30.8 02/25/2017 1149   MCHC 33.9 06/09/2022 1530   RDW 14.2 06/09/2022 1530   LYMPHSABS 2.2 06/09/2022 1530   MONOABS 0.7 06/09/2022 1530   EOSABS 0.2 06/09/2022 1530   BASOSABS 0.1 06/09/2022 1530     Parts of this note may have been dictated using voice recognition software. There may be variances in spelling and vocabulary which are unintentional. Not all errors are proofread. Please notify the Thereasa Parkin if any discrepancies are noted or if the meaning of any statement is not clear.

## 2022-10-15 NOTE — Patient Instructions (Signed)

## 2022-10-20 ENCOUNTER — Other Ambulatory Visit: Payer: Self-pay

## 2022-10-20 DIAGNOSIS — E108 Type 1 diabetes mellitus with unspecified complications: Secondary | ICD-10-CM

## 2022-10-20 MED ORDER — INSULIN ASPART 100 UNIT/ML IJ SOLN: 2.0000 [IU] | Freq: Three times a day (TID) | INTRAMUSCULAR | 0 refills | Status: DC

## 2022-10-21 ENCOUNTER — Other Ambulatory Visit: Payer: Self-pay

## 2022-11-10 ENCOUNTER — Other Ambulatory Visit: Payer: Self-pay

## 2022-11-10 DIAGNOSIS — Z794 Long term (current) use of insulin: Secondary | ICD-10-CM

## 2022-11-10 MED ORDER — FREESTYLE LIBRE 3 PLUS SENSOR MISC
1.0000 | 3 refills | Status: DC
Start: 2022-11-10 — End: 2023-02-09

## 2022-11-22 ENCOUNTER — Other Ambulatory Visit: Payer: Self-pay | Admitting: *Deleted

## 2022-11-22 DIAGNOSIS — I8390 Asymptomatic varicose veins of unspecified lower extremity: Secondary | ICD-10-CM

## 2022-11-30 ENCOUNTER — Encounter: Payer: Self-pay | Admitting: Vascular Surgery

## 2022-11-30 ENCOUNTER — Ambulatory Visit (INDEPENDENT_AMBULATORY_CARE_PROVIDER_SITE_OTHER): Payer: BC Managed Care – PPO | Admitting: Vascular Surgery

## 2022-11-30 ENCOUNTER — Ambulatory Visit (HOSPITAL_COMMUNITY)
Admission: RE | Admit: 2022-11-30 | Discharge: 2022-11-30 | Disposition: A | Discharge status: Home / Self Care | Payer: BC Managed Care – PPO | Source: Ambulatory Visit | Attending: Vascular Surgery | Admitting: Vascular Surgery

## 2022-11-30 VITALS — BP 127/65 | HR 72 | Temp 98.2°F | Resp 18 | Ht 68.0 in | Wt 214.3 lb

## 2022-11-30 DIAGNOSIS — I8392 Asymptomatic varicose veins of left lower extremity: Secondary | ICD-10-CM | POA: Diagnosis not present

## 2022-11-30 DIAGNOSIS — I8390 Asymptomatic varicose veins of unspecified lower extremity: Secondary | ICD-10-CM | POA: Diagnosis not present

## 2022-11-30 DIAGNOSIS — I872 Venous insufficiency (chronic) (peripheral): Secondary | ICD-10-CM

## 2022-11-30 DIAGNOSIS — I8393 Asymptomatic varicose veins of bilateral lower extremities: Secondary | ICD-10-CM

## 2022-11-30 NOTE — Progress Notes (Signed)
Patient name: Donald Lopez MRN: [id number] DOB: 03/29/1955 Sex: male  REASON FOR CONSULT: Venous insufficiency  HPI: Donald Lopez is a 67 y.o. male, with history of diabetes and prostate cancer that presents for evaluation of venous insufficiency.  Patient previously seen by Dr. Imogene Burn in 2017 and conservative management was pursued due to minimal symptoms.  He states his symptoms really have not progressed.  His wife wanted to get his leg checked out.  States he had some cramping in his big toe.  Denies any heaviness or aching otherwise.  No prior venous interventions.  Not really wearing his compression stockings.    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 s/p PRP OD  s/p PPV with laser and cryo OS 5/10   TOBACCO USE, QUIT 11/17/2006   Trigger finger    VARICOSE VEINS, LOWER EXTREMITIES 11/17/2006    Past Surgical History:  Procedure Laterality Date   CHOLECYSTECTOMY     KNEE ARTHROSCOPY     Left   PROSTATE SURGERY  10/19/2014   [facility] [place]/ Dr. Ivin Booty    SHOULDER SURGERY     Left   VASECTOMY     VITRECTOMY  01/19/2007   Right   VITRECTOMY      Family History  Problem Relation Age of Onset   Cancer Father    Hypertension Mother    Varicose Veins Maternal Aunt    Varicose Veins Maternal Grandmother     SOCIAL HISTORY: Social History   Socioeconomic History   Marital status: Married    Spouse name: Education officer, environmental   Number of children: 3   Years of education: Not on file   Highest  education level: Not on file  Occupational History   Occupation: Research officer, political party  Tobacco Use   Smoking status: Never   Smokeless tobacco: Never  Substance and Sexual Activity   Alcohol use: Yes    Comment: 5-6/week   Drug use: No   Sexual activity: Not on file  Other Topics Concern   Not on file  Social History Narrative   Not on file   Social Determinants of Health   Financial Resource Strain: Not on file  Food Insecurity: Not on file  Transportation Needs: Not on file  Physical Activity: Not on file  Stress: Not on file  Social Connections: Not on file  Intimate Partner Violence: Not on file    Allergies  Allergen Reactions   Penicillins     REACTION: unspecified reaction    Current Outpatient Medications  Medication Sig Dispense Refill   ACCU-CHEK GUIDE test strip CHECK BLOOD SUGAR FIVE TIMES DAILY 450 strip 0   Accu-Chek Softclix Lancets lancets Use to check blood sugar 5 times per day. Dx Code: E10.8 100 each 12   Alcohol Swabs (B-D SINGLE USE SWABS REGULAR) PADS USE AS DIRECTED 100 each 0   amLODipine (NORVASC) 5 MG tablet Take 1 tablet (5 mg total) by mouth daily. 90 tablet 3   aspirin EC 81 MG tablet Take 81 mg by mouth  daily.     atorvastatin (LIPITOR) 80 MG tablet Take 1 tablet (80 mg total) by mouth daily. 90 tablet 3   Blood Glucose Calibration (ACCU-CHEK GUIDE CONTROL) LIQD USE AS DIRECTED WITH GLUCOSE METER 1 each 3   Blood Glucose Monitoring Suppl (ACCU-CHEK GUIDE) w/Device KIT USE AS DIRECTED 1 kit 0   cholecalciferol (VITAMIN D3) 25 MCG (1000 UNIT) tablet Take 1,000 Units by mouth daily.     Continuous Glucose Sensor (FREESTYLE LIBRE 3 PLUS SENSOR) MISC Inject 1 Device into the skin continuous. Change every 15 days 6 each 3   Continuous Glucose Sensor (FREESTYLE LIBRE 3 SENSOR) MISC Change every 14 days 2 each 3   cyanocobalamin (VITAMIN B12) 1000 MCG tablet Take 1,000 mcg by mouth 2 (two) times a week.     Glucagon (GVOKE HYPOPEN 1-PACK) 1 MG/0.2ML  SOAJ Inject 1 mg into the skin as needed (low blood sugar with imapoired consciousness). 0.4 mL 2   glucose blood (ACCU-CHEK AVIVA PLUS) test strip Check blood sugar 3 times a day 100 each 3   insulin aspart (NOVOLOG) 100 UNIT/ML injection Inject 2-5 Units into the skin 3 (three) times daily with meals. 30 mL 0   insulin degludec (TRESIBA FLEXTOUCH) 200 UNIT/ML FlexTouch Pen inject 40 units daily 9 mL 2   Insulin Syringe-Needle U-100 (DROPLET INSULIN SYRINGE) 30G X 1/2" 0.3 ML MISC USE AS DIRECTED FOUR TIMES A DAY 400 each 2   lisinopril (ZESTRIL) 20 MG tablet TAKE ONE TABLET ONCE DAILY 90 tablet 3   sildenafil (VIAGRA) 100 MG tablet Take 1 tablet (100 mg total) by mouth daily as needed for erectile dysfunction. 30 tablet 5   SURE COMFORT PEN NEEDLES 32G X 4 MM MISC Use to inject tresiba daily 100 each 1   VITAMIN D, CHOLECALCIFEROL, PO Take 1 capsule by mouth daily.     No current facility-administered medications for this visit.    REVIEW OF SYSTEMS:  [X]  denotes positive finding, [ ]  denotes negative finding Cardiac  Comments:  Chest pain or chest pressure:    Shortness of breath upon exertion:    Short of breath when lying flat:    Irregular heart rhythm:        Vascular    Pain in calf, thigh, or hip brought on by ambulation:    Pain in feet at night that wakes you up from your sleep:     Blood clot in your veins:    Leg swelling:  x       Pulmonary    Oxygen at home:    Productive cough:     Wheezing:         Neurologic    Sudden weakness in arms or legs:     Sudden numbness in arms or legs:     Sudden onset of difficulty speaking or slurred speech:    Temporary loss of vision in one eye:     Problems with dizziness:         Gastrointestinal    Blood in stool:     Vomited blood:         Genitourinary    Burning when urinating:     Blood in urine:        Psychiatric    Major depression:         Hematologic    Bleeding problems:    Problems with blood clotting  too easily:        Skin    Rashes or  ulcers:        Constitutional    Fever or chills:      PHYSICAL EXAM: Vitals:   11/30/22 1430  BP: 127/65  Pulse: 72  Resp: 18  Temp: 98.2 F (36.8 C)  TempSrc: Temporal  SpO2: 95%  Weight: 214 lb 4.8 oz (97.2 kg)  Height: 5\' 8"  (1.727 m)    GENERAL: The patient is a well-nourished male, in no acute distress. The vital signs are documented above. CARDIAC: There is a regular rate and rhythm.  VASCULAR:  Left DP palpable PULMONARY: No respiratory distress. ABDOMEN: Soft and non-tender.  MUSCULOSKELETAL: There are no major deformities or cyanosis. NEUROLOGIC: No focal weakness or paresthesias are detected. SKIN: Varicosity left ankle with skin thickening as pictured.  PSYCHIATRIC: The patient has a normal affect.    DATA:    Lower Venous Reflux Study   Patient Name:  SAAMIR SELINSKY  Date of Exam:   11/30/2022  Medical Rec #: [id number]           Accession #:    [id number]  Date of Birth: 1955/11/13           Patient Gender: M  Patient Age:   54 years  Exam Location:  [facility]  Procedure:      VAS [place] LOWER EXTREMITY VENOUS REFLUX  Referring Phys: Sherald Hess    ---------------------------------------------------------------------------  -----    Indications: Edema, and varicosities.    Comparison Study: 03/28/2015 Left lower extremity venous reflux   Performing Technologist: Gertie Fey MHA, RDMS, RVT, RDCS     Examination Guidelines: A complete evaluation includes B-mode imaging,  spectral  Doppler, color Doppler, and power Doppler as needed of all accessible  portions  of each vessel. Bilateral testing is considered an integral part of a  complete  examination. Limited examinations for reoccurring indications may be  performed  as noted. The reflux portion of the exam is performed with the patient in  reverse Trendelenburg.  Significant venous reflux is defined as >500 ms in the  superficial venous  system, and >1 second in the deep venous system.     +--------------+---------+------+-----------+------------+--------+  LEFT         Reflux NoRefluxReflux TimeDiameter cmsComments                          Yes                                   +--------------+---------+------+-----------+------------+--------+  CFV                    yes   >1 second                       +--------------+---------+------+-----------+------------+--------+  FV mid        no                                              +--------------+---------+------+-----------+------------+--------+  Popliteal    no                                              +--------------+---------+------+-----------+------------+--------+  GSV at [facility]              yes    >500 ms      1.07              +--------------+---------+------+-----------+------------+--------+  GSV prox thigh          yes    >500 ms      0.82              +--------------+---------+------+-----------+------------+--------+  GSV mid thigh           yes    >500 ms      1.06              +--------------+---------+------+-----------+------------+--------+  GSV dist thigh          yes    >500 ms      0.74              +--------------+---------+------+-----------+------------+--------+  GSV at knee             yes    >500 ms      0.71              +--------------+---------+------+-----------+------------+--------+  GSV prox calf           yes    >500 ms      0.68              +--------------+---------+------+-----------+------------+--------+  SSV Pop Fossa no                            0.24              +--------------+---------+------+-----------+------------+--------+  SSV prox calf no                            0.23              +--------------+---------+------+-----------+------------+--------+         Summary:  Left:  - No evidence of deep  vein thrombosis seen in the left lower extremity,  from the common femoral through the popliteal veins.  - No evidence of superficial venous thrombosis in the left lower  extremity.    *See table(s) above for measurements and observations.   Assessment/Plan:  67 year old male with chronic venous insufficiency consistent with CEAP classification C4 given skin changes.  He has significant reflux in the left great saphenous vein in the calf into the thigh.  I discussed etiology for underlying chronic venous insufficiency.  I discussed basic conservative measures with leg elevation, exercise, compression and weight loss.  We did get him sized for medical grade thigh-high compression stockings today.  I discussed the option of laser ablation of his left great saphenous vein as he has a large refluxing vein here.  Ultimately he feels with no progression of symptoms and rather mild complaints he does not want a pursue any invasive therapy at this time.  I discussed he let me know if that changes.  Follow-up PRN.   Cephus Shelling, MD [facility] Office: [phone]

## 2022-12-23 DIAGNOSIS — Z961 Presence of intraocular lens: Secondary | ICD-10-CM | POA: Insufficient documentation

## 2022-12-23 DIAGNOSIS — H40001 Preglaucoma, unspecified, right eye: Secondary | ICD-10-CM | POA: Insufficient documentation

## 2023-01-06 ENCOUNTER — Ambulatory Visit: Payer: BC Managed Care – PPO | Admitting: Internal Medicine

## 2023-01-06 ENCOUNTER — Encounter: Payer: Self-pay | Admitting: Internal Medicine

## 2023-01-06 VITALS — BP 130/72 | HR 80 | Temp 98.2°F | Ht 68.0 in | Wt 212.0 lb

## 2023-01-06 DIAGNOSIS — E7800 Pure hypercholesterolemia, unspecified: Secondary | ICD-10-CM

## 2023-01-06 DIAGNOSIS — I10 Essential (primary) hypertension: Secondary | ICD-10-CM

## 2023-01-06 DIAGNOSIS — Z23 Encounter for immunization: Secondary | ICD-10-CM | POA: Diagnosis not present

## 2023-01-06 DIAGNOSIS — E538 Deficiency of other specified B group vitamins: Secondary | ICD-10-CM

## 2023-01-06 DIAGNOSIS — Z125 Encounter for screening for malignant neoplasm of prostate: Secondary | ICD-10-CM | POA: Diagnosis not present

## 2023-01-06 DIAGNOSIS — Z0001 Encounter for general adult medical examination with abnormal findings: Secondary | ICD-10-CM

## 2023-01-06 DIAGNOSIS — E108 Type 1 diabetes mellitus with unspecified complications: Secondary | ICD-10-CM | POA: Diagnosis not present

## 2023-01-06 DIAGNOSIS — E559 Vitamin D deficiency, unspecified: Secondary | ICD-10-CM

## 2023-01-06 DIAGNOSIS — Z0000 Encounter for general adult medical examination without abnormal findings: Secondary | ICD-10-CM | POA: Diagnosis not present

## 2023-01-06 LAB — LIPID PANEL
Cholesterol: 141 mg/dL (ref 0–200)
HDL: 62.1 mg/dL (ref >39.00)
LDL Cholesterol: 65 mg/dL (ref 0–99)
NonHDL: 78.59
Total CHOL/HDL Ratio: 2
Triglycerides: 70 mg/dL (ref 0.0–149.0)
VLDL: 14 mg/dL (ref 0.0–40.0)

## 2023-01-06 LAB — CBC WITH DIFFERENTIAL/PLATELET
Basophils Absolute: 0.1 10*3/uL (ref 0.0–0.1)
Basophils Relative: 1.2 % (ref 0.0–3.0)
Eosinophils Absolute: 0.3 10*3/uL (ref 0.0–0.7)
Eosinophils Relative: 2.8 % (ref 0.0–5.0)
HCT: 45.8 % (ref 39.0–52.0)
Hemoglobin: 15.3 g/dL (ref 13.0–17.0)
Lymphocytes Relative: 26.1 % (ref 12.0–46.0)
Lymphs Abs: 2.4 10*3/uL (ref 0.7–4.0)
MCHC: 33.4 g/dL (ref 30.0–36.0)
MCV: 94.4 fL (ref 78.0–100.0)
Monocytes Absolute: 0.7 10*3/uL (ref 0.1–1.0)
Monocytes Relative: 7.9 % (ref 3.0–12.0)
Neutro Abs: 5.8 10*3/uL (ref 1.4–7.7)
Neutrophils Relative %: 62 % (ref 43.0–77.0)
Platelets: 273 10*3/uL (ref 150.0–400.0)
RBC: 4.85 Mil/uL (ref 4.22–5.81)
RDW: 14.2 % (ref 11.5–15.5)
WBC: 9.4 10*3/uL (ref 4.0–10.5)

## 2023-01-06 LAB — BASIC METABOLIC PANEL
BUN: 11 mg/dL (ref 6–23)
CO2: 29 meq/L (ref 19–32)
Calcium: 9.1 mg/dL (ref 8.4–10.5)
Chloride: 105 meq/L (ref 96–112)
Creatinine, Ser: 0.75 mg/dL (ref 0.40–1.50)
GFR: 94.91 mL/min (ref >60.00)
Glucose, Bld: 83 mg/dL (ref 70–99)
Potassium: 3.9 meq/L (ref 3.5–5.1)
Sodium: 142 meq/L (ref 135–145)

## 2023-01-06 LAB — VITAMIN D 25 HYDROXY (VIT D DEFICIENCY, FRACTURES): VITD: 29.26 ng/mL — ABNORMAL LOW (ref 30.00–100.00)

## 2023-01-06 LAB — HEPATIC FUNCTION PANEL
ALT: 21 U/L (ref 0–53)
AST: 20 U/L (ref 0–37)
Albumin: 3.8 g/dL (ref 3.5–5.2)
Alkaline Phosphatase: 84 U/L (ref 39–117)
Bilirubin, Direct: 0.2 mg/dL (ref 0.0–0.3)
Total Bilirubin: 0.6 mg/dL (ref 0.2–1.2)
Total Protein: 6.3 g/dL (ref 6.0–8.3)

## 2023-01-06 LAB — TSH: TSH: 1.08 u[IU]/mL (ref 0.35–5.50)

## 2023-01-06 LAB — PSA: PSA: 0 ng/mL — ABNORMAL LOW (ref 0.10–4.00)

## 2023-01-06 LAB — HEMOGLOBIN A1C: Hgb A1c MFr Bld: 7.3 % — ABNORMAL HIGH (ref 4.6–6.5)

## 2023-01-06 LAB — MICROALBUMIN / CREATININE URINE RATIO
Creatinine,U: 86.9 mg/dL
Microalb Creat Ratio: 0.8 mg/g (ref 0.0–30.0)
Microalb, Ur: <0.7 mg/dL (ref 0.0–1.9)

## 2023-01-06 LAB — VITAMIN B12: Vitamin B-12: 714 pg/mL (ref 211–911)

## 2023-01-06 NOTE — Progress Notes (Signed)
The test results show that your current treatment is OK, as the tests are stable.  Please continue the same plan.  There is no other need for change of treatment or further evaluation based on these results, at this time.  thanks 

## 2023-01-06 NOTE — Patient Instructions (Addendum)
You had the flu shot today  Please continue all other medications as before, and refills have been done if requested.  Please have the pharmacy call with any other refills you may need.  Please continue your efforts at being more active, low cholesterol diet, and weight control.  You are otherwise up to date with prevention measures today.  Please keep your appointments with your specialists as you may have planned - endocrinology, podiatry, and cardiology  Please go to the LAB at the blood drawing area for the tests to be done  You will be contacted by phone if any changes need to be made immediately.  Otherwise, you will receive a letter about your results with an explanation, but please check with MyChart first.  Please make an Appointment to return for your 1 year visit, or sooner if needed

## 2023-01-06 NOTE — Progress Notes (Signed)
Patient ID: Donald Lopez, male   DOB: 1955/09/04, 67 y.o.   MRN: [id number]         Chief Complaint:: wellness exam and dm, hld, low vit d and b12       HPI:  Donald Lopez is a 67 y.o. male here for wellness exam; seen per optho recently. , declines covid booster, for flu shot today, o/w up to date                        Also sees podiatry and cardiology and endo on regular basis.  Pt denies chest pain, increased sob or doe, wheezing, orthopnea, PND, increased LE swelling, palpitations, dizziness or syncope.   Pt denies polydipsia, polyuria, or new focal neuro s/s.    Pt denies fever, wt loss, night sweats, loss of appetite, or other constitutional symptoms  [facility],  Wt Readings from Last 3 Encounters:  01/06/23 212 lb (96.2 kg)  11/30/22 214 lb 4.8 oz (97.2 kg)  06/11/22 212 lb 9.6 oz (96.4 kg)   BP Readings from Last 3 Encounters:  01/06/23 130/72  11/30/22 127/65  06/11/22 122/78   Immunization History  Administered Date(s) Administered   Fluad Trivalent(High Dose 65+) 01/06/2023   Influenza Whole 11/10/2007, 10/10/2009   Influenza, Quadrivalent, Recombinant, Inj, Pf 10/14/2018   Influenza,inj,Quad PF,6+ Mos 10/09/2012, 11/19/2013, 02/25/2017, 12/10/2019, 11/24/2020, 01/04/2022   PFIZER(Purple Top)SARS-COV-2 Vaccination 03/24/2019, 04/14/2019, 09/25/2019   PNEUMOCOCCAL CONJUGATE-20 12/15/2020   Pneumococcal Polysaccharide-23 09/10/2011   Td 06/24/2008   Tdap 11/17/2018   Zoster Recombinant(Shingrix) 12/12/2019, 03/10/2020   There are no preventive care reminders to display for this patient.     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 s/p PRP OD  s/p PPV with laser and cryo OS 5/10   TOBACCO USE, QUIT 11/17/2006   Trigger finger    VARICOSE VEINS, LOWER EXTREMITIES 11/17/2006   Past Surgical History:  Procedure Laterality Date   CHOLECYSTECTOMY     KNEE ARTHROSCOPY     Left   PROSTATE SURGERY  10/19/2014   [facility] [place]/ Dr. Ivin Booty    SHOULDER SURGERY     Left   VASECTOMY     VITRECTOMY  01/19/2007   Right   VITRECTOMY      reports that he has never smoked. He has never used smokeless tobacco. He reports current alcohol use. He reports that he does not use drugs. family history includes Cancer in his father; Hypertension in his mother; Varicose Veins in his maternal aunt and maternal grandmother. Allergies  Allergen Reactions   Penicillins     REACTION: unspecified reaction   Current Outpatient Medications on File Prior to Visit  Medication Sig Dispense Refill   ACCU-CHEK GUIDE test strip CHECK BLOOD SUGAR FIVE TIMES DAILY 450 strip 0   Accu-Chek Softclix Lancets lancets Use to check blood sugar 5 times per day. Dx Code: E10.8 100 each 12   Alcohol Swabs (B-D SINGLE USE SWABS REGULAR) PADS USE AS DIRECTED  100 each 0   amLODipine (NORVASC) 5 MG tablet Take 1 tablet (5 mg total) by mouth daily. 90 tablet 3   aspirin EC 81 MG tablet Take 81 mg by mouth daily.     atorvastatin (LIPITOR) 80 MG tablet Take 1 tablet (80 mg total) by mouth daily. 90 tablet 3   Blood Glucose Calibration (ACCU-CHEK GUIDE CONTROL) LIQD USE AS DIRECTED WITH GLUCOSE METER 1 each 3   Blood Glucose Monitoring Suppl (ACCU-CHEK GUIDE) w/Device KIT USE AS DIRECTED 1 kit 0   cholecalciferol (VITAMIN D3) 25 MCG (1000 UNIT) tablet Take 1,000 Units by mouth daily.     Continuous Glucose Sensor (FREESTYLE LIBRE 3 PLUS SENSOR) MISC Inject 1 Device into the skin continuous. Change every 15  days 6 each 3   Continuous Glucose Sensor (FREESTYLE LIBRE 3 SENSOR) MISC Change every 14 days 2 each 3   cyanocobalamin (VITAMIN B12) 1000 MCG tablet Take 1,000 mcg by mouth 2 (two) times a week.     Glucagon (GVOKE HYPOPEN 1-PACK) 1 MG/0.2ML SOAJ Inject 1 mg into the skin as needed (low blood sugar with imapoired consciousness). 0.4 mL 2   glucose blood (ACCU-CHEK AVIVA PLUS) test strip Check blood sugar 3 times a day 100 each 3   insulin aspart (NOVOLOG) 100 UNIT/ML injection Inject 2-5 Units into the skin 3 (three) times daily with meals. 30 mL 0   insulin degludec (TRESIBA FLEXTOUCH) 200 UNIT/ML FlexTouch Pen inject 40 units daily 9 mL 2   Insulin Syringe-Needle U-100 (DROPLET INSULIN SYRINGE) 30G X 1/2" 0.3 ML MISC USE AS DIRECTED FOUR TIMES A DAY 400 each 2   lisinopril (ZESTRIL) 20 MG tablet TAKE ONE TABLET ONCE DAILY 90 tablet 3   sildenafil (VIAGRA) 100 MG tablet Take 1 tablet (100 mg total) by mouth daily as needed for erectile dysfunction. 30 tablet 5   SURE COMFORT PEN NEEDLES 32G X 4 MM MISC Use to inject tresiba daily 100 each 1   VITAMIN D, CHOLECALCIFEROL, PO Take 1 capsule by mouth daily.     No current facility-administered medications on file prior to visit.        ROS:  All others reviewed and negative.  Objective        PE:  BP 130/72 (BP Location: Right Arm, Patient Position: Sitting, Cuff Size: Normal)   Pulse 80   Temp 98.2 F (36.8 C) (Oral)   Ht 5\' 8"  (1.727 m)   Wt 212 lb (96.2 kg)   SpO2 98%   BMI 32.23 kg/m                 Constitutional: Pt appears in NAD               HENT: Head: NCAT.                Right Ear: External ear normal.                 Left Ear: External ear normal.                Eyes: . Pupils are equal, round, and reactive to light. Conjunctivae and EOM are normal               Nose: without d/c or deformity               Neck: Neck supple. Gross normal ROM               Cardiovascular: Normal rate  and regular rhythm.                  Pulmonary/Chest: Effort normal and breath sounds without rales or wheezing.                Abd:  Soft, NT, ND, + BS, no organomegaly               Neurological: Pt is alert. At baseline orientation, motor grossly intact               Skin: Skin is warm. No rashes, no other new lesions, LE edema - none               Psychiatric: Pt behavior is normal without agitation   Micro: none  Cardiac tracings I have personally interpreted today:  none  Pertinent Radiological findings (summarize): none   Lab Results  Component Value Date   WBC 9.4 01/06/2023   HGB 15.3 01/06/2023   HCT 45.8 01/06/2023   PLT 273.0 01/06/2023   GLUCOSE 83 01/06/2023   CHOL 141 01/06/2023   TRIG 70.0 01/06/2023   HDL 62.10 01/06/2023   LDLDIRECT 123.2 01/22/2013   LDLCALC 65 01/06/2023   ALT 21 01/06/2023   AST 20 01/06/2023   NA 142 01/06/2023   K 3.9 01/06/2023   CL 105 01/06/2023   CREATININE 0.75 01/06/2023   BUN 11 01/06/2023   CO2 29 01/06/2023   TSH 1.08 01/06/2023   PSA 0.00 (L) 01/06/2023   HGBA1C 7.3 (H) 01/06/2023   MICROALBUR <0.7 01/06/2023   Assessment/Plan:  RONOLD GLOMB is a 67 y.o. White or Caucasian [1] male with  has a past medical history of Cancer (HCC), Cancer of prostate with intermediate recurrence risk (stage T2b-c or Gleason 7 or PSA 10-20) (HCC) (07/30/2014), DIABETES MELLITUS, TYPE I (11/17/2006), DIABETIC  RETINOPATHY (11/17/2006), HPV (11/17/2006), HYPERLIPIDEMIA (11/17/2006), Ischemic optic neuropathy, right (01/21/2011), POLYP, GALLBLADDER (11/17/2006), Proliferative diabetic retinopathy(362.02) (08/26/2011), TOBACCO USE, QUIT (11/17/2006), Trigger finger, and VARICOSE VEINS, LOWER EXTREMITIES (11/17/2006).  Encounter for well adult exam with abnormal findings Age and sex appropriate education and counseling updated with regular exercise and diet Referrals for preventative services - none needed Immunizations addressed - declinees covid booster, for flu shot today Smoking  counseling  - none needed Evidence for depression or other mood disorder - none significant Most recent labs reviewed. I have personally reviewed and have noted: 1) the patient's medical and social history 2) The patient's current medications and supplements 3) The patient's height, weight, and BMI have been recorded in the chart   HLD (hyperlipidemia) Lab Results  Component Value Date   LDLCALC 65 01/06/2023   Stable, pt to continue current statin lipitor 80 mg qd   Type 1 diabetes mellitus with complications (HCC) Lab Results  Component Value Date   HGBA1C 7.3 (H) 01/06/2023   Mild uncontrolled, pt to continue current medical treatment novolog SSI, tresibe 40 u every day, and f/u endo, declines other change today   Vitamin D deficiency Last vitamin D Lab Results  Component Value Date   VD25OH 29.26 (L) 01/06/2023   Low to start  oral replacement   B12 deficiency Lab Results  Component Value Date   VITAMINB12 714 01/06/2023   Stable, cont oral replacement - b12 1000 mcg qd   HTN (hypertension) BP Readings from Last 3 Encounters:  01/06/23 130/72  11/30/22 127/65  06/11/22 122/78   Stable, pt to continue medical treatment norvasc 5 every day, lisinopril 20  qd  Followup: Return in about 1 year (around 01/06/2024).  Oliver Barre, MD 01/06/2023 8:41 PM [facility]

## 2023-01-06 NOTE — Assessment & Plan Note (Signed)
Age and sex appropriate education and counseling updated with regular exercise and diet Referrals for preventative services - none needed Immunizations addressed - declinees covid booster, for flu shot today Smoking counseling  - none needed Evidence for depression or other mood disorder - none significant Most recent labs reviewed. I have personally reviewed and have noted: 1) the patient's medical and social history 2) The patient's current medications and supplements 3) The patient's height, weight, and BMI have been recorded in the chart

## 2023-01-06 NOTE — Assessment & Plan Note (Signed)
Lab Results  Component Value Date   VITAMINB12 714 01/06/2023   Stable, cont oral replacement - b12 1000 mcg qd

## 2023-01-06 NOTE — Assessment & Plan Note (Signed)
Lab Results  Component Value Date   LDLCALC 65 01/06/2023   Stable, pt to continue current statin lipitor 80 mg qd

## 2023-01-06 NOTE — Assessment & Plan Note (Signed)
Lab Results  Component Value Date   HGBA1C 7.3 (H) 01/06/2023   Mild uncontrolled, pt to continue current medical treatment novolog SSI, tresibe 40 u every day, and f/u endo, declines other change today

## 2023-01-06 NOTE — Assessment & Plan Note (Signed)
Last vitamin D Lab Results  Component Value Date   VD25OH 29.26 (L) 01/06/2023   Low to start  oral replacement

## 2023-01-06 NOTE — Assessment & Plan Note (Signed)
BP Readings from Last 3 Encounters:  01/06/23 130/72  11/30/22 127/65  06/11/22 122/78   Stable, pt to continue medical treatment norvasc 5 every day, lisinopril 20 qd

## 2023-01-10 ENCOUNTER — Other Ambulatory Visit: Payer: Self-pay

## 2023-01-10 DIAGNOSIS — E1065 Type 1 diabetes mellitus with hyperglycemia: Secondary | ICD-10-CM

## 2023-01-10 MED ORDER — TRESIBA FLEXTOUCH 200 UNIT/ML ~~LOC~~ SOPN: PEN_INJECTOR | SUBCUTANEOUS | 2 refills | Status: DC

## 2023-02-07 ENCOUNTER — Encounter: Payer: Self-pay | Admitting: "Endocrinology

## 2023-02-08 ENCOUNTER — Other Ambulatory Visit: Payer: Self-pay

## 2023-02-08 DIAGNOSIS — E108 Type 1 diabetes mellitus with unspecified complications: Secondary | ICD-10-CM

## 2023-02-08 DIAGNOSIS — E1065 Type 1 diabetes mellitus with hyperglycemia: Secondary | ICD-10-CM

## 2023-02-08 MED ORDER — TRESIBA FLEXTOUCH 200 UNIT/ML ~~LOC~~ SOPN: PEN_INJECTOR | SUBCUTANEOUS | 2 refills | Status: DC

## 2023-02-08 MED ORDER — INSULIN ASPART 100 UNIT/ML IJ SOLN
2.0000 [IU] | Freq: Three times a day (TID) | INTRAMUSCULAR | 0 refills | Status: DC
Start: 2023-02-08 — End: 2023-05-25

## 2023-02-09 ENCOUNTER — Other Ambulatory Visit: Payer: Self-pay | Admitting: Endocrinology

## 2023-02-09 ENCOUNTER — Other Ambulatory Visit: Payer: Self-pay

## 2023-02-09 DIAGNOSIS — Z794 Long term (current) use of insulin: Secondary | ICD-10-CM

## 2023-02-09 MED ORDER — FREESTYLE LIBRE 3 PLUS SENSOR MISC: 1.0000 | 3 refills | Status: DC

## 2023-02-10 ENCOUNTER — Other Ambulatory Visit (HOSPITAL_COMMUNITY): Payer: Self-pay

## 2023-02-10 ENCOUNTER — Telehealth: Payer: Self-pay | Admitting: Dietician

## 2023-02-10 ENCOUNTER — Telehealth: Payer: Self-pay

## 2023-02-10 NOTE — Telephone Encounter (Signed)
PA request sent.

## 2023-02-10 NOTE — Telephone Encounter (Signed)
Pharmacy Patient Advocate Encounter   Received notification from Pt Calls Messages that prior authorization for Freestyle libre 3 plus is required/requested.   Insurance verification completed.   The patient is insured through [employer] .   Per test claim: Medication may be covered under Part B. Not covered under Part D Law.  Prefers Accu-chek or True Metrix.   Needs to be billed to Part B or sent to DME supplier. It may need a letter of medical necessity or switched to one of the preferred alternatives.

## 2023-02-10 NOTE — Telephone Encounter (Signed)
Patient called and stated that he has changed to Medicare this year and they are requiring a prior authorization for the [facility] 3 Plus.  He is inquiring about the status. Message sent to [facility] Clinical pool.  Oran Rein, RD, LDN, CDCES, DipACLM

## 2023-02-14 ENCOUNTER — Other Ambulatory Visit: Payer: Self-pay

## 2023-02-14 DIAGNOSIS — E1065 Type 1 diabetes mellitus with hyperglycemia: Secondary | ICD-10-CM

## 2023-02-14 MED ORDER — FREESTYLE LIBRE 3 SENSOR MISC: 3 refills | Status: AC

## 2023-02-18 ENCOUNTER — Telehealth: Payer: Self-pay | Admitting: Dietician

## 2023-02-18 NOTE — Telephone Encounter (Signed)
[employer] Authorization needed for patient to receive the FreeStyle Libre 3 plus. Provided sample for patient at front for him to pick up. Lot [id number], Expiration 05/18/2023.  Oran Rein, RD, LDN, CDCES, DipACLM

## 2023-02-21 NOTE — Telephone Encounter (Signed)
Patient picked up sample of Free [employer] 3 Plus

## 2023-02-22 ENCOUNTER — Telehealth: Payer: Self-pay | Admitting: Pharmacy Technician

## 2023-02-22 ENCOUNTER — Other Ambulatory Visit (HOSPITAL_COMMUNITY): Payer: Self-pay

## 2023-02-22 DIAGNOSIS — E1165 Type 2 diabetes mellitus with hyperglycemia: Secondary | ICD-10-CM

## 2023-02-22 MED ORDER — FREESTYLE LIBRE 3 PLUS SENSOR MISC: 1.0000 | 3 refills | Status: AC

## 2023-02-22 NOTE — Telephone Encounter (Signed)
 Pharmacy Patient Advocate Encounter   Received notification from Pt Calls Messages that prior authorization for [facility] 3 plus sensor is required/requested.   Insurance verification completed.   The patient is insured through [employer].   The above mentioned medication is not covered by the patient's Medicare Part D plan.  Called the pt's pharmacy to have them bill part B. Copay was $171+ change.  It may or may not be cheaper through a Medical Supply Company  Some companies are:        [facility] [phone]         [facility] [phone]        [facility] [phone]

## 2023-02-22 NOTE — Telephone Encounter (Signed)
PA request has been  received . New Encounter created for follow up. For additional info see Pharmacy Prior Auth telephone encounter from 02/22/23.

## 2023-02-22 NOTE — Addendum Note (Signed)
Addended by: Lisabeth Pick on: 02/22/2023 02:35 PM   Modules accepted: Orders

## 2023-03-22 ENCOUNTER — Other Ambulatory Visit: Payer: Self-pay

## 2023-03-22 ENCOUNTER — Other Ambulatory Visit (HOSPITAL_COMMUNITY): Payer: Self-pay

## 2023-03-22 DIAGNOSIS — E1065 Type 1 diabetes mellitus with hyperglycemia: Secondary | ICD-10-CM

## 2023-03-22 MED ORDER — TRESIBA FLEXTOUCH 200 UNIT/ML ~~LOC~~ SOPN: PEN_INJECTOR | SUBCUTANEOUS | 2 refills | Status: DC

## 2023-03-22 NOTE — Progress Notes (Signed)
 Requested Prescriptions   Signed Prescriptions Disp Refills   insulin degludec (TRESIBA FLEXTOUCH) 200 UNIT/ML FlexTouch Pen 9 mL 2    Sig: inject 40 units daily

## 2023-03-24 ENCOUNTER — Other Ambulatory Visit: Payer: Self-pay

## 2023-03-24 DIAGNOSIS — E1065 Type 1 diabetes mellitus with hyperglycemia: Secondary | ICD-10-CM

## 2023-03-24 MED ORDER — SURE COMFORT PEN NEEDLES 32G X 4 MM MISC: 1.0000 | Freq: Every day | 1 refills | Status: DC

## 2023-03-24 NOTE — Progress Notes (Signed)
   Requested Prescriptions   Signed Prescriptions Disp Refills   Insulin Pen Needle (SURE COMFORT PEN NEEDLES) 32G X 4 MM MISC 100 each 1    Sig: Inject 1 Needle into the skin daily.

## 2023-05-25 ENCOUNTER — Other Ambulatory Visit: Payer: Self-pay

## 2023-05-25 DIAGNOSIS — E108 Type 1 diabetes mellitus with unspecified complications: Secondary | ICD-10-CM

## 2023-05-25 MED ORDER — INSULIN ASPART 100 UNIT/ML IJ SOLN: 2.0000 [IU] | Freq: Three times a day (TID) | INTRAMUSCULAR | 0 refills | Status: DC

## 2023-05-25 NOTE — Telephone Encounter (Signed)
 Requested Prescriptions   Signed Prescriptions Disp Refills   insulin  aspart (NOVOLOG ) 100 UNIT/ML injection 30 mL 0    Sig: Inject 2-5 Units into the skin 3 (three) times daily with meals.    Authorizing Provider: Jorge Newcomer    Ordering User: Waneta Gut

## 2023-05-27 ENCOUNTER — Other Ambulatory Visit (HOSPITAL_BASED_OUTPATIENT_CLINIC_OR_DEPARTMENT_OTHER): Payer: Self-pay | Admitting: Cardiology

## 2023-06-02 ENCOUNTER — Ambulatory Visit (INDEPENDENT_AMBULATORY_CARE_PROVIDER_SITE_OTHER): Admitting: "Endocrinology

## 2023-06-02 ENCOUNTER — Encounter: Payer: Self-pay | Admitting: "Endocrinology

## 2023-06-02 VITALS — BP 122/70 | HR 54 | Ht 68.0 in | Wt 217.0 lb

## 2023-06-02 DIAGNOSIS — E7800 Pure hypercholesterolemia, unspecified: Secondary | ICD-10-CM

## 2023-06-02 DIAGNOSIS — E1165 Type 2 diabetes mellitus with hyperglycemia: Secondary | ICD-10-CM | POA: Diagnosis not present

## 2023-06-02 DIAGNOSIS — Z794 Long term (current) use of insulin: Secondary | ICD-10-CM

## 2023-06-02 LAB — POCT GLYCOSYLATED HEMOGLOBIN (HGB A1C): Hemoglobin A1C: 6.9 % — AB (ref 4.0–5.6)

## 2023-06-02 NOTE — Patient Instructions (Signed)

## 2023-06-02 NOTE — Progress Notes (Signed)
 Outpatient Endocrinology Note Donald Newcomer, MD  06/02/23   Donald Lopez 09/13/1955 [id number]  Referring Provider: Roslyn Coombe, MD Primary Care Provider: Roslyn Coombe, MD Reason for consultation: Subjective   Assessment & Plan  Diagnoses and all orders for this visit:  Uncontrolled type 2 diabetes mellitus with hyperglycemia (HCC) -     POCT glycosylated hemoglobin (Hb A1C)  Long-term insulin  use (HCC)  Pure hypercholesterolemia   Diabetes complicated by retinopathy Hba1c goal less than 7.0, current Hba1c is 6.9%. Will recommend: Tresiba  42-45 units qam NovoLog  vial 2-5 for break fast, 2-7 units and lunch and dinner 15 min before meals   Eat snack before taking dog for walk in morning to prevent occasional low Has alarm set up for lows at 80  No known contraindications to any of above medications Glucagon  described and sent on 06/11/22  Hyperlipidemia -Last LDL near goal: 65 -on atorvastatin  80 mg QD -Follow low fat diet and exercise   -Blood pressure goal <140/90 - Microalbumin/creatinine at goal < 30 - on ACE/ARB lisinopril  20 mg qd  -diet changes including salt restriction -limit eating outside -counseled BP targets per standards of diabetes care -Uncontrolled blood pressure can lead to retinopathy, nephropathy and cardiovascular and atherosclerotic heart disease  Reviewed and counseled on: -A1C target -Blood sugar targets -Complications of uncontrolled diabetes  -Checking blood sugar before meals and bedtime and bring log next visit -All medications with mechanism of action and side effects -Hypoglycemia management: rule of 15's, Glucagon  Emergency Kit and medical alert ID -low-carb low-fat plate-method diet -At least 20 minutes of physical activity per day -Annual dilated retinal eye exam and foot exam -compliance and follow up needs -follow up as scheduled or earlier if problem gets worse  Call if blood sugar is less than 70 or  consistently above 250    Take a 15 gm snack of carbohydrate at bedtime before you go to sleep if your blood sugar is less than 100.    If you are going to fast after midnight for a test or procedure, ask your physician for instructions on how to reduce/decrease your insulin  dose.    Call if blood sugar is less than 70 or consistently above 250  -Treating a low sugar by rule of 15  (15 gms of sugar every 15 min until sugar is more than 70) If you feel your sugar is low, test your sugar to be sure If your sugar is low (less than 70), then take 15 grams of a fast acting Carbohydrate (3-4 glucose tablets or glucose gel or 4 ounces of juice or regular soda) Recheck your sugar 15 min after treating low to make sure it is more than 70 If sugar is still less than 70, treat again with 15 grams of carbohydrate          Don't drive the hour of hypoglycemia  If unconscious/unable to eat or drink by mouth, use glucagon  injection or nasal spray baqsimi and call 911. Can repeat again in 15 min if still unconscious.  Return in about 4 months (around 10/03/2023).   I have reviewed current medications, nurse's notes, allergies, vital signs, past medical and surgical history, family medical history, and social history for this encounter. Counseled patient on symptoms, examination findings, lab findings, imaging results, treatment decisions and monitoring and prognosis. The patient understood the recommendations and agrees with the treatment plan. All questions regarding treatment plan were fully answered.  Donald Newcomer, MD  06/02/23  History of Present Illness Donald Lopez is a 68 y.o. year old male who presents for follow up of Type 1 diabetes mellitus.  Donald Lopez was first diagnosed in 1985.   Diabetes education +  Home diabetes regimen: Tresiba  40-42 units qam NovoLog  vial 2-5 for break fast, 2-7 units and lunch and dinner   Prior history: He has been on multiple injections  since diagnosis A1c in the last few years has ranged between 7.9 and 8.7  Recent history:   His A1c is significantly better at 7.2 compared to 8.5 on his initial visit and previously best was 7.9    He has been on Tresiba  instead of Lantus  since his last visit  COMPLICATIONS -  MI/Stroke + retinopathy, last eye exam 2024 -  neuropathy -  nephropathy  BLOOD SUGAR DATA  CGM interpretation: At today's visit, we reviewed her CGM downloads. The full report is scanned in the media. Reviewing the CGM trends, BG are mildly elevated overnight.  Physical Exam  BP 122/70   Pulse (!) 54   Ht 5\' 8"  (1.727 m)   Wt 217 lb (98.4 kg)   SpO2 98%   BMI 32.99 kg/m    Constitutional: well developed, well nourished Head: normocephalic, atraumatic Eyes: sclera anicteric, no redness Neck: supple Lungs: normal respiratory effort Neurology: alert and oriented Skin: dry, no appreciable rashes Musculoskeletal: no appreciable defects Psychiatric: normal mood and affect Diabetic Foot Exam - Simple   No data filed      Current Medications Patient's Medications  New Prescriptions   No medications on file  Previous Medications   ACCU-CHEK GUIDE TEST STRIP    CHECK BLOOD SUGAR FIVE TIMES DAILY   ACCU-CHEK SOFTCLIX LANCETS LANCETS    Use to check blood sugar 5 times per day. Dx Code: E10.8   ALCOHOL SWABS (B-D SINGLE USE SWABS REGULAR) PADS    USE AS DIRECTED   AMLODIPINE  (NORVASC ) 5 MG TABLET    Take 1 tablet (5 mg total) by mouth daily.   ASPIRIN EC 81 MG TABLET    Take 81 mg by mouth daily.   ATORVASTATIN  (LIPITOR) 80 MG TABLET    Take 1 tablet (80 mg total) by mouth daily.   BLOOD GLUCOSE CALIBRATION (ACCU-CHEK GUIDE CONTROL) LIQD    USE AS DIRECTED WITH GLUCOSE METER   BLOOD GLUCOSE MONITORING SUPPL (ACCU-CHEK GUIDE) W/DEVICE KIT    USE AS DIRECTED   CHOLECALCIFEROL (VITAMIN D3) 25 MCG (1000 UNIT) TABLET    Take 1,000 Units by mouth daily.   CONTINUOUS GLUCOSE SENSOR (FREESTYLE LIBRE 3 PLUS  SENSOR) MISC    Inject 1 Device into the skin continuous. Change every 15 days   CONTINUOUS GLUCOSE SENSOR (FREESTYLE LIBRE 3 SENSOR) MISC    Change every 14 days   CYANOCOBALAMIN  (VITAMIN B12) 1000 MCG TABLET    Take 1,000 mcg by mouth 2 (two) times a week.   GLUCAGON  (GVOKE HYPOPEN  1-PACK) 1 MG/0.2ML SOAJ    Inject 1 mg into the skin as needed (low blood sugar with imapoired consciousness).   GLUCOSE BLOOD (ACCU-CHEK AVIVA PLUS) TEST STRIP    Check blood sugar 3 times a day   INSULIN  ASPART (NOVOLOG ) 100 UNIT/ML INJECTION    Inject 2-5 Units into the skin 3 (three) times daily with meals.   INSULIN  DEGLUDEC (TRESIBA  FLEXTOUCH) 200 UNIT/ML FLEXTOUCH PEN    inject 40 units daily   INSULIN  PEN NEEDLE (SURE COMFORT PEN NEEDLES) 32G X 4 MM MISC  Inject 1 Needle into the skin daily.   INSULIN  SYRINGE-NEEDLE U-100 (DROPLET INSULIN  SYRINGE) 30G X 1/2" 0.3 ML MISC    USE AS DIRECTED FOUR TIMES A DAY   LISINOPRIL  (ZESTRIL ) 20 MG TABLET    TAKE ONE TABLET ONCE DAILY   SILDENAFIL  (VIAGRA ) 100 MG TABLET    Take 1 tablet (100 mg total) by mouth daily as needed for erectile dysfunction.   VITAMIN D , CHOLECALCIFEROL, PO    Take 1 capsule by mouth daily.  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 s/p PRP OD  s/p PPV with laser and cryo  OS 5/10   TOBACCO USE, QUIT 11/17/2006   Trigger finger    VARICOSE VEINS, LOWER EXTREMITIES 11/17/2006    Past Surgical History Past Surgical History:  Procedure Laterality Date   CHOLECYSTECTOMY     KNEE ARTHROSCOPY     Left   PROSTATE SURGERY  10/19/2014   [facility] [place]/ Dr. Clabe Crete    SHOULDER SURGERY     Left   VASECTOMY     VITRECTOMY  01/19/2007   Right   VITRECTOMY      Family History family history includes Cancer in his father; Hypertension in his mother; Varicose Veins in his maternal aunt and maternal grandmother.  Social History Social History   Socioeconomic History   Marital status: Married    Spouse name: Education officer, environmental   Number of children: 3   Years of education: Not on file   Highest education level: Not on file  Occupational History   Occupation: Research officer, political party  Tobacco Use   Smoking status: Never   Smokeless tobacco: Never  Substance and Sexual Activity   Alcohol use: Yes    Comment: 5-6/week   Drug use: No   Sexual activity: Not on file  Other Topics Concern   Not on file  Social History Narrative   Not on file   Social Drivers of Health   Financial Resource Strain: Not on file  Food Insecurity: Not on file  Transportation Needs: Not on file  Physical Activity: Not on file  Stress: Not on file  Social Connections: Not on file  Intimate Partner Violence: Not on file    Lab Results  Component Value Date   HGBA1C 6.9 (A) 06/02/2023   HGBA1C 7.3 (H) 01/06/2023   HGBA1C 7.1 (H) 06/09/2022   Lab Results  Component Value Date   CHOL 141 01/06/2023   Lab Results  Component Value Date   HDL 62.10 01/06/2023   Lab Results  Component Value Date   LDLCALC 65 01/06/2023   Lab Results  Component Value Date   TRIG 70.0 01/06/2023   Lab Results  Component Value Date   CHOLHDL 2 01/06/2023   Lab Results  Component Value Date   CREATININE 0.75 01/06/2023  Lab Results  Component Value Date   GFR 94.91  01/06/2023   Lab Results  Component Value Date   MICROALBUR <0.7 01/06/2023      Component Value Date/Time   NA 142 01/06/2023 1455   NA 142 11/23/2019 0908   K 3.9 01/06/2023 1455   CL 105 01/06/2023 1455   CO2 29 01/06/2023 1455   GLUCOSE 83 01/06/2023 1455   BUN 11 01/06/2023 1455   BUN 13 11/23/2019 0908   CREATININE 0.75 01/06/2023 1455   CALCIUM  9.1 01/06/2023 1455   PROT 6.3 01/06/2023 1455   ALBUMIN 3.8 01/06/2023 1455   AST 20 01/06/2023 1455   ALT 21 01/06/2023 1455   ALKPHOS 84 01/06/2023 1455   BILITOT 0.6 01/06/2023 1455   GFRNONAA 100 11/23/2019 0908   GFRAA 116 11/23/2019 0908      Latest Ref Rng & Units 01/06/2023    2:55 PM 06/09/2022    3:30 PM 01/25/2022    8:51 AM  BMP  Glucose 70 - 99 mg/dL 83  440  102   BUN 6 - 23 mg/dL 11  15  14    Creatinine 0.40 - 1.50 mg/dL 7.25  3.66  4.40   Sodium 135 - 145 mEq/L 142  140  141   Potassium 3.5 - 5.1 mEq/L 3.9  4.0  4.7   Chloride 96 - 112 mEq/L 105  105  104   CO2 19 - 32 mEq/L 29  25  30    Calcium  8.4 - 10.5 mg/dL 9.1  8.9  9.4        Component Value Date/Time   WBC 9.4 01/06/2023 1455   RBC 4.85 01/06/2023 1455   HGB 15.3 01/06/2023 1455   HCT 45.8 01/06/2023 1455   PLT 273.0 01/06/2023 1455   MCV 94.4 01/06/2023 1455   MCH 30.8 02/25/2017 1149   MCHC 33.4 01/06/2023 1455   RDW 14.2 01/06/2023 1455   LYMPHSABS 2.4 01/06/2023 1455   MONOABS 0.7 01/06/2023 1455   EOSABS 0.3 01/06/2023 1455   BASOSABS 0.1 01/06/2023 1455     Parts of this note may have been dictated using voice recognition software. There may be variances in spelling and vocabulary which are unintentional. Not all errors are proofread. Please notify the [facility] if any discrepancies are noted or if the meaning of any statement is not clear.

## 2023-06-06 ENCOUNTER — Encounter: Payer: Self-pay | Admitting: "Endocrinology

## 2023-06-19 NOTE — Progress Notes (Unsigned)
 Cardiology Office Note:    Date:  06/20/2023   ID:  [name], DOB August 21, 1955, MRN [id number]  PCP:  Donald Coombe, MD  Cardiologist:  Donald Hamburg, MD  Electrophysiologist:  None   Referring MD: Donald Coombe, MD   Chief Complaint  Patient presents with   Coronary Artery Disease    History of Present Illness:    Donald Lopez is a 68 y.o. male with a hx of type 1 diabetes, prostate cancer, hyperlipidemia, tobacco use who presents for follow-up.  He was referred by Dr. Autry Legions for an evaluation of elevated calcium  score, initially seen on 03/05/2019.  Patient underwent a calcium  score on 02/07/2019, which was 1397 (97th percentile for age/gender).  Given the significant elevation in calcium  score, he underwent a Lexiscan  Myoview  on 02/28/2019, which showed no evidence of ischemia, EF 61%.  At initial clinic visit, he denied any chest pain or dyspnea.  Reports that he exercises 3-4 times per week, walk 2 to 3 miles carrying 2 pound weights and also does the bicycle or elliptical at the [facility] for 30 minutes.  Denies any exertional symptoms.  Started statin in October 2020.  Never smoked.  Father had MI in 59s.  Mother had CVA in 34s.  Sister was found to have severe AS due to BAV at age 16.   Since last clinic visit, he reports he is doing well. Denies any chest pain, dyspnea, lightheadedness, syncope, or palpitations.  Does report some swelling in left leg.  He walks dog and goes to gym 2-3 times per week for about an hour.  Denies any exertional symptoms.  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 s/p PRP OD  s/p PPV with laser and cryo OS 5/10   TOBACCO USE, QUIT 11/17/2006   Trigger finger    VARICOSE VEINS, LOWER EXTREMITIES 11/17/2006    Past Surgical History:  Procedure Laterality Date   CHOLECYSTECTOMY     KNEE ARTHROSCOPY     Left   PROSTATE SURGERY  10/19/2014   [place]/ Dr. Clabe Crete    SHOULDER SURGERY     Left   VASECTOMY     VITRECTOMY  01/19/2007   Right   VITRECTOMY      Current Medications: Current Meds  Medication Sig   ACCU-CHEK GUIDE test strip CHECK BLOOD SUGAR FIVE TIMES DAILY   Accu-Chek Softclix Lancets lancets Use to check blood sugar 5 times per day. Dx Code: E10.8   Alcohol Swabs (B-D SINGLE USE SWABS REGULAR) PADS USE AS DIRECTED   amLODipine  (NORVASC ) 5 MG tablet Take 1 tablet (5 mg total) by mouth daily.   aspirin EC 81 MG tablet Take 81 mg by mouth daily.   atorvastatin  (LIPITOR) 80 MG tablet Take 1 tablet (80 mg total) by mouth daily.   Blood Glucose Calibration (ACCU-CHEK GUIDE CONTROL) LIQD USE AS DIRECTED WITH GLUCOSE METER   Blood Glucose Monitoring Suppl (ACCU-CHEK GUIDE) w/Device KIT USE AS DIRECTED   cholecalciferol (VITAMIN D3) 25 MCG (1000 UNIT) tablet  Take 1,000 Units by mouth daily.   Continuous Glucose Sensor (FREESTYLE LIBRE 3 PLUS SENSOR) MISC Inject 1 Device into the skin continuous. Change every 15 days   Continuous Glucose Sensor (FREESTYLE LIBRE 3 SENSOR) MISC Change every 14 days   cyanocobalamin  (VITAMIN B12) 1000 MCG tablet Take 1,000 mcg by mouth 2 (two) times a week.   Glucagon  (GVOKE HYPOPEN  1-PACK) 1 MG/0.2ML SOAJ Inject 1 mg into the skin as needed (low blood sugar with imapoired consciousness).   glucose blood (ACCU-CHEK AVIVA PLUS) test strip Check blood sugar 3 times a day   insulin  aspart (NOVOLOG ) 100 UNIT/ML injection Inject 2-5 Units into the skin 3 (three) times daily with meals. (Patient taking  differently: Inject 2-7 Units into the skin 3 (three) times daily with meals.)   insulin  degludec (TRESIBA  FLEXTOUCH) 200 UNIT/ML FlexTouch Pen inject 40 units daily   Insulin  Pen Needle (SURE COMFORT PEN NEEDLES) 32G X 4 MM MISC Inject 1 Needle into the skin daily.   Insulin  Syringe-Needle U-100 (DROPLET INSULIN  SYRINGE) 30G X 1/2" 0.3 ML MISC USE AS DIRECTED FOUR TIMES A DAY   lisinopril  (ZESTRIL ) 20 MG tablet TAKE ONE TABLET ONCE DAILY   sildenafil  (VIAGRA ) 100 MG tablet Take 1 tablet (100 mg total) by mouth daily as needed for erectile dysfunction.   VITAMIN D , CHOLECALCIFEROL, PO Take 1 capsule by mouth daily.     Allergies:   Penicillins   Social History   Socioeconomic History   Marital status: Married    Spouse name: Education officer, environmental   Number of children: 3   Years of education: Not on file   Highest education level: Not on file  Occupational History   Occupation: Research officer, political party  Tobacco Use   Smoking status: Never   Smokeless tobacco: Never  Substance and Sexual Activity   Alcohol use: Yes    Comment: 5-6/week   Drug use: No   Sexual activity: Not on file  Other Topics Concern   Not on file  Social History Narrative   Not on file   Social Drivers of Health   Financial Resource Strain: Not on file  Food Insecurity: Not on file  Transportation Needs: Not on file  Physical Activity: Not on file  Stress: Not on file  Social Connections: Not on file     Family History: The patient's family history includes Cancer in his father; Hypertension in his mother; Varicose Veins in his maternal aunt and maternal grandmother.  ROS:   Please see the history of present illness.     All other systems reviewed and are negative.  EKGs/Labs/Other Studies Reviewed:    The following studies were reviewed today:   EKG:   04/15/2022: Normal sinus rhythm, rate 74, no ST abnormalities 06/20/2023: Normal sinus rhythm, rate 70, no ST abnormalities  Recent Labs: 01/06/2023:  ALT 21; BUN 11; Creatinine, Ser 0.75; Hemoglobin 15.3; Platelets 273.0; Potassium 3.9; Sodium 142; TSH 1.08  Recent Lipid Panel    Component Value Date/Time   CHOL 141 01/06/2023 1455   CHOL 152 11/09/2019 1039   TRIG 70.0 01/06/2023 1455   HDL 62.10 01/06/2023 1455   HDL 60 11/09/2019 1039   CHOLHDL 2 01/06/2023 1455   VLDL 14.0 01/06/2023 1455   LDLCALC 65 01/06/2023 1455   LDLCALC 80 11/09/2019 1039   LDLDIRECT 123.2 01/22/2013 1101    Calcium  score 02/07/19: Coronary calcium  score of 1397. This was 97th percentile for age and sex matched control. Recommend aggressive risk factor modification. Consider  noninvasive ischemic testing.  Lexiscan  Myoview  02/28/2019: The left ventricular ejection fraction is normal (55-65%). Nuclear stress EF: 61%. There was no ST segment deviation noted during stress. The study is normal. No evidence of ischemia. This is a low risk study. No prior study for comparison.  Physical Exam:    VS:  BP 130/60   Pulse 74   Ht 5\' 8"  (1.727 m)   Wt 216 lb 12.8 oz (98.3 kg)   SpO2 94%   BMI 32.96 kg/m     Wt Readings from Last 3 Encounters:  06/20/23 216 lb 12.8 oz (98.3 kg)  06/02/23 217 lb (98.4 kg)  01/06/23 212 lb (96.2 kg)     GEN:  Well nourished, well developed in no acute distress HEENT: Normal NECK: No JVD CARDIAC: RRR, no murmurs, rubs, gallops RESPIRATORY:  Clear to auscultation without rales, wheezing or rhonchi  ABDOMEN: Soft, non-tender, non-distended MUSCULOSKELETAL:  No edema; No deformity  SKIN: Warm and dry NEUROLOGIC:  Alert and oriented x 3 PSYCHIATRIC:  Normal affect   ASSESSMENT:    1. Coronary artery disease involving native coronary artery of native heart without angina pectoris   2. Family history of bicuspid aortic valve   3. Essential hypertension   4. Hyperlipidemia, unspecified hyperlipidemia type     PLAN:    Coronary artery disease: calcium  score on 02/07/2019 was 1397 (97th percentile for age/gender).   Given the significant elevation in calcium  score, he underwent a Lexiscan  Myoview  on 02/28/2019, which showed no evidence of ischemia, EF 61%.  He denies any anginal symptoms -Continue atorvastatin  80 mg daily -Continue aspirin 81 mg daily  Family history of bicuspid aortic valve: Reports his sister was found to have severe aortic stenosis due to bicuspid aortic valve in her 46s.  Screening echocardiogram was done on 07/28/22, which showed tricuspid aortic valve, normal biventricular function, no significant valve disease.  Hypertension: On lisinopril  20 mg daily and amlodipine  5 mg daily.  Appears controlled  Hyperlipidemia: Continue atorvastatin  80 mg daily.  LDL 65 on 01/06/2023  Type 1 diabetes: On insulin , follows with endocrinology.  A1c 6.9% on 05/2023  RTC in 1 year   Medication Adjustments/Labs and Tests Ordered: Current medicines are reviewed at length with the patient today.  Concerns regarding medicines are outlined above.  Orders Placed This Encounter  Procedures   EKG 12-Lead   No orders of the defined types were placed in this encounter.   Patient Instructions  Medication Instructions:  Continue current medications *If you need a refill on your cardiac medications before your next appointment, please call your pharmacy*  Lab Work: none If you have labs (blood work) drawn today and your tests are completely normal, you will receive your results only by: MyChart Message (if you have MyChart) OR A paper copy in the mail If you have any lab test that is abnormal or we need to change your treatment, we will call you to review the results.  Testing/Procedures: none  Follow-Up: At [facility], you and your health needs are our priority.  As part of our continuing mission to provide you with exceptional heart care, our providers are all part of one team.  This team includes your primary Cardiologist (physician) and Advanced Practice Providers or APPs (Physician  Assistants and Nurse Practitioners) who all work together to provide you with the care you need, when you need it.  Your next appointment:   1 year(s)  Provider:   Wendie Hamburg, MD  We recommend signing up for the patient portal called "MyChart".  Sign up information is provided on this After Visit Summary.  MyChart is used to connect with patients for Virtual Visits (Telemedicine).  Patients are able to view lab/test results, encounter notes, upcoming appointments, etc.  Non-urgent messages can be sent to your provider as well.   To learn more about what you can do with MyChart, go to [email or web address].   Other Instructions none       Signed, Donald Hamburg, MD  06/20/2023 9:40 AM    [facility]

## 2023-06-20 ENCOUNTER — Ambulatory Visit: Payer: Self-pay | Attending: Cardiology | Admitting: Cardiology

## 2023-06-20 ENCOUNTER — Encounter: Payer: Self-pay | Admitting: Cardiology

## 2023-06-20 VITALS — BP 130/60 | HR 74 | Ht 68.0 in | Wt 216.8 lb

## 2023-06-20 DIAGNOSIS — Z8279 Family history of other congenital malformations, deformations and chromosomal abnormalities: Secondary | ICD-10-CM | POA: Insufficient documentation

## 2023-06-20 DIAGNOSIS — I2510 Atherosclerotic heart disease of native coronary artery without angina pectoris: Secondary | ICD-10-CM | POA: Diagnosis not present

## 2023-06-20 DIAGNOSIS — I10 Essential (primary) hypertension: Secondary | ICD-10-CM | POA: Insufficient documentation

## 2023-06-20 DIAGNOSIS — E785 Hyperlipidemia, unspecified: Secondary | ICD-10-CM | POA: Insufficient documentation

## 2023-06-20 MED ORDER — ATORVASTATIN CALCIUM 80 MG PO TABS: 80.0000 mg | ORAL_TABLET | Freq: Every day | ORAL | 0 refills | Status: DC

## 2023-06-20 NOTE — Patient Instructions (Signed)
 Medication Instructions:  Continue current medications *If you need a refill on your cardiac medications before your next appointment, please call your pharmacy*  Lab Work: none If you have labs (blood work) drawn today and your tests are completely normal, you will receive your results only by: MyChart Message (if you have MyChart) OR A paper copy in the mail If you have any lab test that is abnormal or we need to change your treatment, we will call you to review the results.  Testing/Procedures: none  Follow-Up: At [facility], you and your health needs are our priority.  As part of our continuing mission to provide you with exceptional heart care, our providers are all part of one team.  This team includes your primary Cardiologist (physician) and Advanced Practice Providers or APPs (Physician Assistants and Nurse Practitioners) who all work together to provide you with the care you need, when you need it.  Your next appointment:   1 year(s)  Provider:   Wendie Hamburg, MD    We recommend signing up for the patient portal called "MyChart".  Sign up information is provided on this After Visit Summary.  MyChart is used to connect with patients for Virtual Visits (Telemedicine).  Patients are able to view lab/test results, encounter notes, upcoming appointments, etc.  Non-urgent messages can be sent to your provider as well.   To learn more about what you can do with MyChart, go to [email or web address].   Other Instructions none

## 2023-07-07 ENCOUNTER — Other Ambulatory Visit: Payer: Self-pay | Admitting: Cardiology

## 2023-07-30 ENCOUNTER — Other Ambulatory Visit: Payer: Self-pay | Admitting: Cardiology

## 2023-08-18 ENCOUNTER — Ambulatory Visit (INDEPENDENT_AMBULATORY_CARE_PROVIDER_SITE_OTHER): Admitting: Pharmacist

## 2023-08-18 ENCOUNTER — Other Ambulatory Visit: Payer: Self-pay

## 2023-08-18 ENCOUNTER — Other Ambulatory Visit (HOSPITAL_COMMUNITY): Payer: Self-pay

## 2023-08-18 ENCOUNTER — Other Ambulatory Visit (HOSPITAL_COMMUNITY): Payer: Self-pay | Admitting: Physician Assistant

## 2023-08-18 ENCOUNTER — Ambulatory Visit (HOSPITAL_COMMUNITY)
Admission: RE | Admit: 2023-08-18 | Discharge: 2023-08-18 | Disposition: A | Discharge status: Home / Self Care | Source: Ambulatory Visit | Attending: Vascular Surgery | Admitting: Vascular Surgery

## 2023-08-18 VITALS — BP 121/72 | HR 65 | Wt 213.8 lb

## 2023-08-18 DIAGNOSIS — E1065 Type 1 diabetes mellitus with hyperglycemia: Secondary | ICD-10-CM

## 2023-08-18 DIAGNOSIS — M7989 Other specified soft tissue disorders: Secondary | ICD-10-CM

## 2023-08-18 DIAGNOSIS — I82452 Acute embolism and thrombosis of left peroneal vein: Secondary | ICD-10-CM | POA: Diagnosis present

## 2023-08-18 MED ORDER — TRESIBA FLEXTOUCH 200 UNIT/ML ~~LOC~~ SOPN: PEN_INJECTOR | SUBCUTANEOUS | 2 refills | Status: DC

## 2023-08-18 MED ORDER — APIXABAN 5 MG PO TABS
5.0000 mg | ORAL_TABLET | Freq: Two times a day (BID) | ORAL | 1 refills | Status: DC
Start: 2023-08-18 — End: 2023-11-11

## 2023-08-18 MED ORDER — APIXABAN (ELIQUIS) VTE STARTER PACK (10MG AND 5MG)
ORAL_TABLET | ORAL | 0 refills | Status: DC
  Filled 2023-08-18: qty 74, 30d supply, fill #0

## 2023-08-18 NOTE — Patient Instructions (Signed)
-  Start apixaban  (Eliquis ) 10 mg twice daily for 7 days followed by 5 mg twice daily. -Your refills have been sent to [facility]. You may need to call the pharmacy to ask them to fill this when you start to run low on your current supply.  -It is important to take your medication around the same time every day.  -Avoid NSAIDs like ibuprofen (Advil, Motrin) and naproxen (Aleve) as well as aspirin doses over 100 mg daily. -Tylenol (acetaminophen) is the preferred over the counter pain medication to lower the risk of bleeding. -Be sure to alert all of your health care providers that you are taking an anticoagulant prior to starting a new medication or having a procedure. -Monitor for signs and symptoms of bleeding (abnormal bruising, prolonged bleeding, nose bleeds, bleeding from gums, discolored urine, black tarry stools). If you have fallen and hit your head OR if your bleeding is severe or not stopping, seek emergency care.  -Go to the emergency room if emergent signs and symptoms of new clot occur (new or worse swelling and pain in an arm or leg, shortness of breath, chest pain, fast or irregular heartbeats, lightheadedness, dizziness, fainting, coughing up blood) or if you experience a significant color change (pale or blue) in the extremity that has the DVT.  -We recommend you wear compression stockings (15-20 mmHg) as long as you are having swelling or pain. Be sure to take them off at night.   If you have any questions or need to reschedule an appointment, please call [phone]. If you are having an emergency, call 911 or present to the nearest emergency room.   What is a DVT?  -Deep vein thrombosis (DVT) is a condition in which a blood clot forms in a vein of the deep venous system which can occur in the lower leg, thigh, pelvis, arm, or neck. This condition is serious and can be life-threatening if the clot travels to the arteries of the lungs and causing a blockage (pulmonary  embolism, PE). A DVT can also damage veins in the leg, which can lead to long-term venous disease, leg pain, swelling, discoloration, and ulcers or sores (post-thrombotic syndrome).  -Treatment may include taking an anticoagulant medication to prevent more clots from forming and the current clot from growing, wearing compression stockings, and/or surgical procedures to remove or dissolve the clot.

## 2023-08-18 NOTE — Telephone Encounter (Signed)
 Requested Prescriptions   Signed Prescriptions Disp Refills   insulin  degludec (TRESIBA  FLEXTOUCH) 200 UNIT/ML FlexTouch Pen 9 mL 2    Sig: inject 44 units daily    Authorizing Provider: DARTHA ERNST    Ordering User: ARLOA JEOFFREY SAILOR

## 2023-08-18 NOTE — Progress Notes (Signed)
 DVT Clinic Note  Name: Donald Lopez     MRN: [id number]     DOB: 08/05/1955     Sex: male  PCP: Norleen Lynwood ORN, MD  Today's Visit: Visit Information: Initial Visit  Referred to DVT Clinic by: [facility] GLENWOOD Dickey Sales, PA-C Referred to CPP by: Dr. Gretta Reason for referral:  Chief Complaint  Patient presents with   DVT   HISTORY OF PRESENT ILLNESS: Donald Lopez is a 68 y.o. male with PMH T1DM, CAD, HTN, HLD, prostate cancer s/p prostatectomy, chronic venous insufficiency, and varicose veins who presents after diagnosis of DVT for medication management. On 08/06/23, he and his wife drove to [place]  which was around 17 hours in the car. Then they drove to [place], [place]  which was another several hours and they drove back to [place] this past Saturday and Sunday (7/27-7/28). Patient reports cramping and worsening left leg discoloration starting around 08/10/23. Notes that he commonly gets cramps at night, but this was worse than usual. He presented to [facility] for further evaluation and they referred him for vascular ultrasound. Ultrasound completed today showed acute DVT in the left peroneal veins and he was referred to DVT Clinic to start treatment. He endorses cramping pain that improves with walking around. He has been using compression stockings at home which helps, but would like to be fitted for new ones today. Of note, he has an upcoming trip to [place] on 09/08/23 which is around a 6-7 hour plane ride.  Positive Thrombotic Risk Factors: Sedentary journey lasting >8 hours within 4 weeks Bleeding Risk Factors: None Present  Negative Thrombotic Risk Factors: Previous VTE, Recent surgery (within 3 months), Recent trauma (within 3 months), Recent admission to hospital with acute illness (within 3 months), Paralysis, paresis, or recent plaster cast immobilization of lower extremity, Central venous catheterization, Bed rest >72 hours within 3 months, Pregnancy, Within 6 weeks  postpartum, Recent cesarean section (within 3 months), Estrogen therapy, Recent COVID diagnosis (within 3 months), Erythropoiesis-stimulating agent, Testosterone therapy, Active cancer, Non-malignant, chronic inflammatory condition, Known thrombophilic condition, Smoking, Obesity, Older age  Rx Insurance Coverage: Medicare Rx Affordability: Eliquis  is $47 per month with his insurance which he says is affordable. Rx Assistance Provided: N/A Preferred Pharmacy: Eliquis  starter pack filled at [facility] during appointment today. Refills sent to [facility] per patient preference.  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 s/p PRP OD  s/p PPV with laser and cryo OS 5/10   TOBACCO USE, QUIT 11/17/2006   Trigger finger    VARICOSE VEINS, LOWER EXTREMITIES 11/17/2006    Past Surgical History:  Procedure Laterality Date   CHOLECYSTECTOMY     KNEE ARTHROSCOPY     Left   PROSTATE SURGERY  10/19/2014   [facility]/ Dr. Clarisse    SHOULDER SURGERY     Left   VASECTOMY     VITRECTOMY  01/19/2007   Right   VITRECTOMY      Social History   Socioeconomic History   Marital status:  Married    Spouse name: Education officer, environmental   Number of children: 3   Years of education: Not on file   Highest education level: Not on file  Occupational History   Occupation: Research officer, political party  Tobacco Use   Smoking status: Never   Smokeless tobacco: Never  Substance and Sexual Activity   Alcohol use: Yes    Comment: 5-6/week    Drug use: No   Sexual activity: Not on file  Other Topics Concern   Not on file  Social History Narrative   Not on file   Social Drivers of Health   Financial Resource Strain: Not on file  Food Insecurity: Not on file  Transportation Needs: Not on file  Physical Activity: Not on file  Stress: Not on file  Social Connections: Not on file  Intimate Partner Violence: Not on file    Family History  Problem Relation Age of Onset   Cancer Father    Hypertension Mother    Varicose Veins Maternal Aunt    Varicose Veins Maternal Grandmother     Allergies as of 08/18/2023 - Review Complete 08/18/2023  Allergen Reaction Noted   Penicillins  05/29/2009    Current Outpatient Medications on File Prior to Visit  Medication Sig Dispense Refill   amLODipine  (NORVASC ) 5 MG tablet Take 1 tablet (5 mg total) by mouth daily. 90 tablet 3   aspirin EC 81 MG tablet Take 81 mg by mouth daily.     atorvastatin  (LIPITOR) 80 MG tablet Take 1 tablet (80 mg total) by mouth daily. 90 tablet 3   cyanocobalamin  (VITAMIN B12) 1000 MCG tablet Take 1,000 mcg by mouth 2 (two) times a week.     dorzolamide-timolol (COSOPT) 2-0.5 % ophthalmic solution Place 1 drop into the right eye 2 (two) times daily.     insulin  aspart (NOVOLOG ) 100 UNIT/ML injection Inject 2-5 Units into the skin 3 (three) times daily with meals. 30 mL 0   lisinopril  (ZESTRIL ) 20 MG tablet TAKE ONE TABLET ONCE DAILY 90 tablet 3   VITAMIN D , CHOLECALCIFEROL, PO Take 1 capsule by mouth daily.     ACCU-CHEK GUIDE test strip CHECK BLOOD SUGAR FIVE TIMES DAILY 450 strip 0   Accu-Chek Softclix Lancets lancets Use to check blood sugar 5 times per day. Dx Code: E10.8 100 each 12   Alcohol Swabs (B-D SINGLE USE SWABS REGULAR) PADS USE AS DIRECTED 100 each 0   Blood Glucose Calibration (ACCU-CHEK GUIDE CONTROL) LIQD USE AS DIRECTED WITH GLUCOSE METER 1 each 3   Blood Glucose Monitoring Suppl (ACCU-CHEK GUIDE) w/Device KIT USE AS DIRECTED 1 kit 0    cholecalciferol (VITAMIN D3) 25 MCG (1000 UNIT) tablet Take 1,000 Units by mouth daily.     Continuous Glucose Sensor (FREESTYLE LIBRE 3 PLUS SENSOR) MISC Inject 1 Device into the skin continuous. Change every 15 days 6 each 3   Continuous Glucose Sensor (FREESTYLE LIBRE 3 SENSOR) MISC Change every 14 days 6 each 3   Glucagon  (GVOKE HYPOPEN  1-PACK) 1 MG/0.2ML SOAJ Inject 1 mg into the skin as needed (low blood sugar with imapoired consciousness). 0.4 mL 2   glucose blood (ACCU-CHEK AVIVA PLUS) test strip Check blood sugar 3 times a day 100 each 3   Insulin  Pen Needle (SURE COMFORT PEN NEEDLES) 32G X 4 MM MISC Inject 1 Needle into the skin daily. 100 each 1   Insulin  Syringe-Needle U-100 (DROPLET INSULIN  SYRINGE) 30G X 1/2 0.3 ML MISC USE AS DIRECTED FOUR TIMES A  DAY 400 each 2   sildenafil  (VIAGRA ) 100 MG tablet Take 1 tablet (100 mg total) by mouth daily as needed for erectile dysfunction. (Patient not taking: Reported on 08/18/2023) 30 tablet 5   No current facility-administered medications on file prior to visit.   REVIEW OF SYSTEMS:  Review of Systems  Respiratory:  Negative for shortness of breath.   Cardiovascular:  Positive for leg swelling. Negative for chest pain.  Musculoskeletal:  Positive for myalgias.   PHYSICAL EXAMINATION:  Vitals:   08/18/23 1113  BP: 121/72  Pulse: 65  SpO2: 98%  Weight: 213 lb 12.8 oz (97 kg)    Body mass index is 32.51 kg/m.  Physical Exam Cardiovascular:     Rate and Rhythm: Normal rate.  Pulmonary:     Effort: Pulmonary effort is normal.  Musculoskeletal:        General: Tenderness present.     Left lower leg: Edema (mild, non-pitting) present.  Skin:    Findings: Erythema present.  Neurological:     Mental Status: He is alert.  Psychiatric:        Mood and Affect: Mood normal.    Villalta Score for Post-Thrombotic Syndrome: Pain: Mild Cramps: Mild Heaviness: Absent Paresthesia: Absent Pruritus: Absent Pretibial Edema: Mild Skin  Induration: Absent Hyperpigmentation: Mild Redness: Mild Venous Ectasia: Mild Pain on calf compression: Absent Villalta Preliminary Score: 6 Is venous ulcer present?: No If venous ulcer is present and score is <15, then 15 points total are assigned: Absent Villalta Total Score: 6  LABS:  CBC     Component Value Date/Time   WBC 9.4 01/06/2023 1455   RBC 4.85 01/06/2023 1455   HGB 15.3 01/06/2023 1455   HCT 45.8 01/06/2023 1455   PLT 273.0 01/06/2023 1455   MCV 94.4 01/06/2023 1455   MCH 30.8 02/25/2017 1149   MCHC 33.4 01/06/2023 1455   RDW 14.2 01/06/2023 1455   LYMPHSABS 2.4 01/06/2023 1455   MONOABS 0.7 01/06/2023 1455   EOSABS 0.3 01/06/2023 1455   BASOSABS 0.1 01/06/2023 1455    Hepatic Function      Component Value Date/Time   PROT 6.3 01/06/2023 1455   ALBUMIN 3.8 01/06/2023 1455   AST 20 01/06/2023 1455   ALT 21 01/06/2023 1455   ALKPHOS 84 01/06/2023 1455   BILITOT 0.6 01/06/2023 1455   BILIDIR 0.2 01/06/2023 1455    Renal Function   Lab Results  Component Value Date   CREATININE 0.75 01/06/2023   CREATININE 0.80 06/09/2022   CREATININE 0.89 01/25/2022    CrCl cannot be calculated (Patient's most recent lab result is older than the maximum 21 days allowed.).   VVS Vascular Lab Studies:  08/18/23 VAS US  LOWER EXTREMITY VENOUS (DVT): Summary:  RIGHT:  - No evidence of common femoral vein obstruction.     LEFT:  - Findings consistent with acute deep vein thrombosis involving the left  peroneal veins.  - No cystic structure found in the popliteal fossa.  ASSESSMENT: Location of DVT: Left distal vein Cause of DVT: provoked by a transient risk factor  Patient without prior history of DVT diagnosed with acute DVT in the left peroneal veins. DVT risk factors include recent prolonged sedentary period when he drove to [place]  and [place] . Appropriate to start on treatment with Eliquis  VTE starter pack dosing. No concerns on labs for starting Eliquis .  Counseled patient extensively on Eliquis . Will plan to treat for 3 months for first provoked DVT. No barriers to medication access or adherence  identified today. Discussed upcoming trip to [place] and reviewed VVS recommendation to avoid long plane rides for 4 weeks after DVT. Fitted him for compression stockings today. Encouraged him to wear compression, stay well hydrated, and get up frequently to move around with future prolonged travel. All patient questions were answered.  PLAN: -Start apixaban  (Eliquis ) 10 mg twice daily for 7 days followed by 5 mg twice daily. -Expected duration of therapy: 3 months. Therapy started on 08/18/2023. -Patient educated on purpose, proper use and potential adverse effects of apixaban  (Eliquis ). -Discussed importance of taking medication around the same time every day. -Advised patient of medications to avoid (NSAIDs, aspirin doses >100 mg daily). -Educated that Tylenol (acetaminophen) is the preferred analgesic to lower the risk of bleeding. -Advised patient to alert all providers of anticoagulation therapy prior to starting a new medication or having a procedure. -Emphasized importance of monitoring for signs and symptoms of bleeding (abnormal bruising, prolonged bleeding, nose bleeds, bleeding from gums, discolored urine, black tarry stools). -Educated patient to present to the ED if emergent signs and symptoms of new thrombosis occur. -Measured patient for compression stockings. -Counseled patient to wear compression stockings daily, removing at night.  Follow up: DVT Clinic in 3 months  Izetta Henry, PharmD Deep [facility]rombosis [facility] Clinical Pharmacist

## 2023-09-07 ENCOUNTER — Telehealth: Payer: Self-pay | Admitting: Pharmacist

## 2023-09-07 NOTE — Telephone Encounter (Addendum)
 Returned patient's call regarding worsening leg pain and he stated that his symptoms have already improved. He has started exercising and walking the dog more over the past few days which he thinks may have irritated it. In the past couple of days he noticed increased pain in his left calf and foot. Symptoms are worse in the morning and then improve when walking around during the day. He wears compression stockings daily and elevates his leg when able. He endorses only 1 missed dose of Eliquis , but otherwise he is adherent to taking it at 6 am/6 pm. Given his symptoms have already improved from this morning, no concerns at this time. Discussed that it is common for pain and swelling to wax and wane over time after DVT. Counseled to reach out to us  if he has any other questions or concerns.

## 2023-10-03 ENCOUNTER — Ambulatory Visit: Admitting: "Endocrinology

## 2023-10-06 ENCOUNTER — Other Ambulatory Visit: Payer: Self-pay

## 2023-10-06 DIAGNOSIS — E108 Type 1 diabetes mellitus with unspecified complications: Secondary | ICD-10-CM

## 2023-10-06 MED ORDER — INSULIN ASPART 100 UNIT/ML IJ SOLN: 2.0000 [IU] | Freq: Three times a day (TID) | INTRAMUSCULAR | 0 refills | Status: AC

## 2023-10-13 ENCOUNTER — Other Ambulatory Visit: Payer: Self-pay

## 2023-10-13 DIAGNOSIS — E108 Type 1 diabetes mellitus with unspecified complications: Secondary | ICD-10-CM

## 2023-10-13 MED ORDER — DROPLET INSULIN SYRINGE 30G X 1/2" 0.3 ML MISC: 2 refills | Status: AC

## 2023-10-20 ENCOUNTER — Other Ambulatory Visit: Payer: Self-pay

## 2023-10-20 DIAGNOSIS — E1065 Type 1 diabetes mellitus with hyperglycemia: Secondary | ICD-10-CM

## 2023-10-20 MED ORDER — SURE COMFORT PEN NEEDLES 32G X 4 MM MISC
1.0000 | Freq: Every day | 3 refills | Status: AC
Start: 2023-10-20 — End: ?

## 2023-10-24 ENCOUNTER — Ambulatory Visit: Admitting: "Endocrinology

## 2023-10-25 ENCOUNTER — Other Ambulatory Visit: Payer: Self-pay

## 2023-10-28 ENCOUNTER — Other Ambulatory Visit: Payer: Self-pay | Admitting: Cardiology

## 2023-11-10 NOTE — Progress Notes (Unsigned)
 DVT Clinic Note  Name: Donald Lopez     MRN: [id number]     DOB: October 26, 1955     Sex: male  PCP: Norleen Lynwood ORN, MD  Today's Visit: Visit Information: Discharge Visit  Referred to DVT Clinic by: [facility] GLENWOOD Dickey Sales, PA-C Referred to CPP by: Dr. Pearline Reason for referral:  Chief Complaint  Patient presents with   DVT   HISTORY OF PRESENT ILLNESS: Donald Lopez is a 68 y.o. male with PMH T1DM, CAD, HTN, HLD, prostate cancer s/p prostatectomy, chronic venous insufficiency, and varicose veins who presents for follow up medication management after diagnosis of DVT on 08/18/23. On 08/06/23, he and his wife drove to [place]  which was around 17 hours in the car. Then they drove to [place], [place]  which was another several hours and they drove back to [place] this past Saturday and Sunday (7/27-7/28). Patient reports cramping and worsening left leg discoloration starting around 08/10/23. Notes that he commonly gets cramps at night, but this was worse than usual. He went to [facility] for further evaluation and they referred him for vascular ultrasound. Ultrasound showed acute DVT in the left peroneal veins. Last seen in DVT Clinic 08/18/23 at which time he was started on Eliquis  with plan to treat for 3 month for first provoked DVT. He endorsed cramping pain that improved with walking around. He was wearing compression stockings.   Today, patient reports he has been feeling great. Endorses some bruising on his arms and an incident where he had a cut on his foot that bled a little more than normal, but otherwise denies abnormal bleeding or bruising. Endorses around 4 missed doses of Eliquis  over the past 3 months. Reports he has about a week left of Eliquis  at home. Endorses wearing compression stockings every day. Reports only occasionally feeling pain in his left calf. He has been more active recently and also cut out drinking beer and says he has lost 10 lbs. Patient stated that he stopped  taking aspirin 81 mg daily while on Eliquis  and planned to restart aspirin once he finishes Eliquis .  Positive Thrombotic Risk Factors: Sedentary journey lasting >8 hours within 4 weeks Bleeding Risk Factors: None Present  Negative Thrombotic Risk Factors: Previous VTE, Recent surgery (within 3 months), Recent trauma (within 3 months), Recent admission to hospital with acute illness (within 3 months), Paralysis, paresis, or recent plaster cast immobilization of lower extremity, Central venous catheterization, Bed rest >72 hours within 3 months, Pregnancy, Estrogen therapy, Erythropoiesis-stimulating agent, Recent cesarean section (within 3 months), Within 6 weeks postpartum, Testosterone therapy, Recent COVID diagnosis (within 3 months), Known thrombophilic condition, Smoking, Non-malignant, chronic inflammatory condition, Active cancer, Older age, Obesity  Rx Insurance Coverage: Medicare Rx Affordability: Eliquis  is $47 per month with his insurance which he says is affordable.  Rx Assistance Provided: N/A Preferred Pharmacy: Eliquis  starter pack filled at [facility] during initial appointment. Refills sent to [facility] per patient preference.   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 s/p PRP OD  s/p PPV with laser and cryo OS 5/10   TOBACCO USE, QUIT 11/17/2006   Trigger finger    VARICOSE VEINS, LOWER EXTREMITIES 11/17/2006    Past Surgical History:  Procedure Laterality Date   CHOLECYSTECTOMY     KNEE ARTHROSCOPY      Left   PROSTATE SURGERY  10/19/2014   [facility] [place]/ Dr. Clarisse    SHOULDER SURGERY     Left   VASECTOMY     VITRECTOMY  01/19/2007   Right   VITRECTOMY      Social History   Socioeconomic History   Marital status: Married    Spouse name: Education officer, environmental   Number of children: 3   Years of education: Not on file   Highest education level: Not on file  Occupational History   Occupation: Research officer, political party  Tobacco Use   Smoking status: Never   Smokeless tobacco: Never  Substance and Sexual Activity   Alcohol use: Yes    Comment: 5-6/week   Drug use: No   Sexual activity: Not on file  Other Topics Concern   Not on file  Social History Narrative   Not on file   Social Drivers of Health   Financial Resource Strain: Not on file  Food Insecurity: Not on file  Transportation Needs: Not on file  Physical Activity: Not on file  Stress: Not on file  Social Connections: Not on file  Intimate Partner Violence: Not on file    Family History  Problem Relation Age of Onset   Cancer Father    Hypertension Mother    Varicose Veins Maternal Aunt    Varicose Veins Maternal Grandmother     Allergies as of 11/11/2023 - Review Complete 08/18/2023  Allergen Reaction Noted   Penicillins  05/29/2009    Current Outpatient Medications on File Prior to Visit  Medication Sig Dispense Refill   ACCU-CHEK GUIDE test strip CHECK BLOOD SUGAR FIVE TIMES DAILY 450 strip 0   Accu-Chek Softclix Lancets lancets Use to check blood sugar 5 times per day. Dx Code: E10.8 100 each 12   Alcohol Swabs (B-D SINGLE USE SWABS REGULAR) PADS USE AS DIRECTED 100 each 0   amLODipine  (NORVASC ) 5 MG tablet Take 1 tablet (5 mg total) by mouth daily. 90 tablet 3   aspirin EC 81 MG tablet Take 81 mg by mouth daily. (Patient not taking: Reported on 11/11/2023)     atorvastatin  (LIPITOR) 80 MG tablet Take 1 tablet (80 mg total) by mouth daily. 90 tablet 3   Blood Glucose Calibration (ACCU-CHEK  GUIDE CONTROL) LIQD USE AS DIRECTED WITH GLUCOSE METER 1 each 3   Blood Glucose Monitoring Suppl (ACCU-CHEK GUIDE) w/Device KIT USE AS DIRECTED 1 kit 0   cholecalciferol (VITAMIN D3) 25 MCG (1000 UNIT) tablet Take 1,000 Units by mouth daily.     Continuous Glucose Sensor (FREESTYLE LIBRE 3 PLUS SENSOR) MISC Inject 1 Device into the skin continuous. Change every 15 days 6 each 3   Continuous Glucose Sensor (FREESTYLE LIBRE 3 SENSOR) MISC Change every 14 days 6 each 3   cyanocobalamin  (VITAMIN B12) 1000 MCG tablet Take 1,000 mcg by mouth 2 (two) times a week.     dorzolamide-timolol (COSOPT) 2-0.5 % ophthalmic solution Place 1 drop into the right eye 2 (two) times daily.     Glucagon  (GVOKE HYPOPEN  1-PACK) 1 MG/0.2ML SOAJ Inject 1 mg into the skin as needed (low blood sugar with imapoired consciousness). 0.4 mL 2   glucose blood (ACCU-CHEK  AVIVA PLUS) test strip Check blood sugar 3 times a day 100 each 3   insulin  aspart (NOVOLOG ) 100 UNIT/ML injection Inject 2-5 Units into the skin 3 (three) times daily with meals. 30 mL 0   insulin  degludec (TRESIBA  FLEXTOUCH) 200 UNIT/ML FlexTouch Pen inject 44 units daily 9 mL 2   Insulin  Pen Needle (SURE COMFORT PEN NEEDLES) 32G X 4 MM MISC Inject 1 Needle into the skin daily. 200 each 3   Insulin  Syringe-Needle U-100 (DROPLET INSULIN  SYRINGE) 30G X 1/2 0.3 ML MISC USE AS DIRECTED FOUR TIMES A DAY 400 each 2   lisinopril  (ZESTRIL ) 20 MG tablet TAKE ONE TABLET ONCE DAILY 90 tablet 3   sildenafil  (VIAGRA ) 100 MG tablet Take 1 tablet (100 mg total) by mouth daily as needed for erectile dysfunction. (Patient not taking: Reported on 08/18/2023) 30 tablet 5   VITAMIN D , CHOLECALCIFEROL, PO Take 1 capsule by mouth daily.     No current facility-administered medications on file prior to visit.   REVIEW OF SYSTEMS:  Review of Systems  Respiratory:  Negative for shortness of breath.   Cardiovascular:  Negative for chest pain and leg swelling.  Musculoskeletal:   Negative for myalgias.   PHYSICAL EXAMINATION:  Vitals:   11/11/23 0916  BP: 130/73  Pulse: 75  SpO2: 98%    There is no height or weight on file to calculate BMI.  Physical Exam Vitals reviewed.  Pulmonary:     Effort: Pulmonary effort is normal.  Musculoskeletal:        General: No tenderness.     Left lower leg: No edema.  Neurological:     Mental Status: He is alert.  Psychiatric:        Mood and Affect: Mood normal.    Villalta Score for Post-Thrombotic Syndrome: Pain: Absent Cramps: Absent Heaviness: Absent Paresthesia: Absent Pruritus: Absent Pretibial Edema: Absent Skin Induration: Absent Hyperpigmentation: Absent Redness: Absent Venous Ectasia: Absent Pain on calf compression: Mild Villalta Preliminary Score: 1 Is venous ulcer present?: No If venous ulcer is present and score is <15, then 15 points total are assigned: Absent Villalta Total Score: 1  LABS:  CBC     Component Value Date/Time   WBC 9.4 01/06/2023 1455   RBC 4.85 01/06/2023 1455   HGB 15.3 01/06/2023 1455   HCT 45.8 01/06/2023 1455   PLT 273.0 01/06/2023 1455   MCV 94.4 01/06/2023 1455   MCH 30.8 02/25/2017 1149   MCHC 33.4 01/06/2023 1455   RDW 14.2 01/06/2023 1455   LYMPHSABS 2.4 01/06/2023 1455   MONOABS 0.7 01/06/2023 1455   EOSABS 0.3 01/06/2023 1455   BASOSABS 0.1 01/06/2023 1455    Hepatic Function      Component Value Date/Time   PROT 6.3 01/06/2023 1455   ALBUMIN 3.8 01/06/2023 1455   AST 20 01/06/2023 1455   ALT 21 01/06/2023 1455   ALKPHOS 84 01/06/2023 1455   BILITOT 0.6 01/06/2023 1455   BILIDIR 0.2 01/06/2023 1455    Renal Function   Lab Results  Component Value Date   CREATININE 0.75 01/06/2023   CREATININE 0.80 06/09/2022   CREATININE 0.89 01/25/2022    CrCl cannot be calculated (Patient's most recent lab result is older than the maximum 21 days allowed.).   [facility] Vascular Lab Studies:  08/18/23 VAS US  LOWER EXTREMITY VENOUS (DVT): Summary:  RIGHT:   - No evidence of common femoral vein obstruction.     LEFT:  - Findings consistent with acute deep vein thrombosis  involving the left  peroneal veins.  - No cystic structure found in the popliteal fossa.  ASSESSMENT: Location of DVT: Left distal vein Cause of DVT: provoked by a transient risk factor  Patient without prior history of DVT diagnosed with acute DVT in the left peroneal veins. DVT risk factors include prolonged sedentary period when he drove to [place]  and [place] . He was started on Eliquis  with plan to treat for 3 months for first provoked DVT. He has tolerated the medication well and has about a week left of Eliquis  at home. His LLE pain has mostly resolved. He has chronic swelling from venous insufficiency and this has returned to his baseline. Encouraged continued adherence to compression stockings daily and he purchased new compression stockings today. Will have him finish his current supply of Eliquis  then discontinue the medication as he will have completed 3 months of anticoagulation. Counseled on DVT risk reduction strategies with future travel and to let all providers know of his DVT history. Counseled patient to restart aspirin 81 mg daily after finishing Eliquis  next week. Patient confirmed understanding and all of his questions were answered.  PLAN: -Patient is discharged from the DVT Clinic. -Discontinue anticoagulation with Eliquis  after finishing his current supply of medication, as patient has completed 3 months of treatment for provoked DVT.  -Counseled patient on future VTE risk reduction strategies and to inform all future providers of DVT history.  Follow up: No further follow up in DVT Clinic needed   Izetta Henry, PharmD [facility] [phone]

## 2023-11-11 ENCOUNTER — Ambulatory Visit: Attending: Vascular Surgery | Admitting: Pharmacist

## 2023-11-11 VITALS — BP 130/73 | HR 75

## 2023-11-11 DIAGNOSIS — I82452 Acute embolism and thrombosis of left peroneal vein: Secondary | ICD-10-CM | POA: Insufficient documentation

## 2023-11-11 DIAGNOSIS — M7989 Other specified soft tissue disorders: Secondary | ICD-10-CM

## 2023-11-11 NOTE — Patient Instructions (Signed)
-  Finish your current supply of Eliquis  then stop the medication.  If you have any questions or need to reschedule an appointment, please call [phone].

## 2023-11-21 ENCOUNTER — Encounter: Payer: Self-pay | Admitting: "Endocrinology

## 2023-11-21 ENCOUNTER — Other Ambulatory Visit

## 2023-11-21 ENCOUNTER — Ambulatory Visit (INDEPENDENT_AMBULATORY_CARE_PROVIDER_SITE_OTHER): Admitting: "Endocrinology

## 2023-11-21 VITALS — BP 120/70 | HR 84 | Ht 68.0 in | Wt 204.0 lb

## 2023-11-21 DIAGNOSIS — E1065 Type 1 diabetes mellitus with hyperglycemia: Secondary | ICD-10-CM

## 2023-11-21 DIAGNOSIS — E7800 Pure hypercholesterolemia, unspecified: Secondary | ICD-10-CM

## 2023-11-21 DIAGNOSIS — Z794 Long term (current) use of insulin: Secondary | ICD-10-CM | POA: Diagnosis not present

## 2023-11-21 LAB — POCT GLYCOSYLATED HEMOGLOBIN (HGB A1C): Hemoglobin A1C: 6.8 % — AB (ref 4.0–5.6)

## 2023-11-21 NOTE — Progress Notes (Signed)
 Outpatient Endocrinology Note Donald Birmingham, MD  11/21/23   Donald Lopez [birth year] [id number]  Referring Provider: Norleen Lopez ORN, MD Primary Care Provider: Norleen Lopez ORN, MD Reason for consultation: Subjective   Assessment & Plan  Diagnoses and all orders for this visit:  Uncontrolled type 1 diabetes mellitus with hyperglycemia (HCC) -     POCT glycosylated hemoglobin (Hb A1C) -     Microalbumin / creatinine urine ratio -     Comprehensive metabolic panel with GFR -     Lipid panel  Insulin  dose changed (HCC)  Pure hypercholesterolemia   Diabetes complicated by retinopathy Hba1c goal less than 7.0, current Hba1c is  Lab Results  Component Value Date   HGBA1C 6.8 (A) 11/21/2023   HGBA1C 6.9 (A) 06/02/2023   HGBA1C 7.3 (H) 01/06/2023    Will recommend: Tresiba  45 units qam NovoLog  vial 2 for break fast, 4 units and lunch and dinner 15 min before meals   Novolog  correction scale: Use in addition to your meal time/short acting insulin  based on blood sugars as follows:  151 - 190: 1 unit 191 - 230: 2 units 231 - 270: 3 units 271 - 310: 4 units 311 - 350: 5 units 351 - 390: 6 units 391 - 430: 7 units   Cut down short acting insulin  to half if eating only half a meal or if blood sugar is between 71-100 before a meal Skip short acting insulin  if blood sugar is less than 70 and treat with 15 gms of carbohydrates every 15 min until blood sugar is more than 100  Instructed to keep the blood sugar target above 100 during the periods of activity Eat snack before taking dog for walk in morning to prevent occasional low Has alarm set up for lows at 80  No known contraindications to any of above medications Glucagon  described and sent on 06/11/22  Hyperlipidemia -Last LDL near goal: 65 -on atorvastatin  80 mg QD -Follow low fat diet and exercise   -Blood pressure goal <140/90 - Microalbumin/creatinine at goal < 30 - on ACE/ARB lisinopril  20 mg qd  -diet  changes including salt restriction -limit eating outside -counseled BP targets per standards of diabetes care -Uncontrolled blood pressure can lead to retinopathy, nephropathy and cardiovascular and atherosclerotic heart disease  Reviewed and counseled on: -A1C target -Blood sugar targets -Complications of uncontrolled diabetes  -Checking blood sugar before meals and bedtime and bring log next visit -All medications with mechanism of action and side effects -Hypoglycemia management: rule of 15's, Glucagon  Emergency Kit and medical alert ID -low-carb low-fat plate-method diet -At least 20 minutes of physical activity per day -Annual dilated retinal eye exam and foot exam -compliance and follow up needs -follow up as scheduled or earlier if problem gets worse  Call if blood sugar is less than 70 or consistently above 250    Take a 15 gm snack of carbohydrate at bedtime before you go to sleep if your blood sugar is less than 100.    If you are going to fast after midnight for a test or procedure, ask your physician for instructions on how to reduce/decrease your insulin  dose.    Call if blood sugar is less than 70 or consistently above 250  -Treating a low sugar by rule of 15  (15 gms of sugar every 15 min until sugar is more than 70) If you feel your sugar is low, test your sugar to be sure If your sugar is  low (less than 70), then take 15 grams of a fast acting Carbohydrate (3-4 glucose tablets or glucose gel or 4 ounces of juice or regular soda) Recheck your sugar 15 min after treating low to make sure it is more than 70 If sugar is still less than 70, treat again with 15 grams of carbohydrate          Don't drive the hour of hypoglycemia  If unconscious/unable to eat or drink by mouth, use glucagon  injection or nasal spray baqsimi and call 911. Can repeat again in 15 min if still unconscious.  Return in about 3 months (around 02/21/2024) for visit, labs today.   I have reviewed  current medications, nurse's notes, allergies, vital signs, past medical and surgical history, family medical history, and social history for this encounter. Counseled patient on symptoms, examination findings, lab findings, imaging results, treatment decisions and monitoring and prognosis. The patient understood the recommendations and agrees with the treatment plan. All questions regarding treatment plan were fully answered.  Donald Birmingham, MD  11/21/23    History of Present Illness Donald Lopez is a 68 y.o. year old male who presents for follow up of Type 1 diabetes mellitus.  Donald Lopez was first diagnosed in 1985.   Diabetes education +  Home diabetes regimen: Tresiba  42-44 units qam NovoLog  vial 2-5 for break fast, 2-7 units and lunch and dinner   Prior history: He has been on multiple injections since diagnosis A1c in the last few years has ranged between 7.9 and 8.7  Recent history:   His A1c is significantly better at 7.2 compared to 8.5 on his initial visit and previously best was 7.9    He has been on Tresiba  instead of Lantus  since his last visit  COMPLICATIONS -  MI/Stroke + retinopathy, last eye exam 2024 -  neuropathy -  nephropathy  BLOOD SUGAR DATA CGM interpretation: At today's visit, we reviewed her 68 CGM downloads. The full report is scanned in the media. Reviewing the CGM trends, BG are variable across the day, with 75% being in target, 25% highs and 1% lows.  Particularly between 12 AM to 8 AM is a period with majority highs and some lows.  Physical Exam  BP 120/70   Pulse 84   Ht 5' 8 (1.727 m)   Wt 204 lb (92.5 kg)   SpO2 98%   BMI 31.02 kg/m    Constitutional: well developed, well nourished Head: normocephalic, atraumatic Eyes: sclera anicteric, no redness Neck: supple Lungs: normal respiratory effort Neurology: alert and oriented Skin: dry, no appreciable rashes Musculoskeletal: no appreciable defects Psychiatric: normal mood  and affect Diabetic Foot Exam - Simple   No data filed      Current Medications Patient's Medications  New Prescriptions   No medications on file  Previous Medications   ACCU-CHEK GUIDE TEST STRIP    CHECK BLOOD SUGAR FIVE TIMES DAILY   ACCU-CHEK SOFTCLIX LANCETS LANCETS    Use to check blood sugar 5 times per day. Dx Code: E10.8   ALCOHOL SWABS (B-D SINGLE USE SWABS REGULAR) PADS    USE AS DIRECTED   AMLODIPINE  (NORVASC ) 5 MG TABLET    Take 1 tablet (5 mg total) by mouth daily.   ASPIRIN EC 81 MG TABLET    Take 81 mg by mouth daily.   ATORVASTATIN  (LIPITOR) 80 MG TABLET    Take 1 tablet (80 mg total) by mouth daily.   BLOOD GLUCOSE CALIBRATION (ACCU-CHEK GUIDE CONTROL) LIQD  USE AS DIRECTED WITH GLUCOSE METER   BLOOD GLUCOSE MONITORING SUPPL (ACCU-CHEK GUIDE) W/DEVICE KIT    USE AS DIRECTED   CHOLECALCIFEROL (VITAMIN D3) 25 MCG (1000 UNIT) TABLET    Take 1,000 Units by mouth daily.   CONTINUOUS GLUCOSE SENSOR (FREESTYLE LIBRE 3 PLUS SENSOR) MISC    Inject 1 Device into the skin continuous. Change every 15 days   CONTINUOUS GLUCOSE SENSOR (FREESTYLE LIBRE 3 SENSOR) MISC    Change every 14 days   CYANOCOBALAMIN  (VITAMIN B12) 1000 MCG TABLET    Take 1,000 mcg by mouth 2 (two) times a week.   DORZOLAMIDE-TIMOLOL (COSOPT) 2-0.5 % OPHTHALMIC SOLUTION    Place 1 drop into the right eye 2 (two) times daily.   GLUCAGON  (GVOKE HYPOPEN  1-PACK) 1 MG/0.2ML SOAJ    Inject 1 mg into the skin as needed (low blood sugar with imapoired consciousness).   GLUCOSE BLOOD (ACCU-CHEK AVIVA PLUS) TEST STRIP    Check blood sugar 3 times a day   INSULIN  ASPART (NOVOLOG ) 100 UNIT/ML INJECTION    Inject 2-5 Units into the skin 3 (three) times daily with meals.   INSULIN  DEGLUDEC (TRESIBA  FLEXTOUCH) 200 UNIT/ML FLEXTOUCH PEN    inject 44 units daily   INSULIN  PEN NEEDLE (SURE COMFORT PEN NEEDLES) 32G X 4 MM MISC    Inject 1 Needle into the skin daily.   INSULIN  SYRINGE-NEEDLE U-100 (DROPLET INSULIN  SYRINGE) 30G X  1/2 0.3 ML MISC    USE AS DIRECTED FOUR TIMES A DAY   LISINOPRIL  (ZESTRIL ) 20 MG TABLET    TAKE ONE TABLET ONCE DAILY   SILDENAFIL  (VIAGRA ) 100 MG TABLET    Take 1 tablet (100 mg total) by mouth daily as needed for erectile dysfunction.   VITAMIN D , CHOLECALCIFEROL, PO    Take 1 capsule by mouth daily.  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 s/p PRP OD  s/p PPV with laser and cryo OS 5/10   TOBACCO USE, QUIT 11/17/2006   Trigger finger    VARICOSE VEINS, LOWER EXTREMITIES 11/17/2006    Past Surgical History Past Surgical History:  Procedure Laterality Date   CHOLECYSTECTOMY     KNEE ARTHROSCOPY     Left   PROSTATE SURGERY  10/19/2014   [facility]/ Dr. Clarisse    SHOULDER SURGERY     Left   VASECTOMY     VITRECTOMY  01/19/2007   Right   VITRECTOMY      Family History family history includes Cancer in his father; Hypertension in his mother; Varicose Veins in his maternal aunt and maternal grandmother.  Social History Social History   Socioeconomic History   Marital status: Married    Spouse name: Education Officer, Environmental   Number of children: 3   Years of education: Not on file  Highest education level: Not on file  Occupational History   Occupation: research officer, political party  Tobacco Use    Smoking status: Never   Smokeless tobacco: Never  Substance and Sexual Activity   Alcohol use: Yes    Comment: 5-6/week   Drug use: No   Sexual activity: Not on file  Other Topics Concern   Not on file  Social History Narrative   Not on file   Social Drivers of Health   Financial Resource Strain: Not on file  Food Insecurity: Not on file  Transportation Needs: Not on file  Physical Activity: Not on file  Stress: Not on file  Social Connections: Not on file  Intimate Partner Violence: Not on file    Lab Results  Component Value Date   HGBA1C 6.8 (A) 11/21/2023   HGBA1C 6.9 (A) 06/02/2023   HGBA1C 7.3 (H) 01/06/2023   Lab Results  Component Value Date   CHOL 141 01/06/2023   Lab Results  Component Value Date   HDL 62.10 01/06/2023   Lab Results  Component Value Date   LDLCALC 65 01/06/2023   Lab Results  Component Value Date   TRIG 70.0 01/06/2023   Lab Results  Component Value Date   CHOLHDL 2 01/06/2023   Lab Results  Component Value Date   CREATININE 0.75 01/06/2023   Lab Results  Component Value Date   GFR 94.91 01/06/2023   Lab Results  Component Value Date   MICROALBUR 0.5 06/18/2008      Component Value Date/Time   NA 142 01/06/2023 1455   NA 142 11/23/2019 0908   K 3.9 01/06/2023 1455   CL 105 01/06/2023 1455   CO2 29 01/06/2023 1455   GLUCOSE 83 01/06/2023 1455   BUN 11 01/06/2023 1455   BUN 13 11/23/2019 0908   CREATININE 0.75 01/06/2023 1455   CALCIUM  9.1 01/06/2023 1455   PROT 6.3 01/06/2023 1455   ALBUMIN 3.8 01/06/2023 1455   AST 20 01/06/2023 1455   ALT 21 01/06/2023 1455   ALKPHOS 84 01/06/2023 1455   BILITOT 0.6 01/06/2023 1455   GFRNONAA 100 11/23/2019 0908   GFRAA 116 11/23/2019 0908      Latest Ref Rng & Units 01/06/2023    2:55 PM 06/09/2022    3:30 PM 01/25/2022    8:51 AM  BMP  Glucose 70 - 99 mg/dL 83  866  857   BUN 6 - 23 mg/dL 11  15  14    Creatinine 0.40 - 1.50 mg/dL 9.24  9.19  9.10   Sodium 135 - 145  mEq/L 142  140  141   Potassium 3.5 - 5.1 mEq/L 3.9  4.0  4.7   Chloride 96 - 112 mEq/L 105  105  104   CO2 19 - 32 mEq/L 29  25  30    Calcium  8.4 - 10.5 mg/dL 9.1  8.9  9.4        Component Value Date/Time   WBC 9.4 01/06/2023 1455   RBC 4.85 01/06/2023 1455   HGB 15.3 01/06/2023 1455   HCT 45.8 01/06/2023 1455   PLT 273.0 01/06/2023 1455   MCV 94.4 01/06/2023 1455   MCH 30.8 02/25/2017 1149   MCHC 33.4 01/06/2023 1455   RDW 14.2 01/06/2023 1455   LYMPHSABS 2.4 01/06/2023 1455   MONOABS 0.7 01/06/2023 1455   EOSABS 0.3 01/06/2023 1455   BASOSABS 0.1 01/06/2023 1455     Parts of this note may have been dictated using voice recognition software.  There may be variances in spelling and vocabulary which are unintentional. Not all errors are proofread. Please notify the dino if any discrepancies are noted or if the meaning of any statement is not clear.

## 2023-11-21 NOTE — Patient Instructions (Addendum)
 Will recommend: Tresiba  45 units qam NovoLog  vial 2 for break fast, 4 units and lunch and dinner 15 min before meals   Novolog  correction scale: Use in addition to your meal time/short acting insulin  based on blood sugars as follows:  151 - 190: 1 unit 191 - 230: 2 units 231 - 270: 3 units 271 - 310: 4 units 311 - 350: 5 units 351 - 390: 6 units 391 - 430: 7 units   Cut down short acting insulin  to half if eating only half a meal or if blood sugar is between 71-100 before a meal Skip short acting insulin  if blood sugar is less than 70 and treat with 15 gms of carbohydrates every 15 min until blood sugar is more than 100    ______________    Goals of DM therapy:  Morning Fasting blood sugar: 80-140  Blood sugar before meals: 80-140 Bed time blood sugar: 100-150  A1C <7%, limited only by hypoglycemia  1.Diabetes medications and their side effects discussed, including hypoglycemia    2. Check blood glucose:  a) Always check blood sugars before driving. Please see below (under hypoglycemia) on how to manage b) Check a minimum of 3 times/day or more as needed when having symptoms of hypoglycemia.   c) Try to check blood glucose before sleeping/in the middle of the night to ensure that it is remaining stable and not dropping less than 100 d) Check blood glucose more often if sick  3. Diet: a) 3 meals per day schedule b: Restrict carbs to 60-70 grams (4 servings) per meal c) Colorful vegetables - 3 servings a day, and low sugar fruit 2 servings/day Plate control method: 1/4 plate protein, 1/4 starch, 1/2 green, yellow, or red vegetables d) Avoid carbohydrate snacks unless hypoglycemic episode, or increased physical activity  4. Regular exercise as tolerated, preferably 3 or more hours a week  5. Hypoglycemia: a)  Do not drive or operate machinery without first testing blood glucose to assure it is over 90 mg%, or if dizzy, lightheaded, not feeling normal, etc, or  if foot or  leg is numb or weak. b)  If blood glucose less than 70, take four 5gm Glucose tabs or 15-30 gm Glucose gel.  Repeat every 15 min as needed until blood sugar is >100 mg/dl. If hypoglycemia persists then call 911.   6. Sick day management: a) Check blood glucose more often b) Continue usual therapy if blood sugars are elevated.   7. Contact the doctor immediately if blood glucose is frequently <60 mg/dl, or an episode of severe hypoglycemia occurs (where someone had to give you glucose/  glucagon  or if you passed out from a low blood glucose), or if blood glucose is persistently >350 mg/dl, for further management  8. A change in level of physical activity or exercise and a change in diet may also affect your blood sugar. Check blood sugars more often and call if needed.  Instructions: 1. Bring glucose meter, blood glucose records on every visit for review 2. Continue to follow up with primary care physician and other providers for medical care 3. Yearly eye  and foot exam 4. Please get blood work done prior to the next appointment

## 2023-11-22 ENCOUNTER — Encounter: Payer: Self-pay | Admitting: "Endocrinology

## 2023-11-22 LAB — MICROALBUMIN / CREATININE URINE RATIO
Creatinine, Urine: 66 mg/dL (ref 20–320)
Microalb Creat Ratio: 3 mg/g{creat} (ref <30)
Microalb, Ur: 0.2 mg/dL

## 2023-12-28 ENCOUNTER — Other Ambulatory Visit: Payer: Self-pay

## 2023-12-28 ENCOUNTER — Other Ambulatory Visit: Payer: Self-pay | Admitting: "Endocrinology

## 2023-12-28 DIAGNOSIS — E1065 Type 1 diabetes mellitus with hyperglycemia: Secondary | ICD-10-CM

## 2023-12-28 MED ORDER — TRESIBA FLEXTOUCH 200 UNIT/ML ~~LOC~~ SOPN: PEN_INJECTOR | SUBCUTANEOUS | 2 refills | Status: DC

## 2024-01-03 ENCOUNTER — Ambulatory Visit

## 2024-01-05 ENCOUNTER — Ambulatory Visit

## 2024-01-05 VITALS — BP 138/80 | HR 68 | Ht 67.25 in | Wt 208.8 lb

## 2024-01-05 DIAGNOSIS — Z0000 Encounter for general adult medical examination without abnormal findings: Secondary | ICD-10-CM | POA: Diagnosis not present

## 2024-01-05 NOTE — Progress Notes (Signed)
 Chief Complaint  Patient presents with   Medicare Wellness     Subjective:   Donald Lopez is a 68 y.o. male who presents for a Medicare Annual Wellness Visit.  Visit info / Clinical Intake: Medicare Wellness Visit Type:: Initial Annual Wellness Visit Persons participating in visit and providing information:: patient Medicare Wellness Visit Mode:: In-person (required for WTM) Interpreter Needed?: No Pre-visit prep was completed: yes AWV questionnaire completed by patient prior to visit?: no Living arrangements:: lives with spouse/significant other Patient's Overall Health Status Rating: very good Typical amount of pain: some (recent fall/fractured ribs) Does pain affect daily life?: no Are you currently prescribed opioids?: (!) yes (Tylenol)  Dietary Habits and Nutritional Risks How many meals a day?: 3 Eats fruit and vegetables daily?: yes Most meals are obtained by: preparing own meals In the last 2 weeks, have you had any of the following?: none Diabetic:: (!) yes Any non-healing wounds?: no How often do you check your BS?: continuous glucose monitor Would you like to be referred to a Nutritionist or for Diabetic Management? : no  Functional Status Activities of Daily Living (to include ambulation/medication): Independent Ambulation: Independent with device- listed below Home Assistive Devices/Equipment: Eyeglasses Medication Administration: Independent Home Management (perform basic housework or laundry): Independent Manage your own finances?: yes Primary transportation is: driving Concerns about vision?: no *vision screening is required for WTM* (nerve damage in rt eye) Concerns about hearing?: no  Fall Screening Falls in the past year?: 1 Number of falls in past year: 0 Was there an injury with Fall?: 1 (fractured ribs) Fall Risk Category Calculator: 2 Patient Fall Risk Level: Moderate Fall Risk  Fall Risk Patient at Risk for Falls Due to: No Fall  Risks Fall risk Follow up: Falls evaluation completed; Falls prevention discussed  Home and Transportation Safety: All rugs have non-skid backing?: yes All stairs or steps have railings?: N/A, no stairs Grab bars in the bathtub or shower?: (!) no Have non-skid surface in bathtub or shower?: (!) no Good home lighting?: yes Regular seat belt use?: yes Hospital stays in the last year:: no  Cognitive Assessment Difficulty concentrating, remembering, or making decisions? : no Will 6CIT or Mini Cog be Completed: no 6CIT or Mini Cog Declined: patient alert, oriented, able to answer questions appropriately and recall recent events  Advance Directives (For Healthcare) Does Patient Have a Medical Advance Directive?: Yes Type of Advance Directive: Healthcare Power of [place]; Living will Copy of Healthcare Power of Attorney in Chart?: No - copy requested Copy of Living Will in Chart?: No - copy requested  Reviewed/Updated  Reviewed/Updated: Reviewed All (Medical, Surgical, Family, Medications, Allergies, Care Teams, Patient Goals)    Allergies (verified) Penicillins   Current Medications (verified) Outpatient Encounter Medications as of 01/05/2024  Medication Sig   ACCU-CHEK GUIDE test strip CHECK BLOOD SUGAR FIVE TIMES DAILY   aspirin EC 81 MG tablet Take 81 mg by mouth daily.   atorvastatin  (LIPITOR) 80 MG tablet Take 1 tablet (80 mg total) by mouth daily.   cholecalciferol (VITAMIN D3) 25 MCG (1000 UNIT) tablet Take 1,000 Units by mouth daily.   Continuous Glucose Sensor (FREESTYLE LIBRE 3 PLUS SENSOR) MISC Inject 1 Device into the skin continuous. Change every 15 days   Continuous Glucose Sensor (FREESTYLE LIBRE 3 SENSOR) MISC Change every 14 days   cyanocobalamin  (VITAMIN B12) 1000 MCG tablet Take 1,000 mcg by mouth 2 (two) times a week.   dorzolamide-timolol (COSOPT) 2-0.5 % ophthalmic solution Place 1  drop into the right eye 2 (two) times daily.   Glucagon  (GVOKE HYPOPEN   1-PACK) 1 MG/0.2ML SOAJ Inject 1 mg into the skin as needed (low blood sugar with imapoired consciousness).   insulin  aspart (NOVOLOG ) 100 UNIT/ML injection Inject 2-5 Units into the skin 3 (three) times daily with meals.   insulin  degludec (TRESIBA  FLEXTOUCH) 200 UNIT/ML FlexTouch Pen INJECT 45 UNITS EVERY DAY   lisinopril  (ZESTRIL ) 20 MG tablet TAKE ONE TABLET ONCE DAILY   sildenafil  (VIAGRA ) 100 MG tablet Take 1 tablet (100 mg total) by mouth daily as needed for erectile dysfunction.   Accu-Chek Softclix Lancets lancets Use to check blood sugar 5 times per day. Dx Code: E10.8 (Patient not taking: Reported on 01/05/2024)   Alcohol Swabs (B-D SINGLE USE SWABS REGULAR) PADS USE AS DIRECTED (Patient not taking: Reported on 01/05/2024)   amLODipine  (NORVASC ) 5 MG tablet Take 1 tablet (5 mg total) by mouth daily. (Patient not taking: Reported on 01/05/2024)   Blood Glucose Calibration (ACCU-CHEK GUIDE CONTROL) LIQD USE AS DIRECTED WITH GLUCOSE METER (Patient not taking: Reported on 01/05/2024)   Blood Glucose Monitoring Suppl (ACCU-CHEK GUIDE) w/Device KIT USE AS DIRECTED (Patient not taking: Reported on 01/05/2024)   glucose blood (ACCU-CHEK AVIVA PLUS) test strip Check blood sugar 3 times a day (Patient not taking: Reported on 01/05/2024)   Insulin  Pen Needle (SURE COMFORT PEN NEEDLES) 32G X 4 MM MISC Inject 1 Needle into the skin daily. (Patient not taking: Reported on 01/05/2024)   Insulin  Syringe-Needle U-100 (DROPLET INSULIN  SYRINGE) 30G X 1/2 0.3 ML MISC USE AS DIRECTED FOUR TIMES A DAY (Patient not taking: Reported on 01/05/2024)   VITAMIN D , CHOLECALCIFEROL, PO Take 1 capsule by mouth daily.   No facility-administered encounter medications on file as of 01/05/2024.    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 s/p PRP OD  s/p PPV with laser and cryo OS 5/10   TOBACCO USE, QUIT 11/17/2006   Trigger finger    VARICOSE VEINS, LOWER EXTREMITIES 11/17/2006   Past Surgical History:  Procedure Laterality Date   CHOLECYSTECTOMY     KNEE ARTHROSCOPY     Left   PROSTATE SURGERY  10/19/2014   [facility] [place]/ Dr. Clarisse    SHOULDER SURGERY     Left   VASECTOMY     VITRECTOMY  01/19/2007   Right   VITRECTOMY     Family History  Problem Relation Age of Onset   Cancer Father    Hypertension Mother    Varicose Veins Maternal Aunt    Varicose Veins Maternal Grandmother    Social History   Occupational History   Occupation: research officer, political party  Tobacco Use   Smoking status: Never   Smokeless tobacco: Never  Vaping Use   Vaping status: Never Used  Substance and Sexual Activity   Alcohol use: Yes    Comment: 5-6/week   Drug use: No   Sexual activity: Not on file   Tobacco Counseling Counseling given: Not Answered  SDOH Screenings   Food Insecurity:  No Food Insecurity (01/05/2024)  Housing: Unknown (01/05/2024)  Transportation Needs: No Transportation Needs (01/05/2024)  Utilities: Not At Risk (01/05/2024)  Depression (PHQ2-9): Low Risk (01/05/2024)  Physical Activity: Sufficiently Active (01/05/2024)  Social Connections: Socially Integrated (01/05/2024)  Stress: No Stress Concern Present (01/05/2024)  Tobacco Use: Low Risk (01/05/2024)  Recent Concern: Tobacco Use - Medium Risk (01/03/2024)   Received from [facility]  Health Literacy: Adequate Health Literacy (01/05/2024)   See flowsheets for full screening details  Depression Screen PHQ 2 & 9 Depression Scale- Over the past 2  weeks, how often have you been bothered by any of the following problems? Little interest or pleasure in doing things: 0 Feeling down, depressed, or hopeless (PHQ Adolescent also includes...irritable): 0 PHQ-2 Total Score: 0 Trouble falling or staying asleep, or sleeping too much: 0 Feeling tired or having little energy: 0 Poor appetite or overeating (PHQ Adolescent also includes...weight loss): 0 Feeling bad about yourself - or that you are a failure or have let yourself or your family down: 0 Trouble concentrating on things, such as reading the newspaper or watching television (PHQ Adolescent also includes...like school work): 0 Moving or speaking so slowly that other people could have noticed. Or the opposite - being so fidgety or restless that you have been moving around a lot more than usual: 0 Thoughts that you would be better off dead, or of hurting yourself in some way: 0 PHQ-9 Total Score: 0 If you checked off any problems, how difficult have these problems made it for you to do your work, take care of things at home, or get along with other people?: Not difficult at all  Depression Treatment Depression Interventions/Treatment : EYV7-0 Score <4 Follow-up Not Indicated     Goals Addressed               This Visit's Progress     Patient Stated (pt-stated)        Would like to lose some weight/2025             Objective:    Today's Vitals   01/05/24 1022  BP: 138/80  Pulse: 68  SpO2: 98%  Weight: 208 lb 12.8 oz (94.7 kg)  Height: 5' 7.25 (1.708 m)   Body mass index is 32.46 kg/m.  Hearing/Vision screen Hearing Screening - Comments:: Denies hearing difficulties   Vision Screening - Comments:: Wears eyeglasses/[place]/UTD/ Immunizations and Health Maintenance Health Maintenance  Topic Date Due   Influenza Vaccine  08/19/2023   COVID-19 Vaccine (4 - 2025-26 season) 09/19/2023   FOOT EXAM  12/20/2023   OPHTHALMOLOGY EXAM  12/20/2023   Diabetic kidney  evaluation - eGFR measurement  01/06/2024   HEMOGLOBIN A1C  05/20/2024   Fecal DNA (Cologuard)  06/06/2024   Diabetic kidney evaluation - Urine ACR  11/20/2024   Medicare Annual Wellness (AWV)  01/04/2025   DTaP/Tdap/Td (3 - Td or Tdap) 11/16/2028   Pneumococcal Vaccine: 50+ Years  Completed   Hepatitis C Screening  Completed   Zoster Vaccines- Shingrix  Completed   Meningococcal B Vaccine  Aged Out   Colonoscopy  Discontinued        Assessment/Plan:  This is a routine wellness examination for [place].  Patient Care Team: Norleen Lynwood ORN, MD as PCP - General (Internal Medicine) Kate Lonni CROME, MD as PCP - Cardiology (Cardiology) Livingston Rigg, MD as Consulting Physician (Dermatology) Bond, Reyes Mcardle, MD as Referring Physician (Ophthalmology) Cheree Banks, MD as Referring Physician (Ophthalmology)  I have personally  reviewed and noted the following in the patients chart:   Medical and social history Use of alcohol, tobacco or illicit drugs  Current medications and supplements including opioid prescriptions. Functional ability and status Nutritional status Physical activity Advanced directives List of other physicians Hospitalizations, surgeries, and ER visits in previous 12 months Vitals Screenings to include cognitive, depression, and falls Referrals and appointments  No orders of the defined types were placed in this encounter.  In addition, I have reviewed and discussed with patient certain preventive protocols, quality metrics, and best practice recommendations. A written personalized care plan for preventive services as well as general preventive health recommendations were provided to patient.   Eilan Mcinerny L Verlena Marlette, CMA   01/05/2024   Return in 1 year (on 01/04/2025).  After Visit Summary: (MyChart) Due to this being a telephonic visit, the after visit summary with patients personalized plan was offered to patient via MyChart   Nurse Notes: Patient is due  for a flu vaccine and would like to get that tomorrow during his office visit.  He is also due for a foot exam and a eGFR, which will be done during his office visit tomorrow.  Patient stated that he has had a diabetic eye exam x 2 weeks ago.  I have sent a request for records out today.  He had no other concerns to address today.

## 2024-01-05 NOTE — Patient Instructions (Signed)
 Donald Lopez,  Thank you for taking the time for your Medicare Wellness Visit. I appreciate your continued commitment to your health goals. Please review the care plan we discussed, and feel free to reach out if I can assist you further.  Please note that Annual Wellness Visits do not include a physical exam. Some assessments may be limited, especially if the visit was conducted virtually. If needed, we may recommend an in-person follow-up with your provider.  Ongoing Care Seeing your primary care provider every 3 to 6 months helps us  monitor your health and provide consistent, personalized care. Next office visit on 01/06/2024.  You are due for a flu vaccine and can get that done tomorrow during your office visit along with a foot exam.  You are also due for a kidney evaluation and will have that done during your visit as well.    Referrals If a referral was made during today's visit and you haven't received any updates within two weeks, please contact the referred provider directly to check on the status.  Recommended Screenings:  Health Maintenance  Topic Date Due   Medicare Annual Wellness Visit  Never done   Flu Shot  08/19/2023   COVID-19 Vaccine (4 - 2025-26 season) 09/19/2023   Complete foot exam   12/20/2023   Eye exam for diabetics  12/20/2023   Yearly kidney function blood test for diabetes  01/06/2024   Hemoglobin A1C  05/20/2024   Cologuard (Stool DNA test)  06/06/2024   Yearly kidney health urinalysis for diabetes  11/20/2024   DTaP/Tdap/Td vaccine (3 - Td or Tdap) 11/16/2028   Pneumococcal Vaccine for age over 47  Completed   Hepatitis C Screening  Completed   Zoster (Shingles) Vaccine  Completed   Meningitis B Vaccine  Aged Out   Colon Cancer Screening  Discontinued       01/05/2024   10:09 AM  Advanced Directives  Does Patient Have a Medical Advance Directive? Yes  Type of Estate Agent of [place];Living will  Copy of Healthcare Power  of Attorney in Chart? No - copy requested    Vision: Annual vision screenings are recommended for early detection of glaucoma, cataracts, and diabetic retinopathy. These exams can also reveal signs of chronic conditions such as diabetes and high blood pressure.  Dental: Annual dental screenings help detect early signs of oral cancer, gum disease, and other conditions linked to overall health, including heart disease and diabetes.  Please see the attached documents for additional preventive care recommendations.

## 2024-01-06 ENCOUNTER — Ambulatory Visit (INDEPENDENT_AMBULATORY_CARE_PROVIDER_SITE_OTHER): Payer: BC Managed Care – PPO | Admitting: Internal Medicine

## 2024-01-06 ENCOUNTER — Encounter: Payer: Self-pay | Admitting: Internal Medicine

## 2024-01-06 VITALS — BP 122/78 | HR 67 | Temp 97.8°F | Ht 67.25 in | Wt 207.0 lb

## 2024-01-06 DIAGNOSIS — S2241XD Multiple fractures of ribs, right side, subsequent encounter for fracture with routine healing: Secondary | ICD-10-CM | POA: Diagnosis not present

## 2024-01-06 DIAGNOSIS — Z86718 Personal history of other venous thrombosis and embolism: Secondary | ICD-10-CM | POA: Diagnosis not present

## 2024-01-06 DIAGNOSIS — I872 Venous insufficiency (chronic) (peripheral): Secondary | ICD-10-CM

## 2024-01-06 DIAGNOSIS — E559 Vitamin D deficiency, unspecified: Secondary | ICD-10-CM | POA: Diagnosis not present

## 2024-01-06 DIAGNOSIS — Z8546 Personal history of malignant neoplasm of prostate: Secondary | ICD-10-CM

## 2024-01-06 DIAGNOSIS — Z23 Encounter for immunization: Secondary | ICD-10-CM | POA: Diagnosis not present

## 2024-01-06 DIAGNOSIS — E7800 Pure hypercholesterolemia, unspecified: Secondary | ICD-10-CM

## 2024-01-06 DIAGNOSIS — Z0001 Encounter for general adult medical examination with abnormal findings: Secondary | ICD-10-CM

## 2024-01-06 DIAGNOSIS — I10 Essential (primary) hypertension: Secondary | ICD-10-CM | POA: Diagnosis not present

## 2024-01-06 DIAGNOSIS — E108 Type 1 diabetes mellitus with unspecified complications: Secondary | ICD-10-CM

## 2024-01-06 DIAGNOSIS — C61 Malignant neoplasm of prostate: Secondary | ICD-10-CM

## 2024-01-06 DIAGNOSIS — E538 Deficiency of other specified B group vitamins: Secondary | ICD-10-CM | POA: Diagnosis not present

## 2024-01-06 LAB — URINALYSIS, ROUTINE W REFLEX MICROSCOPIC
Bilirubin Urine: NEGATIVE
Hgb urine dipstick: NEGATIVE
Ketones, ur: NEGATIVE
Leukocytes,Ua: NEGATIVE
Nitrite: NEGATIVE
Specific Gravity, Urine: 1.01 (ref 1.000–1.030)
Total Protein, Urine: NEGATIVE
Urine Glucose: NEGATIVE
Urobilinogen, UA: 0.2 (ref 0.0–1.0)
pH: 6.5 (ref 5.0–8.0)

## 2024-01-06 LAB — BASIC METABOLIC PANEL WITH GFR
BUN: 15 mg/dL (ref 6–23)
CO2: 31 meq/L (ref 19–32)
Calcium: 9 mg/dL (ref 8.4–10.5)
Chloride: 104 meq/L (ref 96–112)
Creatinine, Ser: 0.79 mg/dL (ref 0.40–1.50)
GFR: 92.77 mL/min
Glucose, Bld: 79 mg/dL (ref 70–99)
Potassium: 4.1 meq/L (ref 3.5–5.1)
Sodium: 141 meq/L (ref 135–145)

## 2024-01-06 LAB — HEPATIC FUNCTION PANEL
ALT: 15 U/L (ref 3–53)
AST: 15 U/L (ref 5–37)
Albumin: 3.9 g/dL (ref 3.5–5.2)
Alkaline Phosphatase: 70 U/L (ref 39–117)
Bilirubin, Direct: 0.1 mg/dL (ref 0.1–0.3)
Total Bilirubin: 0.5 mg/dL (ref 0.2–1.2)
Total Protein: 6.4 g/dL (ref 6.0–8.3)

## 2024-01-06 LAB — CBC WITH DIFFERENTIAL/PLATELET
Basophils Absolute: 0.1 K/uL (ref 0.0–0.1)
Basophils Relative: 1 % (ref 0.0–3.0)
Eosinophils Absolute: 0.3 K/uL (ref 0.0–0.7)
Eosinophils Relative: 3.7 % (ref 0.0–5.0)
HCT: 43.3 % (ref 39.0–52.0)
Hemoglobin: 14.6 g/dL (ref 13.0–17.0)
Lymphocytes Relative: 27.9 % (ref 12.0–46.0)
Lymphs Abs: 2.5 K/uL (ref 0.7–4.0)
MCHC: 33.8 g/dL (ref 30.0–36.0)
MCV: 91.6 fl (ref 78.0–100.0)
Monocytes Absolute: 0.7 K/uL (ref 0.1–1.0)
Monocytes Relative: 7.4 % (ref 3.0–12.0)
Neutro Abs: 5.3 K/uL (ref 1.4–7.7)
Neutrophils Relative %: 60 % (ref 43.0–77.0)
Platelets: 233 K/uL (ref 150.0–400.0)
RBC: 4.73 Mil/uL (ref 4.22–5.81)
RDW: 13.7 % (ref 11.5–15.5)
WBC: 8.9 K/uL (ref 4.0–10.5)

## 2024-01-06 LAB — VITAMIN D 25 HYDROXY (VIT D DEFICIENCY, FRACTURES): VITD: 26.72 ng/mL — ABNORMAL LOW (ref 30.00–100.00)

## 2024-01-06 LAB — LIPID PANEL
Cholesterol: 136 mg/dL (ref 28–200)
HDL: 47.1 mg/dL
LDL Cholesterol: 78 mg/dL (ref 10–99)
NonHDL: 88.83
Total CHOL/HDL Ratio: 3
Triglycerides: 52 mg/dL (ref 10.0–149.0)
VLDL: 10.4 mg/dL (ref 0.0–40.0)

## 2024-01-06 LAB — PSA: PSA: 0 ng/mL — ABNORMAL LOW (ref 0.10–4.00)

## 2024-01-06 LAB — TSH: TSH: 1.18 u[IU]/mL (ref 0.35–5.50)

## 2024-01-06 LAB — MICROALBUMIN / CREATININE URINE RATIO
Creatinine,U: 71 mg/dL
Microalb Creat Ratio: UNDETERMINED mg/g (ref 0.0–30.0)
Microalb, Ur: <0.7 mg/dL

## 2024-01-06 LAB — VITAMIN B12: Vitamin B-12: 450 pg/mL (ref 211–911)

## 2024-01-06 LAB — HEMOGLOBIN A1C: Hgb A1c MFr Bld: 7.4 % — ABNORMAL HIGH (ref 4.6–6.5)

## 2024-01-06 NOTE — Progress Notes (Unsigned)
 Patient ID: Donald Lopez, male   DOB: August 31, 1955, 68 y.o.   MRN: [id number]         Chief Complaint:: yearly exam       HPI:  ATLEE KLUTH is a 68 y.o. male here overall doing ok, but did have recent fall with 2 nondisplaced right rib fx, will need f/u imaging at 6 wks per pt.  Pt denies chest pain, increased sob or doe, wheezing, orthopnea, PND, increased LE swelling, palpitations, dizziness or syncope.   Pt denies polydipsia, polyuria, or new focal neuro s/s.   Has numerous small varicose veins with discomfort, asks for f/u referral to vascular surgury.  Denies urinary symptoms such as dysuria, frequency, urgency, flank pain, hematuria or n/v, fever, chills. For flu shot today Wt Readings from Last 3 Encounters:  01/06/24 207 lb (93.9 kg)  01/05/24 208 lb 12.8 oz (94.7 kg)  11/21/23 204 lb (92.5 kg)   BP Readings from Last 3 Encounters:  01/06/24 122/78  01/05/24 138/80  11/21/23 120/70   Immunization History  Administered Date(s) Administered   Fluad Trivalent(High Dose 65+) 01/06/2023   INFLUENZA, HIGH DOSE SEASONAL PF 01/06/2024   Influenza Whole 11/10/2007, 10/10/2009   Influenza, Quadrivalent, Recombinant, Inj, Pf 10/14/2018   Influenza,inj,Quad PF,6+ Mos 10/09/2012, 11/19/2013, 02/25/2017, 12/10/2019, 11/24/2020, 01/04/2022   PFIZER(Purple Top)SARS-COV-2 Vaccination 03/24/2019, 04/14/2019, 09/25/2019   PNEUMOCOCCAL CONJUGATE-20 12/15/2020   Pneumococcal Polysaccharide-23 09/10/2011   Td 06/24/2008   Tdap 11/17/2018   Zoster Recombinant(Shingrix) 12/12/2019, 03/10/2020   There are no preventive care reminders to display for this patient.     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 s/p PRP OD  s/p PPV with laser and cryo OS 5/10   TOBACCO USE, QUIT 11/17/2006   Trigger finger    VARICOSE VEINS, LOWER EXTREMITIES 11/17/2006   Past Surgical History:  Procedure Laterality Date   CHOLECYSTECTOMY     KNEE ARTHROSCOPY     Left   PROSTATE SURGERY  10/19/2014   [facility] [place]/ Dr. Clarisse    SHOULDER SURGERY     Left   VASECTOMY     VITRECTOMY  01/19/2007   Right   VITRECTOMY      reports that he has never smoked. He has never used smokeless tobacco. He reports current alcohol use. He reports that he does not use drugs. family history includes Cancer in his father; Hypertension in his mother; Varicose Veins in his maternal aunt and maternal grandmother. Allergies[1] Medications Ordered Prior to Encounter[2]      ROS:  All others reviewed and negative.  Objective        PE:  BP 122/78 (BP Location: Right Arm, Patient Position: Sitting, Cuff Size: Normal)   Pulse 67   Temp 97.8 F (36.6 C) (Oral)   Ht 5' 7.25 (1.708 m)   Wt 207 lb (93.9 kg)   SpO2 98%   BMI 32.18 kg/m                 Constitutional: Pt appears in NAD  HENT: Head: NCAT.                Right Ear: External ear normal.                 Left Ear: External ear normal.                Eyes: . Pupils are equal, round, and reactive to light. Conjunctivae and EOM are normal               Nose: without d/c or deformity               Neck: Neck supple. Gross normal ROM               Cardiovascular: Normal rate and regular rhythm.                 Pulmonary/Chest: Effort normal and breath sounds without rales or wheezing.                Abd:  Soft, NT, ND, + BS, no organomegaly               Neurological: Pt is alert. At baseline orientation, motor grossly intact               Skin: Skin is warm. No  rashes, no other new lesions, LE edema - none               Psychiatric: Pt behavior is normal without agitation   Micro: none  Cardiac tracings I have personally interpreted today:  none  Pertinent Radiological findings (summarize): none   Lab Results  Component Value Date   WBC 8.9 01/06/2024   HGB 14.6 01/06/2024   HCT 43.3 01/06/2024   PLT 233.0 01/06/2024   GLUCOSE 79 01/06/2024   CHOL 136 01/06/2024   TRIG 52.0 01/06/2024   HDL 47.10 01/06/2024   LDLDIRECT 123.2 01/22/2013   LDLCALC 78 01/06/2024   ALT 15 01/06/2024   AST 15 01/06/2024   NA 141 01/06/2024   K 4.1 01/06/2024   CL 104 01/06/2024   CREATININE 0.79 01/06/2024   BUN 15 01/06/2024   CO2 31 01/06/2024   TSH 1.18 01/06/2024   PSA 0.00 (L) 01/06/2024   HGBA1C 7.4 (H) 01/06/2024   MICROALBUR <0.7 01/06/2024   Assessment/Plan:  DONA KLEMANN is a 68 y.o. White or Caucasian [1] male with  has a past medical history of Cancer (HCC), Cancer of prostate with intermediate recurrence risk (stage T2b-c or Gleason 7 or PSA 10-20) (HCC) (07/30/2014), DIABETES MELLITUS, TYPE I (11/17/2006), DIABETIC  RETINOPATHY (11/17/2006), HPV (11/17/2006), HYPERLIPIDEMIA (11/17/2006), Ischemic optic neuropathy, right (01/21/2011), POLYP, GALLBLADDER (11/17/2006), Proliferative diabetic retinopathy(362.02) (08/26/2011), TOBACCO USE, QUIT (11/17/2006), Trigger finger, and VARICOSE VEINS, LOWER EXTREMITIES (11/17/2006).  Cancer of prostate with intermediate recurrence risk (stage T2b-c or Gleason 7 or PSA 10-20) (HCC) Asympt  for f/u psa today  Vitamin D  deficiency Last vitamin D  Lab Results  Component Value Date   VD25OH 26.72 (L) 01/06/2024   Low, to start oral replacement   Type 1 diabetes mellitus with complications (HCC) Lab Results  Component Value Date   HGBA1C 7.4 (H) 01/06/2024  With retinopathy Stable, pt to continue current medical treatment novolog  SSI tid ac, tresiba  45 u qd    HTN (hypertension) BP Readings  from Last 3 Encounters:  01/06/24 122/78  01/05/24 138/80  11/21/23 120/70   Stable, pt to continue medical treatment norvasc  5 every day, lisinopril  20  qd    HLD (hyperlipidemia) Lab Results  Component Value Date   LDLCALC 78 01/06/2024   uncontrolled pt to continue current statin lipitor 80 mg every day, declines other change today   Chronic venous insufficiency With discomfort - for vascular surgury referral  B12 deficiency Lab Results  Component Value Date   VITAMINB12 450 01/06/2024   Stable, cont oral replacement - b12 1000 mcg qd   Multiple closed fractures of ribs of right side For f/u cxr and rib films at 6 wks Followup: Return in about 6 months (around 07/06/2024).  Lynwood Rush, MD 01/07/2024 9:42 PM [facility]     [1]  Allergies Allergen Reactions   Penicillins     REACTION: unspecified reaction  [2]  Current Outpatient Medications on File Prior to Visit  Medication Sig Dispense Refill   ibuprofen (ADVIL) 800 MG tablet Take 800 mg by mouth.     latanoprost (XALATAN) 0.005 % ophthalmic solution Apply 1 drop to eye.     tiZANidine (ZANAFLEX) 2 MG tablet Take 2 mg by mouth 2 (two) times daily as needed.     ACCU-CHEK GUIDE test strip CHECK BLOOD SUGAR FIVE TIMES DAILY 450 strip 0   amLODipine  (NORVASC ) 5 MG tablet Take 1 tablet (5 mg total) by mouth daily. (Patient not taking: Reported on 01/05/2024) 90 tablet 3   aspirin EC 81 MG tablet Take 81 mg by mouth daily.     atorvastatin  (LIPITOR) 80 MG tablet Take 1 tablet (80 mg total) by mouth daily. 90 tablet 3   cholecalciferol (VITAMIN D3) 25 MCG (1000 UNIT) tablet Take 1,000 Units by mouth daily.     Continuous Glucose Sensor (FREESTYLE LIBRE 3 PLUS SENSOR) MISC Inject 1 Device into the skin continuous. Change every 15 days 6 each 3   Continuous Glucose Sensor (FREESTYLE LIBRE 3 SENSOR) MISC Change every 14 days 6 each 3    cyanocobalamin  (VITAMIN B12) 1000 MCG tablet Take 1,000 mcg by mouth 2 (two) times a week.     dorzolamide-timolol (COSOPT) 2-0.5 % ophthalmic solution Place 1 drop into the right eye 2 (two) times daily.     Glucagon  (GVOKE HYPOPEN  1-PACK) 1 MG/0.2ML SOAJ Inject 1 mg into the skin as needed (low blood sugar with imapoired consciousness). 0.4 mL 2   insulin  aspart (NOVOLOG ) 100 UNIT/ML injection Inject 2-5 Units into the skin 3 (three) times daily with meals. 30 mL 0   insulin  degludec (TRESIBA  FLEXTOUCH) 200 UNIT/ML FlexTouch Pen INJECT 45 UNITS EVERY DAY 44 mL 2   lisinopril  (ZESTRIL ) 20 MG tablet TAKE ONE TABLET ONCE DAILY 90 tablet 3   sildenafil  (VIAGRA ) 100 MG tablet Take 1 tablet (100 mg total) by mouth daily as needed for erectile dysfunction. 30 tablet 5   VITAMIN D , CHOLECALCIFEROL, PO Take 1 capsule by mouth daily.     No current facility-administered medications on file prior to visit.

## 2024-01-06 NOTE — Patient Instructions (Signed)
 You had the flu shot today  Please continue all other medications as before, and refills have been done if requested.  Please have the pharmacy call with any other refills you may need.  Please continue your efforts at being more active, low cholesterol diet, and weight control.  You are otherwise up to date with prevention measures today.  Please keep your appointments with your specialists as you may have planned  You will be contacted regarding the referral for: Vascular Surgury  Please go to the XRAY Department in the first floor for the x-ray testing - in 5-6 weeks  Please go to the LAB at the blood drawing area for the tests to be done  You will be contacted by phone if any changes need to be made immediately.  Otherwise, you will receive a letter about your results with an explanation, but please check with MyChart first.  Please make an Appointment to return in 6 months, or sooner if needed

## 2024-01-07 ENCOUNTER — Encounter: Payer: Self-pay | Admitting: Internal Medicine

## 2024-01-07 DIAGNOSIS — S2241XA Multiple fractures of ribs, right side, initial encounter for closed fracture: Secondary | ICD-10-CM | POA: Insufficient documentation

## 2024-01-07 NOTE — Assessment & Plan Note (Signed)
 BP Readings from Last 3 Encounters:  01/06/24 122/78  01/05/24 138/80  11/21/23 120/70   Stable, pt to continue medical treatment norvasc  5 every day, lisinopril  20 qd

## 2024-01-07 NOTE — Assessment & Plan Note (Signed)
 Last vitamin D  Lab Results  Component Value Date   VD25OH 26.72 (L) 01/06/2024   Low, to start oral replacement

## 2024-01-07 NOTE — Assessment & Plan Note (Signed)
 For f/u cxr and rib films at 6 wks

## 2024-01-07 NOTE — Assessment & Plan Note (Signed)
 Lab Results  Component Value Date   HGBA1C 7.4 (H) 01/06/2024  With retinopathy Stable, pt to continue current medical treatment novolog  SSI tid ac, tresiba  45 u qd

## 2024-01-07 NOTE — Assessment & Plan Note (Signed)
 Lab Results  Component Value Date   VITAMINB12 450 01/06/2024   Stable, cont oral replacement - b12 1000 mcg qd

## 2024-01-07 NOTE — Assessment & Plan Note (Signed)
 Lab Results  Component Value Date   LDLCALC 78 01/06/2024   uncontrolled pt to continue current statin lipitor 80 mg every day, declines other change today

## 2024-01-07 NOTE — Assessment & Plan Note (Signed)
 With discomfort - for vascular surgury referral

## 2024-01-07 NOTE — Assessment & Plan Note (Signed)
 Asympt  for f/u psa today

## 2024-01-09 ENCOUNTER — Ambulatory Visit: Payer: Self-pay | Admitting: Internal Medicine

## 2024-02-20 ENCOUNTER — Ambulatory Visit: Admitting: "Endocrinology

## 2024-02-23 ENCOUNTER — Other Ambulatory Visit: Payer: Self-pay | Admitting: Vascular Surgery

## 2024-02-23 DIAGNOSIS — I872 Venous insufficiency (chronic) (peripheral): Secondary | ICD-10-CM

## 2024-02-23 DIAGNOSIS — I8390 Asymptomatic varicose veins of unspecified lower extremity: Secondary | ICD-10-CM

## 2024-03-21 ENCOUNTER — Ambulatory Visit (HOSPITAL_COMMUNITY)

## 2024-03-21 ENCOUNTER — Ambulatory Visit

## 2024-03-22 ENCOUNTER — Ambulatory Visit: Admitting: "Endocrinology
